# Patient Record
Sex: Female | Born: 1975 | Race: White | Hispanic: Yes | Marital: Single | Smoking: Former smoker
Health system: Southern US, Community
[De-identification: ages and names within clinical notes are randomized; demographics above are authoritative.]

## PROBLEM LIST (undated history)

## (undated) DIAGNOSIS — T783XXA Angioneurotic edema, initial encounter: Secondary | ICD-10-CM

## (undated) DIAGNOSIS — E162 Hypoglycemia, unspecified: Secondary | ICD-10-CM

## (undated) DIAGNOSIS — N200 Calculus of kidney: Secondary | ICD-10-CM

## (undated) DIAGNOSIS — Z9109 Other allergy status, other than to drugs and biological substances: Secondary | ICD-10-CM

## (undated) DIAGNOSIS — G43909 Migraine, unspecified, not intractable, without status migrainosus: Secondary | ICD-10-CM

## (undated) DIAGNOSIS — M545 Low back pain, unspecified: Secondary | ICD-10-CM

## (undated) DIAGNOSIS — E559 Vitamin D deficiency, unspecified: Secondary | ICD-10-CM

## (undated) DIAGNOSIS — E282 Polycystic ovarian syndrome: Secondary | ICD-10-CM

## (undated) DIAGNOSIS — M459 Ankylosing spondylitis of unspecified sites in spine: Secondary | ICD-10-CM

## (undated) DIAGNOSIS — Z9884 Bariatric surgery status: Secondary | ICD-10-CM

## (undated) DIAGNOSIS — E041 Nontoxic single thyroid nodule: Secondary | ICD-10-CM

## (undated) DIAGNOSIS — L509 Urticaria, unspecified: Secondary | ICD-10-CM

## (undated) DIAGNOSIS — K219 Gastro-esophageal reflux disease without esophagitis: Secondary | ICD-10-CM

## (undated) DIAGNOSIS — J45909 Unspecified asthma, uncomplicated: Secondary | ICD-10-CM

## (undated) DIAGNOSIS — F419 Anxiety disorder, unspecified: Secondary | ICD-10-CM

## (undated) DIAGNOSIS — M1990 Unspecified osteoarthritis, unspecified site: Secondary | ICD-10-CM

## (undated) DIAGNOSIS — Z862 Personal history of diseases of the blood and blood-forming organs and certain disorders involving the immune mechanism: Secondary | ICD-10-CM

## (undated) DIAGNOSIS — R7989 Other specified abnormal findings of blood chemistry: Secondary | ICD-10-CM

## (undated) HISTORY — PX: ADENOIDECTOMY: SUR15

## (undated) HISTORY — DX: Angioneurotic edema, initial encounter: T78.3XXA

## (undated) HISTORY — DX: Unspecified asthma, uncomplicated: J45.909

## (undated) HISTORY — DX: Nontoxic single thyroid nodule: E04.1

## (undated) HISTORY — DX: Personal history of diseases of the blood and blood-forming organs and certain disorders involving the immune mechanism: Z86.2

## (undated) HISTORY — DX: Anxiety disorder, unspecified: F41.9

## (undated) HISTORY — DX: Low back pain, unspecified: M54.50

## (undated) HISTORY — DX: Other allergy status, other than to drugs and biological substances: Z91.09

## (undated) HISTORY — DX: Urticaria, unspecified: L50.9

## (undated) HISTORY — PX: TONSILLECTOMY: SUR1361

## (undated) HISTORY — DX: Vitamin D deficiency, unspecified: E55.9

## (undated) HISTORY — DX: Polycystic ovarian syndrome: E28.2

## (undated) HISTORY — DX: Unspecified osteoarthritis, unspecified site: M19.90

## (undated) HISTORY — DX: Ankylosing spondylitis of unspecified sites in spine: M45.9

## (undated) HISTORY — DX: Hypoglycemia, unspecified: E16.2

## (undated) HISTORY — DX: Calculus of kidney: N20.0

## (undated) HISTORY — DX: Migraine, unspecified, not intractable, without status migrainosus: G43.909

## (undated) HISTORY — DX: Other specified abnormal findings of blood chemistry: R79.89

## (undated) HISTORY — DX: Gastro-esophageal reflux disease without esophagitis: K21.9

## (undated) HISTORY — DX: Bariatric surgery status: Z98.84

## (undated) HISTORY — PX: GASTRIC BYPASS: SHX52

---
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
## 1999-03-09 HISTORY — PX: KNEE SURGERY: SHX244

## 2004-03-08 DIAGNOSIS — G932 Benign intracranial hypertension: Secondary | ICD-10-CM

## 2004-03-08 HISTORY — DX: Benign intracranial hypertension: G93.2

## 2012-03-08 HISTORY — PX: GASTRIC BYPASS: SHX52

## 2013-03-08 HISTORY — PX: COSMETIC SURGERY: SHX468

## 2018-10-07 HISTORY — PX: TOTAL ABDOMINAL HYSTERECTOMY: SHX209

## 2018-10-07 HISTORY — PX: ABDOMINAL HYSTERECTOMY: SHX81

## 2019-03-09 HISTORY — PX: LITHOTRIPSY: SUR834

## 2019-11-28 ENCOUNTER — Encounter: Payer: Self-pay | Admitting: Family Medicine

## 2019-11-28 ENCOUNTER — Other Ambulatory Visit: Payer: Self-pay

## 2019-11-28 ENCOUNTER — Ambulatory Visit (INDEPENDENT_AMBULATORY_CARE_PROVIDER_SITE_OTHER): Payer: Medicare Other | Admitting: Family Medicine

## 2019-11-28 VITALS — BP 105/73 | HR 73 | Temp 96.0°F | Ht 64.0 in | Wt 249.6 lb

## 2019-11-28 DIAGNOSIS — E041 Nontoxic single thyroid nodule: Secondary | ICD-10-CM

## 2019-11-28 DIAGNOSIS — K219 Gastro-esophageal reflux disease without esophagitis: Secondary | ICD-10-CM

## 2019-11-28 DIAGNOSIS — M545 Low back pain, unspecified: Secondary | ICD-10-CM

## 2019-11-28 DIAGNOSIS — J4520 Mild intermittent asthma, uncomplicated: Secondary | ICD-10-CM

## 2019-11-28 DIAGNOSIS — F411 Generalized anxiety disorder: Secondary | ICD-10-CM

## 2019-11-28 DIAGNOSIS — E559 Vitamin D deficiency, unspecified: Secondary | ICD-10-CM

## 2019-11-28 DIAGNOSIS — F1290 Cannabis use, unspecified, uncomplicated: Secondary | ICD-10-CM

## 2019-11-28 DIAGNOSIS — M459 Ankylosing spondylitis of unspecified sites in spine: Secondary | ICD-10-CM | POA: Diagnosis not present

## 2019-11-28 DIAGNOSIS — Z9109 Other allergy status, other than to drugs and biological substances: Secondary | ICD-10-CM

## 2019-11-28 DIAGNOSIS — N941 Unspecified dyspareunia: Secondary | ICD-10-CM

## 2019-11-28 DIAGNOSIS — E282 Polycystic ovarian syndrome: Secondary | ICD-10-CM

## 2019-11-28 DIAGNOSIS — R7989 Other specified abnormal findings of blood chemistry: Secondary | ICD-10-CM

## 2019-11-28 DIAGNOSIS — R232 Flushing: Secondary | ICD-10-CM

## 2019-11-28 DIAGNOSIS — G43109 Migraine with aura, not intractable, without status migrainosus: Secondary | ICD-10-CM

## 2019-11-28 MED ORDER — FLUOXETINE HCL 20 MG PO CAPS: 20.0000 mg | ORAL_CAPSULE | Freq: Every day | ORAL | 2 refills | Status: DC

## 2019-11-28 MED ORDER — NARATRIPTAN HCL 2.5 MG PO TABS: 2.5000 mg | ORAL_TABLET | ORAL | 1 refills | Status: DC | PRN
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
## 2019-11-28 NOTE — Patient Instructions (Addendum)
Decrease Lexapro to 10 mg once daily x1 week, then stop it and start Prozac 20 mg once daily.   [employer] or [employer] for hot flashes.

## 2019-11-29 LAB — CMP14+EGFR
ALT: 142 IU/L — ABNORMAL HIGH (ref 0–32)
AST: 46 IU/L — ABNORMAL HIGH (ref 0–40)
Albumin/Globulin Ratio: 1.3 (ref 1.2–2.2)
Albumin: 3.8 g/dL (ref 3.8–4.8)
Alkaline Phosphatase: 180 IU/L — ABNORMAL HIGH (ref 44–121)
BUN/Creatinine Ratio: 15 (ref 9–23)
BUN: 11 mg/dL (ref 6–24)
Bilirubin Total: 0.6 mg/dL (ref 0.0–1.2)
CO2: 21 mmol/L (ref 20–29)
Calcium: 8.8 mg/dL (ref 8.7–10.2)
Chloride: 106 mmol/L (ref 96–106)
Creatinine, Ser: 0.75 mg/dL (ref 0.57–1.00)
GFR calc Af Amer: 114 mL/min/{1.73_m2} (ref >59)
GFR calc non Af Amer: 99 mL/min/{1.73_m2} (ref >59)
Globulin, Total: 2.9 g/dL (ref 1.5–4.5)
Glucose: 75 mg/dL (ref 65–99)
Potassium: 4.1 mmol/L (ref 3.5–5.2)
Sodium: 140 mmol/L (ref 134–144)
Total Protein: 6.7 g/dL (ref 6.0–8.5)

## 2019-11-29 LAB — THYROID PANEL WITH TSH
Free Thyroxine Index: 1.6 (ref 1.2–4.9)
T3 Uptake Ratio: 27 % (ref 24–39)
T4, Total: 6.1 ug/dL (ref 4.5–12.0)
TSH: 2.64 u[IU]/mL (ref 0.450–4.500)

## 2019-11-29 LAB — CBC WITH DIFFERENTIAL/PLATELET
Basophils Absolute: 0 10*3/uL (ref 0.0–0.2)
Basos: 0 %
EOS (ABSOLUTE): 0.1 10*3/uL (ref 0.0–0.4)
Eos: 2 %
Hematocrit: 45.6 % (ref 34.0–46.6)
Hemoglobin: 14.4 g/dL (ref 11.1–15.9)
Immature Grans (Abs): 0 10*3/uL (ref 0.0–0.1)
Immature Granulocytes: 0 %
Lymphocytes Absolute: 2.4 10*3/uL (ref 0.7–3.1)
Lymphs: 36 %
MCH: 28.2 pg (ref 26.6–33.0)
MCHC: 31.6 g/dL (ref 31.5–35.7)
MCV: 89 fL (ref 79–97)
Monocytes Absolute: 0.4 10*3/uL (ref 0.1–0.9)
Monocytes: 7 %
Neutrophils Absolute: 3.6 10*3/uL (ref 1.4–7.0)
Neutrophils: 55 %
Platelets: 208 10*3/uL (ref 150–450)
RBC: 5.11 x10E6/uL (ref 3.77–5.28)
RDW: 13.9 % (ref 11.7–15.4)
WBC: 6.5 10*3/uL (ref 3.4–10.8)

## 2019-11-29 LAB — LIPID PANEL
Chol/HDL Ratio: 2.2 ratio (ref 0.0–4.4)
Cholesterol, Total: 142 mg/dL (ref 100–199)
HDL: 64 mg/dL (ref >39)
LDL Chol Calc (NIH): 64 mg/dL (ref 0–99)
Triglycerides: 68 mg/dL (ref 0–149)
VLDL Cholesterol Cal: 14 mg/dL (ref 5–40)

## 2019-11-29 LAB — VITAMIN D 25 HYDROXY (VIT D DEFICIENCY, FRACTURES): Vit D, 25-Hydroxy: 32 ng/mL (ref 30.0–100.0)

## 2019-12-04 ENCOUNTER — Encounter: Payer: Self-pay | Admitting: Family

## 2019-12-04 ENCOUNTER — Other Ambulatory Visit: Payer: Self-pay

## 2019-12-04 ENCOUNTER — Ambulatory Visit (INDEPENDENT_AMBULATORY_CARE_PROVIDER_SITE_OTHER): Payer: Medicare Other | Admitting: Family

## 2019-12-04 VITALS — BP 109/76 | HR 76 | Temp 97.7°F | Ht 64.0 in | Wt 249.2 lb

## 2019-12-04 DIAGNOSIS — R21 Rash and other nonspecific skin eruption: Secondary | ICD-10-CM | POA: Diagnosis not present

## 2019-12-04 DIAGNOSIS — B351 Tinea unguium: Secondary | ICD-10-CM | POA: Diagnosis not present

## 2019-12-04 DIAGNOSIS — L439 Lichen planus, unspecified: Secondary | ICD-10-CM

## 2019-12-04 MED ORDER — PANTOPRAZOLE SODIUM 40 MG PO TBEC: 40.0000 mg | DELAYED_RELEASE_TABLET | Freq: Every day | ORAL | 3 refills | Status: DC

## 2019-12-04 MED ORDER — CLOBETASOL PROPIONATE 0.05 % EX OINT: 1.0000 "application " | TOPICAL_OINTMENT | Freq: Two times a day (BID) | CUTANEOUS | 2 refills | Status: DC

## 2019-12-04 NOTE — Patient Instructions (Signed)
Lichen Planus Lichen planus is a skin problem that causes redness, itching, small bumps, and sores. It can affect the skin in any area of the body. Some common areas affected include:  Arms, wrists, legs, or ankles.  Chest, back, or abdomen.  Genital areas such as the vulva and vagina.  Gums and inside of the mouth.  Scalp.  Fingernails or toenails. Treatment can help control the symptoms of this condition. The condition can last for a long time. It can take 6-18 months or longer for it to go away. What are the causes? The exact cause of this condition is not known. The condition is not passed from one person to another (not contagious). It may be related to an allergy, a medicine, or an autoimmune response. An autoimmune response occurs when the body's defense system (immune system) mistakenly attacks healthy tissues. What increases the risk? The following factors may make you more likely to develop this condition:  Being older than 42 years of age.  Taking certain medicines.  Having been exposed to certain dyes or chemicals.  Having hepatitis C.  Being a woman. What are the signs or symptoms? Symptoms of this condition may include:  Itching, which can be severe.  Small reddish or purple bumps on the skin. These may have flat tops and may be round or irregular shaped.  Redness or white patches on the gums or tongue.  Redness, soreness, or a burning feeling in the genital area. This may lead to pain or bleeding during sex.  Changes in the fingernails or toenails. The nails may become thin or rough. They may have ridges in them.  Redness or irritation of the eyes. This is rare. How is this diagnosed? This condition may be diagnosed based on:  A physical exam. The health care provider will examine your affected skin and check for changes inside your mouth.  Removal of a tissue sample (biopsy sample) to be looked at under a microscope. How is this treated? Treatment for  this condition may depend on the severity of symptoms. In some cases, no treatment is needed. If treatment is needed to control symptoms, it may include:  Creams or ointments (topical steroids) to help control itching and irritation.  Medicine to be taken by mouth.  Medicine to be taken by injection.  A treatment in which your skin is exposed to ultraviolet light (phototherapy).  Lozenges that you suck on to help treat sores in the mouth. Follow these instructions at home:   Take or use over-the-counter and prescription medicines only as told by your health care provider.  Use creams or ointments as told by your health care provider.  Do not scratch the affected areas of skin.  If you are a woman, be sure to keep the vaginal area as clean and dry as possible.  If you have sores in your mouth, avoid spicy and acidic foods as well as alcohol and tobacco.  Keep all follow-up visits as told by your health care provider. This is important. Contact a health care provider if:  You have increasing redness, swelling, or pain in the affected area.  You have fluid, blood, or pus coming from the affected area.  Your eyes become irritated. Summary  Lichen planus is a skin problem that causes redness, itching, small bumps, and sores. It can affect the skin in any area of the body.  Do not scratch the affected areas of skin. Keep the affected area of skin clean.  Take or use  over-the-counter and prescription medicines only as told by your health care provider.  Contact a health care provider if you have drainage from the affected area or have increasing redness, swelling, or pain in the area.  Keep all follow-up visits as told by your health care provider. This is important. This information is not intended to replace advice given to you by your health care provider. Make sure you discuss any questions you have with your health care provider. Document Revised: 07/06/2017 Document  Reviewed: 07/06/2017 Elsevier Patient Education  [place].

## 2019-12-04 NOTE — Progress Notes (Signed)
Subjective:    Patient ID: Stephanie Dyer, female    DOB: 1975/10/09, 44 y.o.   MRN: [id number]  Chief Complaint  Patient presents with  . Rash    on pelvic area, has tried otc antifoungle cream with no relief   . Nail Problem   Pt presents to the office today with rash in her groin and inner thighs that she noticed several weeks ago. She reports intense itching, but no pain,   She also report thick great toenails for years. She has tried oral Terbinafine without relief.    Rash This is a new problem. The current episode started 1 to 4 weeks ago. The affected locations include the groin (inner thighs). The rash is characterized by itchiness and redness. She was exposed to nothing. Pertinent negatives include no facial edema, fatigue, rhinorrhea or shortness of breath. Past treatments include antibiotics and anti-itch cream (anti fungal ). The treatment provided mild relief.      Review of Systems  Constitutional: Negative for fatigue.  HENT: Negative for rhinorrhea.   Respiratory: Negative for shortness of breath.   Skin: Positive for rash.  All other systems reviewed and are negative.      Objective:   Physical Exam Vitals reviewed.  Constitutional:      General: She is not in acute distress.    Appearance: She is well-developed.  HENT:     Head: Normocephalic and atraumatic.  Eyes:     Pupils: Pupils are equal, round, and reactive to light.  Neck:     Thyroid: No thyromegaly.  Cardiovascular:     Rate and Rhythm: Normal rate and regular rhythm.     Heart sounds: Normal heart sounds. No murmur heard.   Pulmonary:     Effort: Pulmonary effort is normal. No respiratory distress.     Breath sounds: Normal breath sounds. No wheezing.  Abdominal:     General: Bowel sounds are normal. There is no distension.     Palpations: Abdomen is soft.     Tenderness: There is no abdominal tenderness.  Musculoskeletal:        General: No tenderness. Normal range of motion.      Cervical back: Normal range of motion and neck supple.  Skin:    General: Skin is warm and dry.     Findings: Rash present.     Comments: Small flat, circular white patches in bilateral groin   Neurological:     Mental Status: She is alert and oriented to person, place, and time.     Cranial Nerves: No cranial nerve deficit.     Deep Tendon Reflexes: Reflexes are normal and symmetric.  Psychiatric:        Behavior: Behavior normal.        Thought Content: Thought content normal.        Judgment: Judgment normal.       BP 109/76   Pulse 76   Temp 97.7 F (36.5 C) (Temporal)   Ht 5\' 4"  (1.626 m)   Wt 249 lb 3.2 oz (113 kg)   LMP 11/04/2018   BMI 42.78 kg/m      Assessment & Plan:  Stephanie Dyer comes in today with chief complaint of Rash (on pelvic area, has tried otc antifungal cream with no relief ) and Nail Problem   Diagnosis and orders addressed:  1. Rash and nonspecific skin eruption  2. Lichen planus Looks like Lichen planus, will start clobetasol BID  Avoid scratching Good RX discussed  if costs is too much - clobetasol ointment (TEMOVATE) 0.05 %; Apply 1 application topically 2 (two) times daily.  Dispense: 60 g; Refill: 2  3. Toenail fungus - Ambulatory referral to [place], FNP

## 2019-12-06 ENCOUNTER — Encounter: Payer: Self-pay | Admitting: Family Medicine

## 2019-12-06 DIAGNOSIS — E559 Vitamin D deficiency, unspecified: Secondary | ICD-10-CM | POA: Insufficient documentation

## 2019-12-06 DIAGNOSIS — F419 Anxiety disorder, unspecified: Secondary | ICD-10-CM | POA: Insufficient documentation

## 2019-12-06 DIAGNOSIS — J45909 Unspecified asthma, uncomplicated: Secondary | ICD-10-CM | POA: Insufficient documentation

## 2019-12-06 DIAGNOSIS — E041 Nontoxic single thyroid nodule: Secondary | ICD-10-CM | POA: Insufficient documentation

## 2019-12-06 DIAGNOSIS — E282 Polycystic ovarian syndrome: Secondary | ICD-10-CM | POA: Insufficient documentation

## 2019-12-06 DIAGNOSIS — N941 Unspecified dyspareunia: Secondary | ICD-10-CM | POA: Insufficient documentation

## 2019-12-06 DIAGNOSIS — K219 Gastro-esophageal reflux disease without esophagitis: Secondary | ICD-10-CM | POA: Insufficient documentation

## 2019-12-06 DIAGNOSIS — M459 Ankylosing spondylitis of unspecified sites in spine: Secondary | ICD-10-CM | POA: Insufficient documentation

## 2019-12-06 DIAGNOSIS — F411 Generalized anxiety disorder: Secondary | ICD-10-CM | POA: Insufficient documentation

## 2019-12-06 DIAGNOSIS — M545 Low back pain, unspecified: Secondary | ICD-10-CM | POA: Insufficient documentation

## 2019-12-06 DIAGNOSIS — R7989 Other specified abnormal findings of blood chemistry: Secondary | ICD-10-CM | POA: Insufficient documentation

## 2019-12-06 DIAGNOSIS — G43909 Migraine, unspecified, not intractable, without status migrainosus: Secondary | ICD-10-CM | POA: Insufficient documentation

## 2019-12-06 DIAGNOSIS — Z9109 Other allergy status, other than to drugs and biological substances: Secondary | ICD-10-CM | POA: Insufficient documentation

## 2019-12-06 DIAGNOSIS — R232 Flushing: Secondary | ICD-10-CM | POA: Insufficient documentation

## 2019-12-09 ENCOUNTER — Encounter: Payer: Self-pay | Admitting: Family Medicine

## 2019-12-09 DIAGNOSIS — F1290 Cannabis use, unspecified, uncomplicated: Secondary | ICD-10-CM | POA: Insufficient documentation

## 2019-12-09 DIAGNOSIS — L680 Hirsutism: Secondary | ICD-10-CM

## 2019-12-10 ENCOUNTER — Encounter: Payer: Self-pay | Admitting: Internal Medicine

## 2019-12-14 ENCOUNTER — Telehealth: Payer: Self-pay | Admitting: *Deleted

## 2019-12-14 ENCOUNTER — Encounter: Payer: Self-pay | Admitting: Family Medicine

## 2019-12-14 NOTE — Telephone Encounter (Signed)
Can we please ask patient if she has tried the others? She came from a neurologist in [place] and I do not have her records yet.

## 2019-12-14 NOTE — Telephone Encounter (Signed)
NARATRIPTAN HCL 2.5MG  TABLET-Non Preferred   Preferred meds -rizatriptan ODT -rizatriptan tablet -sumatriptan

## 2019-12-14 NOTE — Telephone Encounter (Signed)
I tried contacting the patient to ask and unfortunately the phone number we have for her is not working.

## 2019-12-18 NOTE — Telephone Encounter (Signed)
I sent patient a MyChart message 4 days ago since the number in her chart does not work, but have yet to hear back from her.

## 2019-12-27 ENCOUNTER — Ambulatory Visit (INDEPENDENT_AMBULATORY_CARE_PROVIDER_SITE_OTHER): Payer: Medicare Other | Admitting: Nurse Practitioner

## 2019-12-27 DIAGNOSIS — H6505 Acute serous otitis media, recurrent, left ear: Secondary | ICD-10-CM | POA: Diagnosis not present

## 2019-12-27 DIAGNOSIS — G43109 Migraine with aura, not intractable, without status migrainosus: Secondary | ICD-10-CM | POA: Diagnosis not present

## 2019-12-27 MED ORDER — FLUCONAZOLE 150 MG PO TABS: 150.0000 mg | ORAL_TABLET | Freq: Once | ORAL | 0 refills | Status: AC

## 2019-12-27 MED ORDER — TOPIRAMATE 25 MG PO CPSP: 75.0000 mg | ORAL_CAPSULE | Freq: Every evening | ORAL | 3 refills | Status: DC

## 2019-12-27 MED ORDER — AMOXICILLIN-POT CLAVULANATE 875-125 MG PO TABS: 1.0000 | ORAL_TABLET | Freq: Two times a day (BID) | ORAL | 0 refills | Status: DC

## 2019-12-27 NOTE — Progress Notes (Signed)
   Virtual Visit via telephone Note Due to COVID-19 pandemic this visit was conducted virtually. This visit type was conducted due to national recommendations for restrictions regarding the COVID-19 Pandemic (e.g. social distancing, sheltering in place) in an effort to limit this patient's exposure and mitigate transmission in our community. All issues noted in this document were discussed and addressed.  A physical exam was not performed with this format.  I connected with Stephanie Dyer on 12/27/19 at 11:10 by telephone and verified that I am speaking with the correct person using two identifiers. Stephanie Dyer is currently located at home and no one is currently with her during visit. The provider, Mary-Margaret Hassell Done, FNP is located in their office at time of visit.  I discussed the limitations, risks, security and privacy concerns of performing an evaluation and management service by telephone and the availability of in person appointments. I also discussed with the patient that there may be a patient responsible charge related to this service. The patient expressed understanding and agreed to proceed.   History and Present Illness:   Chief Complaint: Ear Pain   HPI Patient calls in stating that she has another ear infection. Sharp pain in ear. She gets frequent ear infections. She denies any drainage. Has never seen ENMT. Says she typically gets 3-4 ear infections yearly.  * needs topamax filled- was not filled at last visit  Review of Systems  Constitutional: Negative for fever.  HENT: Positive for ear pain. Negative for ear discharge.   Respiratory: Negative.   Musculoskeletal: Negative.   Neurological: Negative.   Psychiatric/Behavioral: Negative.      Observations/Objective: Alert and oriented- answers all questions appropriately No distress    Assessment and Plan: Stephanie Dyer in today with chief complaint of Ear Pain   1. Recurrent acute serous otitis media of  left ear Do not stick anything in ear Avoid getting water in ear - Ambulatory referral to ENT - amoxicillin-clavulanate (AUGMENTIN) 875-125 MG tablet; Take 1 tablet by mouth 2 (two) times daily.  Dispense: 20 tablet; Refill: 0 - fluconazole (DIFLUCAN) 150 MG tablet; Take 1 tablet (150 mg total) by mouth once for 1 dose.  Dispense: 1 tablet; Refill: 0  2. Migraine with aura and without status migrainosus, not intractable Refilled meds for PCP - topiramate (TOPAMAX) 25 MG capsule; Take 3 capsules (75 mg total) by mouth at bedtime.  Dispense: 90 capsule; Refill: 3     Follow Up Instructions: prn    I discussed the assessment and treatment plan with the patient. The patient was provided an opportunity to ask questions and all were answered. The patient agreed with the plan and demonstrated an understanding of the instructions.   The patient was advised to call back or seek an in-person evaluation if the symptoms worsen or if the condition fails to improve as anticipated.  The above assessment and management plan was discussed with the patient. The patient verbalized understanding of and has agreed to the management plan. Patient is aware to call the clinic if symptoms persist or worsen. Patient is aware when to return to the clinic for a follow-up visit. Patient educated on when it is appropriate to go to the emergency department.   Time call ended: 11:25   I provided 15 minutes of non-face-to-face time during this encounter.    Mary-Margaret Hassell Done, FNP

## 2020-01-03 ENCOUNTER — Telehealth: Payer: Self-pay | Admitting: *Deleted

## 2020-01-03 DIAGNOSIS — G43109 Migraine with aura, not intractable, without status migrainosus: Secondary | ICD-10-CM

## 2020-01-03 MED ORDER — TOPIRAMATE 25 MG PO CPSP: 75.0000 mg | ORAL_CAPSULE | Freq: Every evening | ORAL | 1 refills | Status: DC

## 2020-01-03 NOTE — Addendum Note (Signed)
Addended by: Chevis Pretty on: 01/03/2020 11:20 AM   Modules accepted: Orders

## 2020-01-03 NOTE — Telephone Encounter (Signed)
Fax from [facility] [facility] Re: Topamax Spr 25 mg cap Note from pharmacy: pt was on tablet form previously  Pt started with practice on 11/28/19, first Rxd by [place] on 12/27/19 Please advise if change is appropriate send in new script

## 2020-01-04 ENCOUNTER — Ambulatory Visit: Payer: Medicare Other | Admitting: Family Medicine

## 2020-01-09 ENCOUNTER — Encounter: Payer: Self-pay | Admitting: *Deleted

## 2020-01-09 ENCOUNTER — Encounter: Payer: Self-pay | Admitting: Internal Medicine

## 2020-01-09 ENCOUNTER — Other Ambulatory Visit: Payer: Self-pay

## 2020-01-09 ENCOUNTER — Ambulatory Visit (INDEPENDENT_AMBULATORY_CARE_PROVIDER_SITE_OTHER): Payer: Medicare Other | Admitting: Internal Medicine

## 2020-01-09 VITALS — BP 108/71 | HR 85 | Temp 96.8°F | Ht 64.0 in | Wt 239.4 lb

## 2020-01-09 DIAGNOSIS — R1319 Other dysphagia: Secondary | ICD-10-CM | POA: Diagnosis not present

## 2020-01-09 DIAGNOSIS — R945 Abnormal results of liver function studies: Secondary | ICD-10-CM

## 2020-01-09 DIAGNOSIS — R194 Change in bowel habit: Secondary | ICD-10-CM

## 2020-01-09 DIAGNOSIS — K219 Gastro-esophageal reflux disease without esophagitis: Secondary | ICD-10-CM | POA: Diagnosis not present

## 2020-01-09 DIAGNOSIS — R195 Other fecal abnormalities: Secondary | ICD-10-CM

## 2020-01-09 DIAGNOSIS — R7989 Other specified abnormal findings of blood chemistry: Secondary | ICD-10-CM

## 2020-01-09 NOTE — Progress Notes (Signed)
Primary Care Physician:  Loman Brooklyn, FNP Primary Gastroenterologist:  Dr. Abbey Chatters  Chief Complaint  Patient presents with  . Elevated Hepatic Enzymes    x1 year    HPI:   Stephanie Dyer is a 44 y.o. female who presents to the clinic today by referral from her PCP Delight Ovens for evaluations for abnormal LFTs.  Patient states she has been dealing with this for years now..  She had a full serological work-up at [facility] previously.  She is showing me the lab reports on her phone.  Appears her hepatitis B, hepatitis C serologies were negative.  ANA, AMA, A1 A, immunoglobulins, ferritin all within normal limits.  She had a biopsy performed but I do not have access to the pathology report.  Patient does not drink alcohol.  No history of drug use.  No family history of liver disease.  Does not appear that she is on any chronic medications that could cause hepatitis.  From a GI standpoint, she is also complained of chronic reflux with breakthrough symptoms.  Also notes occasional dysphagia primarily with solids getting stuck in her substernal region.  Currently takes Protonix 40 mg daily which helps some.  She is status post gastric bypass in 2014.  Also notes change in bowel habits.  Notes predominantly loose stools on a daily basis.  Does have intermittent constipation as well.  No melena or hematochezia.  No unintentional weight loss.  No family history of colorectal malignancy.  No previous colonoscopy  Past Medical History:  Diagnosis Date  . Ankylosing spondylitis ([place])   . Anxiety   . Arthritis   . Asthma   . Cervical cancer ([place]) 1998  . Elevated LFTs   . Environmental allergies   . GERD (gastroesophageal reflux disease)   . Hypoglycemia   . Kidney stones   . Lumbar back pain   . Migraines   . PCOS (polycystic ovarian syndrome)   . Thyroid cyst   . Vitamin D deficiency     Past Surgical History:  Procedure Laterality Date  . ABDOMINAL HYSTERECTOMY  10/2018  .  CESAREAN SECTION  2016  . CHOLECYSTECTOMY  1998  . COSMETIC SURGERY  2015  . GASTRIC BYPASS  2014  . KNEE SURGERY Left 2001  . LITHOTRIPSY  2021  . TONSILLECTOMY      Current Outpatient Medications  Medication Sig Dispense Refill  . albuterol (VENTOLIN HFA) 108 (90 Base) MCG/ACT inhaler Inhale 2 puffs into the lungs every 6 (six) hours as needed.    . clobetasol ointment (TEMOVATE) 1.61 % Apply 1 application topically 2 (two) times daily. 60 g 2  . cyclobenzaprine (FLEXERIL) 5 MG tablet Take 5 mg by mouth 3 (three) times daily as needed for muscle spasms.    Marland Kitchen ELDERBERRY PO Take by mouth.    . EPINEPHrine 0.3 mg/0.3 mL IJ SOAJ injection Inject 0.3 mg into the muscle as needed.    . ergocalciferol (VITAMIN D2) 1.25 MG (50000 UT) capsule Take 50,000 Units by mouth once a week.    Marland Kitchen FLUoxetine (PROZAC) 20 MG capsule Take 1 capsule (20 mg total) by mouth daily. 30 capsule 2  . Multiple Vitamins-Minerals (AIRBORNE PO) Take by mouth.    . Multiple Vitamins-Minerals (WOMENS MULTI GUMMIES) CHEW Chew by mouth.    . naratriptan (AMERGE) 2.5 MG tablet Take 1 tablet (2.5 mg total) by mouth as needed for migraine. Take one (1) tablet at onset of headache; if returns or does not  resolve, may repeat after 4 hours; do not exceed five (5) mg in 24 hours. 10 tablet 1  . OVER THE COUNTER MEDICATION Power Zinc gummy    . OVER THE COUNTER MEDICATION Power C gummy    . pantoprazole (PROTONIX) 40 MG tablet Take 1 tablet (40 mg total) by mouth daily. 90 tablet 3  . topiramate (TOPAMAX) 25 MG capsule Take 3 capsules (75 mg total) by mouth at bedtime. 90 capsule 1  . amoxicillin-clavulanate (AUGMENTIN) 875-125 MG tablet Take 1 tablet by mouth 2 (two) times daily. (Patient not taking: Reported on 01/09/2020) 20 tablet 0  . montelukast (SINGULAIR) 10 MG tablet Take 10 mg by mouth at bedtime. (Patient not taking: Reported on 01/09/2020)     No current facility-administered medications for this visit.    Allergies as  of 01/09/2020 - Review Complete 01/09/2020  Allergen Reaction Noted  . Codeine Anaphylaxis 10/06/2019  . Morphine Anaphylaxis 10/06/2019  . Other Anaphylaxis 10/06/2019    Family History  Problem Relation Age of Onset  . Alcohol abuse Mother   . Diabetes Mother   . COPD Mother   . Emphysema Mother   . Heart disease Father   . Heart attack Father   . CVA Brother   . Diabetes Brother   . Hypertension Brother   . Autism Son   . Hearing loss Son   . Diabetes Maternal Grandmother   . Parkinson's disease Maternal Grandmother   . Macular degeneration Maternal Grandmother   . Thyroid cancer Maternal Grandmother   . Stomach cancer Maternal Grandmother   . Cervical cancer Maternal Grandmother   . Suicidality Maternal Grandfather     Social History   Socioeconomic History  . Marital status: Married    Spouse name: Not on file  . Number of children: Not on file  . Years of education: Not on file  . Highest education level: Not on file  Occupational History  . Not on file  Tobacco Use  . Smoking status: Former Smoker    Types: Cigarettes    Quit date: 11/27/2012    Years since quitting: 7.1  . Smokeless tobacco: Never Used  Vaping Use  . Vaping Use: Never used  Substance and Sexual Activity  . Alcohol use: Not Currently  . Drug use: Yes    Types: Marijuana  . Sexual activity: Yes    Birth control/protection: Other-see comments  Other Topics Concern  . Not on file  Social History Narrative  . Not on file   Social Determinants of Health   Financial Resource Strain:   . Difficulty of Paying Living Expenses: Not on file  Food Insecurity:   . Worried About Charity fundraiser in the Last Year: Not on file  . Ran Out of Food in the Last Year: Not on file  Transportation Needs:   . Lack of Transportation (Medical): Not on file  . Lack of Transportation (Non-Medical): Not on file  Physical Activity:   . Days of Exercise per Week: Not on file  . Minutes of Exercise per  Session: Not on file  Stress:   . Feeling of Stress : Not on file  Social Connections:   . Frequency of Communication with Friends and Family: Not on file  . Frequency of Social Gatherings with Friends and Family: Not on file  . Attends Religious Services: Not on file  . Active Member of Clubs or Organizations: Not on file  . Attends Archivist Meetings: Not on  file  . Marital Status: Not on file  Intimate Partner Violence:   . Fear of Current or Ex-Partner: Not on file  . Emotionally Abused: Not on file  . Physically Abused: Not on file  . Sexually Abused: Not on file    Subjective: Review of Systems  Constitutional: Negative for chills and fever.  HENT: Negative for congestion and hearing loss.   Eyes: Negative for blurred vision and double vision.  Respiratory: Negative for cough and shortness of breath.   Cardiovascular: Negative for chest pain and palpitations.  Gastrointestinal: Positive for diarrhea and heartburn. Negative for abdominal pain, blood in stool, constipation, melena and vomiting.       Dysphagia  Genitourinary: Negative for dysuria and urgency.  Musculoskeletal: Negative for joint pain and myalgias.  Skin: Negative for itching and rash.  Neurological: Negative for dizziness and headaches.  Psychiatric/Behavioral: Negative for depression. The patient is not nervous/anxious.        Objective: BP 108/71   Pulse 85   Temp (!) 96.8 F (36 C) (Temporal)   Ht 5\' 4"  (1.626 m)   Wt 239 lb 6.4 oz (108.6 kg)   LMP 11/04/2018   BMI 41.09 kg/m  Physical Exam Constitutional:      Appearance: Normal appearance. She is obese.  HENT:     Head: Normocephalic and atraumatic.  Eyes:     Extraocular Movements: Extraocular movements intact.     Conjunctiva/sclera: Conjunctivae normal.  Cardiovascular:     Rate and Rhythm: Normal rate and regular rhythm.  Pulmonary:     Effort: Pulmonary effort is normal.     Breath sounds: Normal breath sounds.    Abdominal:     General: Bowel sounds are normal.     Palpations: Abdomen is soft.  Musculoskeletal:        General: No swelling. Normal range of motion.     Cervical back: Normal range of motion and neck supple.  Skin:    General: Skin is warm and dry.     Coloration: Skin is not jaundiced.  Neurological:     General: No focal deficit present.     Mental Status: She is alert and oriented to person, place, and time.  Psychiatric:        Mood and Affect: Mood normal.        Behavior: Behavior normal.      Assessment: *Abnormal LFTs-etiology unclear *Chronic reflux-breakthrough symptoms on daily Protonix *Change in bowel habits-primarily diarrhea *Dysphagia  Plan: Etiology of patient's abnormal LFTs unclear.  I will request for liver biopsy results as well as her last GI note from [facility] gastroenterology.  Appears that her serological work-up has been negative including negative hepatitis B and C serologies, ANA, AMA, immunoglobulins, A1A, iron studies.  She does have risk factors for fatty liver disease including obesity.  Further recommendations to follow after I have received her previous reports.  For her chronic reflux with dysphagia, Will schedule for EGD to evaluate for peptic ulcer disease, esophagitis, gastritis, H. Pylori, duodenitis, or other. Will also evaluate for esophageal stricture, Schatzki's ring, esophageal web or other.   At the same time we will perform colonoscopy with random biopsies to rule out underlying inflammatory bowel disease such as ulcerative colitis and Crohn's disease, microscopic colitis given her change in bowel habits and chronic diarrhea.  The risks including infection, bleed, or perforation as well as benefits, limitations, alternatives and imponderables have been reviewed with the patient. Potential for esophageal dilation, biopsy, etc. have  also been reviewed.  Questions have been answered. All parties agreeable.  Patient to follow-up after her  procedures.  Thank you Delight Ovens for the kind 01/09/2020 9:50 AM   Disclaimer: This note was dictated with voice recognition software. Similar sounding words can inadvertently be transcribed and may not be corrected upon review.

## 2020-01-09 NOTE — Patient Instructions (Signed)
To you for EGD to evaluate your chronic reflux, heartburn, and difficulty swallowing.  At the same time we will perform colonoscopy for your change in bowel habits.  I will request records from [facility] in regards to your liver biopsy.  Follow-up after procedures and we will decide what the need to do next  At [facility] we value your feedback. You may receive a survey about your visit today. Please share your experience as we strive to create trusting relationships with our patients to provide genuine, compassionate, quality care.  We appreciate your understanding and patience as we review any laboratory studies, imaging, and other diagnostic tests that are ordered as we care for you. Our office policy is 5 business days for review of these results, and any emergent or urgent results are addressed in a timely manner for your best interest. If you do not hear from our office in 1 week, please contact us.   We also encourage the use of MyChart, which contains your medical information for your review as well. If you are not enrolled in this feature, an access code is on this after visit summary for your convenience. Thank you for allowing [place] to be involved in your care.  It was great to see you today!  I hope you have a great rest of your fall!!    Yahaira Bruski K. Abbey Chatters, D.O. Gastroenterology and Hepatology [facility]

## 2020-01-14 ENCOUNTER — Encounter: Payer: Self-pay | Admitting: Family Medicine

## 2020-01-14 ENCOUNTER — Ambulatory Visit: Payer: Medicare Other | Admitting: Nurse Practitioner

## 2020-01-22 ENCOUNTER — Encounter: Payer: Self-pay | Admitting: Allergy

## 2020-01-22 ENCOUNTER — Ambulatory Visit (INDEPENDENT_AMBULATORY_CARE_PROVIDER_SITE_OTHER): Payer: Medicare Other | Admitting: Allergy

## 2020-01-22 ENCOUNTER — Other Ambulatory Visit: Payer: Self-pay

## 2020-01-22 VITALS — BP 94/70 | HR 77 | Temp 97.1°F | Resp 18 | Ht 63.15 in | Wt 240.0 lb

## 2020-01-22 DIAGNOSIS — T781XXD Other adverse food reactions, not elsewhere classified, subsequent encounter: Secondary | ICD-10-CM

## 2020-01-22 DIAGNOSIS — Z8709 Personal history of other diseases of the respiratory system: Secondary | ICD-10-CM | POA: Diagnosis not present

## 2020-01-22 DIAGNOSIS — T781XXA Other adverse food reactions, not elsewhere classified, initial encounter: Secondary | ICD-10-CM | POA: Insufficient documentation

## 2020-01-22 DIAGNOSIS — Z91038 Other insect allergy status: Secondary | ICD-10-CM | POA: Insufficient documentation

## 2020-01-22 DIAGNOSIS — Z889 Allergy status to unspecified drugs, medicaments and biological substances status: Secondary | ICD-10-CM | POA: Insufficient documentation

## 2020-01-22 DIAGNOSIS — J3089 Other allergic rhinitis: Secondary | ICD-10-CM | POA: Diagnosis not present
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
## 2020-01-22 NOTE — Assessment & Plan Note (Signed)
Hives and swelling after stings in the past. No prior work up.  Continue to avoid.  Get bloodwork.   Carry epinephrine and use for anaphylactic reactions especially when outdoors.

## 2020-01-22 NOTE — Assessment & Plan Note (Signed)
One episode of throat closure with lip turning blue after peanut exposure 2 years ago.  She is not sure of exact events as EMS was called.  She does not recall being administered epinephrine.  Milder similar symptoms with walnuts and pecans.  Tolerates pistachios with no issues.  Seafood causes nausea and diarrhea.  Avoiding fresh fruits due to natural sugars.  Eggs cause nausea, increased gas.  Wheat causes bloating.  Patient is status post gastric bypass surgery.  Today's skin testing showed: Borderline positive to pistachio. Negative to peanuts, milk, egg, seafood.  Results given.   Continue strict avoidance of peanuts and tree nuts.   Okay to eat pistachios as before.  Avoid foods that bother you - seafood, fruits.   Patient still consumes eggs and wheat. Most likely has non-IgE mediated intolerance.  Food allergen skin testing has excellent negative predictive value however there is still a small chance that the allergy exists. Therefore, we will investigate further with serum specific IgE levels for nuts and seafood and, if negative then schedule for open graded oral food challenge.  For mild symptoms you can take over the counter antihistamines such as Benadryl and monitor symptoms closely. If symptoms worsen or if you have severe symptoms including breathing issues, throat closure, significant swelling, whole body hives, severe diarrhea and vomiting, lightheadedness then inject epinephrine and seek immediate medical care afterwards.  Emergency action plan given.

## 2020-01-22 NOTE — Assessment & Plan Note (Addendum)
·   Continue to avoid codeine and morphine.
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
## 2020-01-22 NOTE — Assessment & Plan Note (Signed)
Perennial rhinoconjunctivitis symptoms for 40+ years with worsening in the spring and summer.  Patient moved from [place] to [place] in July 2021.  No worsening symptoms.  Patient was on allergy immunotherapy for 2 years with good benefit.  Requesting records from previous allergist.  Will hold off skin testing and restarting allergy injections at this time as the flora in this region is different than [place] and she has not been exposed to them.   Continue environmental control measures as below.  May use over the counter antihistamines such as Zyrtec (cetirizine), Claritin (loratadine), Allegra (fexofenadine), or Xyzal (levocetirizine) daily as needed. May take twice a day if needed.  May use Flonase (fluticasone) nasal spray 1 spray per nostril twice a day as needed for nasal congestion.  May take Singulair (montelukast) 10mg  daily at night if needed.   Nasal saline spray (i.e., Simply Saline) or nasal saline lavage (i.e., NeilMed) is recommended as needed and prior to medicated nasal sprays.

## 2020-01-22 NOTE — Patient Instructions (Addendum)
I will review records from your previous allergist.  Today's skin testing showed:  Borderline positive to pistachio.   Negative to peanuts, milk, egg, seafood.    Results given.   Environmental allergies  Start environmental control measures as below.  May use over the counter antihistamines such as Zyrtec (cetirizine), Claritin (loratadine), Allegra (fexofenadine), or Xyzal (levocetirizine) daily as needed. May take twice a day if needed.  May use Flonase (fluticasone) nasal spray 1 spray per nostril twice a day as needed for nasal congestion.  May take Singulair (montelukast) 10mg  daily at night if needed.   Nasal saline spray (i.e., Simply Saline) or nasal saline lavage (i.e., NeilMed) is recommended as needed and prior to medicated nasal sprays.  If symptoms worse in [place], we will retest and start allergy injections.    Food:  Continue strict avoidance of peanuts and tree nuts.   Okay to eat pistachios as before.  Avoid foods that bother you - seafood, fruits.  Food allergen skin testing has excellent negative predictive value however there is still a small chance that the allergy exists. Therefore, we will investigate further with serum specific IgE levels for nuts and seafood and, if negative then schedule for open graded oral food challenge.  For mild symptoms you can take over the counter antihistamines such as Benadryl and monitor symptoms closely. If symptoms worsen or if you have severe symptoms including breathing issues, throat closure, significant swelling, whole body hives, severe diarrhea and vomiting, lightheadedness then inject epinephrine and seek immediate medical care afterwards.  Emergency action plan given.   Asthma:  May use albuterol rescue inhaler 2 puffs every 4 to 6 hours as needed for shortness of breath, chest tightness, coughing, and wheezing. May use albuterol rescue inhaler 2 puffs 5 to 15 minutes prior to strenuous physical activities. Monitor  frequency of use.   Drug allergies:  Continue to avoid codeine and morphine.  Bee sting reaction:  Continue to avoid.  Get bloodwork.   Carry epinephrine with you especially when outdoors.   Frequent ear infections:  Follow up with ENT.  Follow up in 6 months or sooner if needed.   Pet Allergen Avoidance: . Contrary to popular opinion, there are no "hypoallergenic" breeds of dogs or cats. That is because people are not allergic to an animal's hair, but to an allergen found in the animal's saliva, dander (dead skin flakes) or urine. Pet allergy symptoms typically occur within minutes. For some people, symptoms can build up and become most severe 8 to 12 hours after contact with the animal. People with severe allergies can experience reactions in public places if dander has been transported on the pet owners' clothing. Marland Kitchen Keeping an animal outdoors is only a partial solution, since homes with pets in the yard still have higher concentrations of animal allergens. . Before getting a pet, ask your allergist to determine if you are allergic to animals. If your pet is already considered part of your family, try to minimize contact and keep the pet out of the bedroom and other rooms where you spend a great deal of time. . As with dust mites, vacuum carpets often or replace carpet with a hardwood floor, tile or linoleum. . High-efficiency particulate air (HEPA) cleaners can reduce allergen levels over time. . While dander and saliva are the source of cat and dog allergens, urine is the source of allergens from rabbits, hamsters, mice and [place] pigs; so ask a non-allergic family member to clean the animal's  cage. . If you have a pet allergy, talk to your allergist about the potential for allergy immunotherapy (allergy shots). This strategy can often provide long-term relief.  Reducing Pollen Exposure . Pollen seasons: trees (spring), grass (summer) and ragweed/weeds (fall). Marland Kitchen Keep windows closed  in your home and car to lower pollen exposure.  Susa Simmonds air conditioning in the bedroom and throughout the house if possible.  . Avoid going out in dry windy days - especially early morning. . Pollen counts are highest between 5 - 10 AM and on dry, hot and windy days.  . Save outside activities for late afternoon or after a heavy rain, when pollen levels are lower.  . Avoid mowing of grass if you have grass pollen allergy. Marland Kitchen Be aware that pollen can also be transported indoors on people and pets.  . Dry your clothes in an automatic dryer rather than hanging them outside where they might collect pollen.  . Rinse hair and eyes before bedtime.

## 2020-01-24 ENCOUNTER — Encounter: Payer: Self-pay | Admitting: Allergy

## 2020-01-24 NOTE — Progress Notes (Signed)
Reviewed notes from her prior physician at an ENT clinic. See scanned reports.  Skin testing positive to cat, feather, grass, English planting, ragweed, weed, trees.  Intradermal testing positive to dust mites, mold, cockroach, dog, mouse, trees.

## 2020-01-31 ENCOUNTER — Emergency Department (HOSPITAL_COMMUNITY)
Admission: EM | Admit: 2020-01-31 | Discharge: 2020-01-31 | Disposition: A | Discharge status: Home / Self Care | Payer: Medicare Other | Attending: Emergency Medicine | Admitting: Emergency Medicine

## 2020-01-31 ENCOUNTER — Emergency Department (HOSPITAL_COMMUNITY): Payer: Medicare Other

## 2020-01-31 ENCOUNTER — Encounter (HOSPITAL_COMMUNITY): Payer: Self-pay

## 2020-01-31 DIAGNOSIS — Z87891 Personal history of nicotine dependence: Secondary | ICD-10-CM | POA: Insufficient documentation

## 2020-01-31 DIAGNOSIS — J040 Acute laryngitis: Secondary | ICD-10-CM | POA: Diagnosis not present

## 2020-01-31 DIAGNOSIS — Z8541 Personal history of malignant neoplasm of cervix uteri: Secondary | ICD-10-CM | POA: Insufficient documentation

## 2020-01-31 DIAGNOSIS — R059 Cough, unspecified: Secondary | ICD-10-CM | POA: Diagnosis present

## 2020-01-31 DIAGNOSIS — J40 Bronchitis, not specified as acute or chronic: Secondary | ICD-10-CM | POA: Insufficient documentation

## 2020-01-31 MED ORDER — AZITHROMYCIN 250 MG PO TABS
500.0000 mg | ORAL_TABLET | Freq: Once | ORAL | Status: AC
  Administered 2020-01-31: 500 mg via ORAL
  Filled 2020-01-31: qty 2

## 2020-01-31 MED ORDER — DEXAMETHASONE 4 MG PO TABS
10.0000 mg | ORAL_TABLET | Freq: Once | ORAL | Status: AC
  Administered 2020-01-31: 10 mg via ORAL
  Filled 2020-01-31: qty 3

## 2020-01-31 MED ORDER — AZITHROMYCIN 250 MG PO TABS: 250.0000 mg | ORAL_TABLET | Freq: Every day | ORAL | 0 refills | Status: AC

## 2020-01-31 NOTE — ED Triage Notes (Signed)
Pt reports that for the past two days she has had a cough and lost her voice, sore throat. Denies fevers. Pt is unvaccinated.

## 2020-01-31 NOTE — ED Provider Notes (Signed)
[facility] EMERGENCY DEPARTMENT Provider Note  CSN: [id number] Arrival date & time: 01/31/20 2563  Chief Complaint(s) Sore Throat  HPI Stephanie Dyer is a 44 y.o. female with a past medical history listed below including asthma and yearly bronchitis who presents to the emergency department for 2 days of dry cough and sore throat.  She reports that this fits her typical yearly pattern of bronchitis followed by laryngitis.  She denies any fevers or chills.  Cough is dry nonproductive.  She is having some cough related chest discomfort but no overt chest pain.  No shortness of breath.  No nausea or vomiting.  No abdominal pain.  No diarrhea.  No urinary symptoms.  No other physical complaints.  She reports that this usually improves with Z-Pak or Bactrim.  HPI  Past Medical History Past Medical History:  Diagnosis Date  . Angio-edema   . Ankylosing spondylitis ([place])   . Anxiety   . Arthritis   . Asthma   . Cervical cancer ([place]) 1998  . Elevated LFTs   . Environmental allergies   . GERD (gastroesophageal reflux disease)   . Hypoglycemia   . Kidney stones   . Lumbar back pain   . Migraines   . PCOS (polycystic ovarian syndrome)   . Thyroid cyst   . Urticaria   . Vitamin D deficiency    Patient Active Problem List   Diagnosis Date Noted  . Other allergic rhinitis 01/22/2020  . Adverse food reaction 01/22/2020  . History of asthma 01/22/2020  . Multiple drug allergies 01/22/2020  . History of systemic reaction to hymenoptera sting 01/22/2020  . Marijuana use 12/09/2019  . Generalized anxiety disorder 12/06/2019  . Hot flashes 12/06/2019  . Pain in female genitalia on intercourse 12/06/2019  . Ankylosing spondylitis ([place])   . Thyroid cyst   . Asthma   . Environmental allergies   . Migraines   . Vitamin D deficiency   . Elevated LFTs   . Lumbar back pain   . Anxiety   . PCOS (polycystic ovarian syndrome)   . GERD (gastroesophageal reflux disease)     Home Medication(s) Prior to Admission medications   Medication Sig Start Date End Date Taking? Authorizing Provider  albuterol (VENTOLIN HFA) 108 (90 Base) MCG/ACT inhaler Inhale 2 puffs into the lungs every 6 (six) hours as needed.    [provider]  azithromycin (ZITHROMAX) 250 MG tablet Take 1 tablet (250 mg total) by mouth daily for 4 days. Take 1 every day until finished. 01/31/20 02/04/20  Fatima Blank, MD  clobetasol ointment (TEMOVATE) 8.93 % Apply 1 application topically 2 (two) times daily. 12/04/19   Sharion Balloon, FNP  ELDERBERRY PO Take by mouth.    [provider]  EPINEPHrine 0.3 mg/0.3 mL IJ SOAJ injection Inject 0.3 mg into the muscle as needed.    [provider]  ergocalciferol (VITAMIN D2) 1.25 MG (50000 UT) capsule Take 50,000 Units by mouth once a week.    [provider]  FLUoxetine (PROZAC) 20 MG capsule Take 1 capsule (20 mg total) by mouth daily. 11/28/19   Loman Brooklyn, FNP  levofloxacin (LEVAQUIN) 500 MG tablet  01/11/20   [provider]  Multiple Vitamins-Minerals (AIRBORNE PO) Take by mouth.    [provider]  Multiple Vitamins-Minerals (WOMENS MULTI GUMMIES) CHEW Chew by mouth.    [provider]  naratriptan (AMERGE) 2.5 MG tablet Take 1 tablet (2.5 mg total) by mouth as needed  for migraine. Take one (1) tablet at onset of headache; if returns or does not resolve, may repeat after 4 hours; do not exceed five (5) mg in 24 hours. 11/28/19   Loman Brooklyn, FNP  OVER THE COUNTER MEDICATION Power Zinc gummy    [provider]  OVER THE COUNTER MEDICATION Power C gummy    [provider]  pantoprazole (PROTONIX) 40 MG tablet Take 1 tablet (40 mg total) by mouth daily. 12/04/19   [name] A, FNP  topiramate (TOPAMAX) 25 MG capsule Take 3 capsules (75 mg total) by mouth at bedtime. 01/03/20   Chevis Pretty, FNP                                                                                                                                     Past Surgical History Past Surgical History:  Procedure Laterality Date  . ABDOMINAL HYSTERECTOMY  10/2018  . ADENOIDECTOMY    . CESAREAN SECTION  2016  . CHOLECYSTECTOMY  1998  . COSMETIC SURGERY  2015  . GASTRIC BYPASS  2014  . GASTRIC BYPASS    . KNEE SURGERY Left 2001  . LITHOTRIPSY  2021  . TONSILLECTOMY     Family History Family History  Problem Relation Age of Onset  . Alcohol abuse Mother   . Diabetes Mother   . COPD Mother   . Emphysema Mother   . Asthma Mother   . Heart disease Father   . Heart attack Father   . CVA Brother   . Diabetes Brother   . Hypertension Brother   . Autism Son   . Hearing loss Son   . Diabetes Maternal Grandmother   . Parkinson's disease Maternal Grandmother   . Macular degeneration Maternal Grandmother   . Thyroid cancer Maternal Grandmother   . Stomach cancer Maternal Grandmother   . Cervical cancer Maternal Grandmother   . Suicidality Maternal Grandfather   . Allergic rhinitis Neg Hx   . Eczema Neg Hx   . Urticaria Neg Hx     Social History Social History   Tobacco Use  . Smoking status: Former Smoker    Types: Cigarettes    Quit date: 11/27/2012    Years since quitting: 7.1  . Smokeless tobacco: Never Used  Vaping Use  . Vaping Use: Never used  Substance Use Topics  . Alcohol use: Not Currently  . Drug use: Yes    Types: Marijuana   Allergies Codeine, Morphine, and Other  Review of Systems Review of Systems All other systems are reviewed and are negative for acute change except as noted in the HPI  Physical Exam Vital Signs  I have reviewed the triage vital signs BP 124/86   Pulse 79   Temp 98.2 F (36.8 C) (Oral)   Resp 16   LMP 11/04/2018   SpO2 99%   Physical Exam Vitals reviewed.  Constitutional:  General: She is not in acute distress.    Appearance: She is well-developed. She is not diaphoretic.  HENT:      Head: Normocephalic and atraumatic.     Nose: Nose normal.     Mouth/Throat:     Pharynx: No pharyngeal swelling or oropharyngeal exudate.     Tonsils: No tonsillar exudate.     Comments: Hoarse voice Eyes:     General: No scleral icterus.       Right eye: No discharge.        Left eye: No discharge.     Conjunctiva/sclera: Conjunctivae normal.     Pupils: Pupils are equal, round, and reactive to light.  Cardiovascular:     Rate and Rhythm: Normal rate and regular rhythm.     Heart sounds: No murmur heard.  No friction rub. No gallop.   Pulmonary:     Effort: Pulmonary effort is normal. No respiratory distress.     Breath sounds: Normal breath sounds. No stridor. No rales.  Abdominal:     General: There is no distension.     Palpations: Abdomen is soft.     Tenderness: There is no abdominal tenderness.  Musculoskeletal:        General: No tenderness.     Cervical back: Normal range of motion and neck supple.  Skin:    General: Skin is warm and dry.     Findings: No erythema or rash.  Neurological:     Mental Status: She is alert and oriented to person, place, and time.     ED Results and Treatments Labs (all labs ordered are listed, but only abnormal results are displayed) Labs Reviewed - No data to display                                                                                                                       EKG  EKG Interpretation  Date/Time:    Ventricular Rate:    PR Interval:    QRS Duration:   QT Interval:    QTC Calculation:   R Axis:     Text Interpretation:        Radiology DG Chest 2 View  Result Date: 01/31/2020 CLINICAL DATA:  Cough and congestion for 5 days EXAM: CHEST - 2 VIEW COMPARISON:  None. FINDINGS: The heart size and mediastinal contours are within normal limits. Both lungs are clear. The visualized skeletal structures are unremarkable. IMPRESSION: No active cardiopulmonary disease. Electronically Signed   By: Lucienne Capers  M.D.   On: 01/31/2020 04:30    Pertinent labs & imaging results that were available during my care of the patient were reviewed by me and considered in my medical decision making (see chart for details).  Medications Ordered in ED Medications  dexamethasone (DECADRON) tablet 10 mg (has no administration in time range)  azithromycin (ZITHROMAX) tablet 500 mg (has no administration in time range)  Procedures Procedures  (including critical care time)  Medical Decision Making / ED Course I have reviewed the nursing notes for this encounter and the patient's prior records (if available in EHR or on provided paperwork).   Stephanie Dyer was evaluated in Emergency Department on 01/31/2020 for the symptoms described in the history of present illness. She was evaluated in the context of the global COVID-19 pandemic, which necessitated consideration that the patient might be at risk for infection with the SARS-CoV-2 virus that causes COVID-19. Institutional protocols and algorithms that pertain to the evaluation of patients at risk for COVID-19 are in a state of rapid change based on information released by regulatory bodies including the CDC and federal and state organizations. These policies and algorithms were followed during the patient's care in the ED.  Patient is afebrile. Well-appearing and well-hydrated. Lungs clear to auscultation Chest x-ray negative for pneumonia Consistent with bronchitis and laryngitis. Given a dose of Decadron and first dose of azithromycin here.      Final Clinical Impression(s) / ED Diagnoses Final diagnoses:  Bronchitis  Laryngitis    The patient appears reasonably screened and/or stabilized for discharge and I doubt any other medical condition or other [facility] requiring further screening, evaluation, or treatment in the ED at this  time prior to discharge. Safe for discharge with strict return precautions.  Disposition: Discharge  Condition: Good  I have discussed the results, Dx and Tx plan with the patient/family who expressed understanding and agree(s) with the plan. Discharge instructions discussed at length. The patient/family was given strict return precautions who verbalized understanding of the instructions. No further questions at time of discharge.    ED Discharge Orders         Ordered    azithromycin (ZITHROMAX) 250 MG tablet  Daily       Note to Pharmacy: Patient has received 500mg  in the ED already.   01/31/20 0610            Follow Up: Loman Brooklyn, [address] [place] [address] [phone]  Call  To schedule an appointment for close follow up     This chart was dictated using voice recognition software.  Despite best efforts to proofread,  errors can occur which can change the documentation meaning.   Fatima Blank, MD 01/31/20 [phone]

## 2020-02-11 ENCOUNTER — Ambulatory Visit: Payer: Medicare Other

## 2020-02-11 ENCOUNTER — Ambulatory Visit (INDEPENDENT_AMBULATORY_CARE_PROVIDER_SITE_OTHER): Payer: Medicare Other | Admitting: Nurse Practitioner

## 2020-02-11 ENCOUNTER — Encounter: Payer: Self-pay | Admitting: Nurse Practitioner

## 2020-02-11 DIAGNOSIS — B372 Candidiasis of skin and nail: Secondary | ICD-10-CM

## 2020-02-11 MED ORDER — CLOTRIMAZOLE-BETAMETHASONE 1-0.05 % EX CREA: 1.0000 "application " | TOPICAL_CREAM | Freq: Two times a day (BID) | CUTANEOUS | 2 refills | Status: DC

## 2020-02-11 NOTE — Progress Notes (Signed)
   Virtual Visit via telephone Note Due to COVID-19 pandemic this visit was conducted virtually. This visit type was conducted due to national recommendations for restrictions regarding the COVID-19 Pandemic (e.g. social distancing, sheltering in place) in an effort to limit this patient's exposure and mitigate transmission in our community. All issues noted in this document were discussed and addressed.  A physical exam was not performed with this format.  I connected with Stephanie Dyer on 02/11/20 at 8:35 by telephone and verified that I am speaking with the correct person using two identifiers. Stephanie Dyer is currently located at home and  No one is currently with her during visit. The provider, Mary-Margaret Hassell Done, FNP is located in their office at time of visit.  I discussed the limitations, risks, security and privacy concerns of performing an evaluation and management service by telephone and the availability of in person appointments. I also discussed with the patient that there may be a patient responsible charge related to this service. The patient expressed understanding and agreed to proceed.   History and Present Illness:   Chief Complaint: Vaginitis   HPI Patient was seen by B. Blanch Media 2 months ago with itchy rash in the perineal area and groin area. She was given cortisone cream which helped a little but now is worse. Area is red and very itchy.    Review of Systems  Constitutional: Negative.   HENT: Negative.   Cardiovascular: Negative.   Genitourinary: Negative.   Musculoskeletal: Negative.   Skin: Negative.   Neurological: Negative.   Psychiatric/Behavioral: Negative.   All other systems reviewed and are negative.    Observations/Objective: Alert and oriented- answers all questions appropriately No distress Groin areas bil are red and itchy    Assessment and Plan: Stephanie Dyer in today with chief complaint of Vaginitis   1. Cutaneous candidiasis Apply  cream- dry area with hair dryer May use gold bond ointment Avoid itching Use mild soaps    Follow Up Instructions: prn    I discussed the assessment and treatment plan with the patient. The patient was provided an opportunity to ask questions and all were answered. The patient agreed with the plan and demonstrated an understanding of the instructions.   The patient was advised to call back or seek an in-person evaluation if the symptoms worsen or if the condition fails to improve as anticipated.  The above assessment and management plan was discussed with the patient. The patient verbalized understanding of and has agreed to the management plan. Patient is aware to call the clinic if symptoms persist or worsen. Patient is aware when to return to the clinic for a follow-up visit. Patient educated on when it is appropriate to go to the emergency department.   Time call ended:  8:48  I provided 13 minutes of non-face-to-face time during this encounter.    Mary-Margaret Hassell Done, FNP

## 2020-02-13 ENCOUNTER — Ambulatory Visit: Payer: Medicare Other | Attending: Family Medicine | Admitting: Physical Therapy

## 2020-02-13 ENCOUNTER — Other Ambulatory Visit: Payer: Self-pay

## 2020-02-13 ENCOUNTER — Encounter: Payer: Self-pay | Admitting: Physical Therapy

## 2020-02-13 DIAGNOSIS — N941 Unspecified dyspareunia: Secondary | ICD-10-CM | POA: Insufficient documentation

## 2020-02-13 DIAGNOSIS — R252 Cramp and spasm: Secondary | ICD-10-CM | POA: Insufficient documentation

## 2020-02-13 DIAGNOSIS — R293 Abnormal posture: Secondary | ICD-10-CM | POA: Diagnosis not present

## 2020-02-13 NOTE — Therapy (Addendum)
[facility] [address], [place], [place], [address] Phone: [phone]   Fax:  [phone]  Physical Therapy Evaluation  Patient Details  Name: Stephanie Dyer MRN: [id number] Date of Birth: Jul 06, 1975 Referring Provider (PT): Loman Brooklyn, FNP   Encounter Date: 02/13/2020   PT End of Session - 02/13/20 1719    Visit Number 1    Date for PT Re-Evaluation 05/07/20    PT Start Time 6962    PT Stop Time 1708    PT Time Calculation (min) 47 min    Activity Tolerance Patient tolerated treatment well;Other (comment)   deferred pelvic assessment today personal reasons   Behavior During Therapy WFL for tasks assessed/performed           Past Medical History:  Diagnosis Date  . Angio-edema   . Ankylosing spondylitis ([place])   . Anxiety   . Arthritis   . Asthma   . Cervical cancer ([place]) 1998  . Elevated LFTs   . Environmental allergies   . GERD (gastroesophageal reflux disease)   . Hypoglycemia   . Kidney stones   . Lumbar back pain   . Migraines   . PCOS (polycystic ovarian syndrome)   . Thyroid cyst   . Urticaria   . Vitamin D deficiency     Past Surgical History:  Procedure Laterality Date  . ABDOMINAL HYSTERECTOMY  10/2018  . ADENOIDECTOMY    . CESAREAN SECTION  2016  . CHOLECYSTECTOMY  1998  . COSMETIC SURGERY  2015  . GASTRIC BYPASS  2014  . GASTRIC BYPASS    . KNEE SURGERY Left 2001  . LITHOTRIPSY  2021  . TONSILLECTOMY      There were no vitals filed for this visit.    Subjective Assessment - 02/13/20 1628    Subjective Pt is having pain with deep penetration hitting something (cervix).  Pt is also getting HA even if pain is not occurring during intercourse. Taking black cohash helped with menopaus symptoms.  Pt reports chronic low back pain aggravated at work, stepping wrong, sleeping wrong, and it is constant pain and aggravated with minor things    Patient Stated Goals stop having HA  and reduce pain with intercourse    Currently in Pain? Yes    Pain Score 9     Pain Location Head    Pain Orientation Right;Left;Anterior    Pain Descriptors / Indicators Sharp;Stabbing    Pain Type Chronic pain    Pain Onset More than a month ago   headache   Pain Frequency Intermittent    Aggravating Factors  intercourse    Pain Relieving Factors nothing    Effect of Pain on Daily Activities HA that lasts the entire day and sometimes still there the next day    Multiple Pain Sites No              [facility] PT Assessment - 02/24/20 0001      Assessment   Medical Diagnosis N94.10 (ICD-10-CM) - Pain in female genitalia on intercourse    Referring Provider (PT) Loman Brooklyn, FNP    Onset Date/Surgical Date --   August 2020   Prior Therapy No      Precautions   Precautions None      Balance Screen   Has the patient fallen in the past 6 months No      [place] residence    Living Arrangements Parent;Other relatives;Children  parents, brother, son     Prior Function   Level of Independence Independent    Vocation Full time employment    Chartered certified accountant tree; active job      Cognition   Overall Cognitive Status Within Functional Limits for tasks assessed      Posture/Postural Control   Posture/Postural Control Postural limitations    Postural Limitations Increased lumbar lordosis;Rounded Shoulders;Increased thoracic kyphosis      ROM / Strength   AROM / PROM / Strength PROM;AROM      AROM   Overall AROM Comments lumbar flexion 50%      PROM   Overall PROM Comments hip bilateral 30% throughout      Flexibility   Soft Tissue Assessment /Muscle Length yes    Hamstrings 70%      Palpation   Palpation comment hamstrings, glutes, lumbar tight; lumbar paraspinals TTP                      Objective measurements completed on examination: See above findings.     Pelvic Floor Special  Questions - 02/24/20 0001    Prior Pelvic/Prostate Exam Yes    Are you Pregnant or attempting pregnancy? No    Prior Pregnancies Yes    Number of Pregnancies 2   more pregnancies that were not live births   Number of C-Sections 1    Number of Vaginal Deliveries 1    Any difficulty with labor and deliveries Yes    Currently Sexually Active Yes    Is this Painful Yes    Marinoff Scale pain interrupts completion    Urinary Leakage Yes    How often occasionally    Activities that cause leaking Coughing;Sneezing;Laughing    Urinary urgency No    Urinary frequency normal    Falling out feeling (prolapse) No    Exam Type Deferred   not comfortable doing today                     PT Short Term Goals - 02/24/20 1931      PT SHORT TERM GOAL #1   Title ind with intial self stretch to pelvic floor    Time 4    Period Weeks    Status New             PT Long Term Goals - 02/24/20 1915      PT LONG TERM GOAL #1   Title Pt will be ind with techniques for stretching and breathing to reduce pain    Time 12    Period Weeks    Status New    Target Date 05/07/20      PT LONG TERM GOAL #2   Title pt will report she is able to have intercourse with 80% less pain    Time 12    Period Weeks    Status New      PT LONG TERM GOAL #3   Title pt will demonstrate ability to create TrA tension and maintain during functional movements such as squatting, lifting, and supine to sit.    Baseline uses valsalva no TrA    Time 12    Period Weeks    Status New      PT LONG TERM GOAL #4   Title Pt will report she is no longer experiencing HA after intercourse    Time 12    Period Weeks    Status New  PT LONG TERM GOAL #5   Title Pt will report she is able to adjust with oh nut or positions in order to avoid highly tender spot in vaginal canal    Time 12    Period Weeks    Status New    Target Date 05/07/20                  Plan - 02/24/20 1935    Clinical  Impression Statement Pt presents to skilled PT due to dyspareunia.  Pt has been having most of the pain and HA after intercourse but also some acute pain at the end of the vaginal canal.  Pt has hx significant for multiple surgeries as mentioned below.  Pt has many fascial restrictions, decreased ribcage movement, and uses valsalva maneuver for funcitonal movements due to core weakness.  Pt has excess lumbar lordosis, hamstring and hip flexor tension. Pt has fascial restriction along linea alba where hernia was repaired.  Upper traps and diaphragm are overactive and tight with excess ribcage excursion.    Personal Factors and Comorbidities Comorbidity 3+    Comorbidities hysterectomy; early menopaus; abdominal surgeries including hernia repair, gastric bypass, c-secion; low back pain, cervical cancer, PCOS, GERD, kidney stones, tummy tuck    Examination-Participation Restrictions Interpersonal Relationship;Community Activity    Stability/Clinical Decision Making Unstable/Unpredictable    Clinical Decision Making High    Rehab Potential Good    PT Frequency 1x / week    PT Duration 12 weeks    PT Treatment/Interventions ADLs/Self Care Home Management;Biofeedback;Cryotherapy;Electrical Stimulation;Moist Heat;Gait training;Stair training;Functional mobility training;Therapeutic activities;Therapeutic exercise;Neuromuscular re-education;Patient/family education;Manual techniques;Taping;Dry needling;Passive range of motion;Scar mobilization    PT Next Visit Plan abdominal fascial release; breathing; internal assessment    Consulted and Agree with Plan of Care Patient           Patient will benefit from skilled therapeutic intervention in order to improve the following deficits and impairments:  Pain,Postural dysfunction,Impaired flexibility,Increased fascial restricitons,Decreased strength,Decreased coordination,Decreased range of motion,Obesity,Improper body mechanics,Decreased scar mobility  Visit  Diagnosis: Abnormal posture  Female dyspareunia  Cramp and spasm     Problem List Patient Active Problem List   Diagnosis Date Noted  . Other allergic rhinitis 01/22/2020  . Adverse food reaction 01/22/2020  . History of asthma 01/22/2020  . Multiple drug allergies 01/22/2020  . History of systemic reaction to hymenoptera sting 01/22/2020  . Marijuana use 12/09/2019  . Generalized anxiety disorder 12/06/2019  . Hot flashes 12/06/2019  . Pain in female genitalia on intercourse 12/06/2019  . Ankylosing spondylitis ([place])   . Thyroid cyst   . Asthma   . Environmental allergies   . Migraines   . Vitamin D deficiency   . Elevated LFTs   . Lumbar back pain   . Anxiety   . PCOS (polycystic ovarian syndrome)   . GERD (gastroesophageal reflux disease)     Jule Ser, PT 02/24/2020, 7:52 PM  [facility] [address] [place], [place], [address] Phone: [phone]   Fax:  [phone]  Name: Stephanie Dyer MRN: [id number] Date of Birth: 25-Apr-1975

## 2020-02-14 ENCOUNTER — Telehealth: Payer: Self-pay | Admitting: Internal Medicine

## 2020-02-14 NOTE — Telephone Encounter (Signed)
PLEASE CALL PATIENT, SHE WANTS TO CANCEL HER PROCEDURE

## 2020-02-14 NOTE — Telephone Encounter (Signed)
Tried to call pt several times. Phone rings once then is silent for several seconds followed by a busy signal.  Informed endo scheduler to cancel procedure per pt request.

## 2020-02-15 ENCOUNTER — Telehealth: Payer: Self-pay | Admitting: Unknown Physician Specialty

## 2020-02-15 NOTE — Telephone Encounter (Signed)
Talked to husband who states patient has gone to get infusion

## 2020-02-18 ENCOUNTER — Other Ambulatory Visit: Payer: Self-pay

## 2020-02-18 DIAGNOSIS — J4520 Mild intermittent asthma, uncomplicated: Secondary | ICD-10-CM

## 2020-02-18 NOTE — Telephone Encounter (Signed)
  Prescription Request  02/18/2020  What is the name of the medication or equipment? albuterol (VENTOLIN HFA) 108 (90 Base) MCG/ACT inhaler  Have you contacted your pharmacy to request a refill? (if applicable) no new rx she is a new pt and now needs the rx. She tested positive for COVID last week  Which pharmacy would you like this sent to? [facility]   Patient notified that their request is being sent to the clinical staff for review and that they should receive a response within 2 business days.

## 2020-02-18 NOTE — Telephone Encounter (Signed)
Medication pended- has never been filled by [place] Covering PCP

## 2020-02-19 MED ORDER — ALBUTEROL SULFATE HFA 108 (90 BASE) MCG/ACT IN AERS: 2.0000 | INHALATION_SPRAY | Freq: Four times a day (QID) | RESPIRATORY_TRACT | 4 refills | Status: DC | PRN

## 2020-02-21 ENCOUNTER — Ambulatory Visit: Payer: Medicare Other | Admitting: Physical Therapy

## 2020-02-24 NOTE — Addendum Note (Signed)
Addended by: Su Hoff on: 02/24/2020 07:55 PM   Modules accepted: Orders

## 2020-02-25 ENCOUNTER — Other Ambulatory Visit (HOSPITAL_COMMUNITY): Payer: Medicare Other

## 2020-02-25 ENCOUNTER — Encounter (HOSPITAL_COMMUNITY): Payer: Medicare Other

## 2020-02-26 ENCOUNTER — Ambulatory Visit (HOSPITAL_COMMUNITY): Admit: 2020-02-26 | Payer: Medicare Other

## 2020-02-26 ENCOUNTER — Encounter (HOSPITAL_COMMUNITY): Payer: Self-pay

## 2020-02-26 ENCOUNTER — Ambulatory Visit: Payer: Medicare Other | Admitting: Physical Therapy

## 2020-02-26 SURGERY — COLONOSCOPY WITH PROPOFOL
Anesthesia: Monitor Anesthesia Care

## 2020-03-10 ENCOUNTER — Encounter: Payer: Self-pay | Admitting: Nurse Practitioner

## 2020-03-10 ENCOUNTER — Other Ambulatory Visit: Payer: Self-pay

## 2020-03-10 ENCOUNTER — Ambulatory Visit (INDEPENDENT_AMBULATORY_CARE_PROVIDER_SITE_OTHER): Payer: Medicare Other | Admitting: Nurse Practitioner

## 2020-03-10 VITALS — BP 123/74 | HR 97 | Temp 97.4°F | Ht 63.0 in | Wt 221.0 lb

## 2020-03-10 DIAGNOSIS — L600 Ingrowing nail: Secondary | ICD-10-CM | POA: Diagnosis not present

## 2020-03-10 NOTE — Patient Instructions (Signed)
Ingrown Toenail An ingrown toenail occurs when the corner or sides of a toenail grow into the surrounding skin. This causes discomfort and pain. The big toe is most commonly affected, but any of the toes can be affected. If an ingrown toenail is not treated, it can become infected. What are the causes? This condition may be caused by:  Wearing shoes that are too small or tight.  An injury, such as stubbing your toe or having your toe stepped on.  Improper cutting or care of your toenails.  Having nail or foot abnormalities that were present from birth (congenital abnormalities), such as having a nail that is too big for your toe. What increases the risk? The following factors may make you more likely to develop ingrown toenails:  Age. Nails tend to get thicker with age, so ingrown nails are more common among older people.  Cutting your toenails incorrectly, such as cutting them very short or cutting them unevenly. An ingrown toenail is more likely to get infected if you have:  Diabetes.  Blood flow (circulation) problems. What are the signs or symptoms? Symptoms of an ingrown toenail may include:  Pain, soreness, or tenderness.  Redness.  Swelling.  Hardening of the skin that surrounds the toenail. Signs that an ingrown toenail may be infected include:  Fluid or pus.  Symptoms that get worse instead of better. How is this diagnosed? An ingrown toenail may be diagnosed based on your medical history, your symptoms, and a physical exam. If you have fluid or blood coming from your toenail, a sample may be collected to test for the specific type of bacteria that is causing the infection. How is this treated? Treatment depends on how severe your ingrown toenail is. You may be able to care for your toenail at home.  If you have an infection, you may be prescribed antibiotic medicines.  If you have fluid or pus draining from your toenail, your health care provider may drain  it.  If you have trouble walking, you may be given crutches to use.  If you have a severe or infected ingrown toenail, you may need a procedure to remove part or all of the nail. Follow these instructions at home: Foot care   Do not pick at your toenail or try to remove it yourself.  Soak your foot in warm, soapy water. Do this for 20 minutes, 3 times a day, or as often as told by your health care provider. This helps to keep your toe clean and keep your skin soft.  Wear shoes that fit well and are not too tight. Your health care provider may recommend that you wear open-toed shoes while you heal.  Trim your toenails regularly and carefully. Cut your toenails straight across to prevent injury to the skin at the corners of the toenail. Do not cut your nails in a curved shape.  Keep your feet clean and dry to help prevent infection. Medicines  Take over-the-counter and prescription medicines only as told by your health care provider.  If you were prescribed an antibiotic, take it as told by your health care provider. Do not stop taking the antibiotic even if you start to feel better. Activity  Return to your normal activities as told by your health care provider. Ask your health care provider what activities are safe for you.  Avoid activities that cause pain. General instructions  If your health care provider told you to use crutches to help you move around, use them   as instructed.  Keep all follow-up visits as told by your health care provider. This is important. Contact a health care provider if:  You have more redness, swelling, pain, or other symptoms that do not improve with treatment.  You have fluid, blood, or pus coming from your toenail. Get help right away if:  You have a red streak on your skin that starts at your foot and spreads up your leg.  You have a fever. Summary  An ingrown toenail occurs when the corner or sides of a toenail grow into the surrounding  skin. This causes discomfort and pain. The big toe is most commonly affected, but any of the toes can be affected.  If an ingrown toenail is not treated, it can become infected.  Fluid or pus draining from your toenail is a sign of infection. Your health care provider may need to drain it. You may be given antibiotics to treat the infection.  Trimming your toenails regularly and properly can help you prevent an ingrown toenail. This information is not intended to replace advice given to you by your health care provider. Make sure you discuss any questions you have with your health care provider. Document Revised: 06/16/2018 Document Reviewed: 11/10/2016 Elsevier Patient Education  2020 Elsevier Inc.  

## 2020-03-10 NOTE — Progress Notes (Signed)
   Subjective:    Patient ID: Stephanie Dyer, female    DOB: 10/12/1975, 45 y.o.   MRN: [id number]   Chief Complaint: Ingrown Toenail   HPI Patient comes in today with a right ingrown toenail. Has become very painful. Hurts to walk on. Denies any drainage unless she picks at it.    Review of Systems  Skin: Negative.   All other systems reviewed and are negative.      Objective:   Physical Exam Vitals and nursing note reviewed.  Constitutional:      Appearance: Normal appearance. She is obese.  Cardiovascular:     Rate and Rhythm: Regular rhythm.     Heart sounds: Normal heart sounds.  Pulmonary:     Breath sounds: Normal breath sounds.  Skin:    Comments: Lateral side of right great toe nail ingronw. No erythema or drainage.  Neurological:     General: No focal deficit present.     Mental Status: She is alert and oriented to person, place, and time.  Psychiatric:        Mood and Affect: Mood normal.        Behavior: Behavior normal.    BP 123/74   Pulse 97   Temp (!) 97.4 F (36.3 C) (Temporal)   Ht 5\' 3"  (1.6 m)   Wt 221 lb (100.2 kg)   LMP 11/04/2018   SpO2 95%   BMI 39.15 kg/m    PROCEDURE  Consent:    Consent obtained:  oral   Consent given by:  patient   Risks discussed:  possible infection   Alternatives discussed:  yes Wound    Type:  clean   Size:  2cm   Location: lateral side right great toe nail Pre-procedure details:    Skin preparation:  betadine Anesthesia     Anesthesia method:  local   Local anesthetic: lidocaine 2% plain Procedure type:    Complexity:  simple Procedure details:    Incision types:  none   Scalpel blade:  none   Wound management:  excised medial side of righ great toe   Drainage appearance:  none   Drainage amount:  none   Wound treatment:  none   Packing materials: none Post-procedure details:    Dressing: cone dressing   Patient tolerance of procedure:  well       Assessment & Plan:  Stephanie Dyer in  today with chief complaint of Ingrown Toenail   1. Ingrown toenail of right foot Keep clean Soak in epsom salt daily RTO prn    The above assessment and management plan was discussed with the patient. The patient verbalized understanding of and has agreed to the management plan. Patient is aware to call the clinic if symptoms persist or worsen. Patient is aware when to return to the clinic for a follow-up visit. Patient educated on when it is appropriate to go to the emergency department.   Mary-Margaret Morene Rankins, FNP

## 2020-03-13 ENCOUNTER — Telehealth: Payer: Self-pay | Admitting: Physical Therapy

## 2020-03-13 ENCOUNTER — Ambulatory Visit: Payer: Medicare Other | Attending: Family Medicine | Admitting: Physical Therapy

## 2020-03-13 DIAGNOSIS — R293 Abnormal posture: Secondary | ICD-10-CM | POA: Insufficient documentation

## 2020-03-13 DIAGNOSIS — R252 Cramp and spasm: Secondary | ICD-10-CM | POA: Insufficient documentation

## 2020-03-13 DIAGNOSIS — N941 Unspecified dyspareunia: Secondary | ICD-10-CM | POA: Insufficient documentation

## 2020-03-13 NOTE — Telephone Encounter (Signed)
Pt called due to no show.  Her father passed away this week due to covid and she lost track of appointments.  Pt wants to continue plan of care with PT for next follow up on 03/20/20  [facility], PT 03/13/20 4:39 PM

## 2020-03-20 ENCOUNTER — Telehealth: Payer: Self-pay | Admitting: Physical Therapy

## 2020-03-20 ENCOUNTER — Ambulatory Visit: Payer: Medicare Other | Admitting: Physical Therapy

## 2020-03-20 NOTE — Telephone Encounter (Signed)
Pt was called due to no show.  Pt states she was stuck at a house signing. She was reminded again of no show policy but was rescheduled this time  Gustavus Bryant, PT 03/20/20 11:33 AM

## 2020-03-21 ENCOUNTER — Encounter: Payer: Self-pay | Admitting: Physical Therapy

## 2020-03-24 ENCOUNTER — Encounter: Payer: Self-pay | Admitting: Family Medicine

## 2020-03-25 ENCOUNTER — Encounter: Payer: Self-pay | Admitting: Family Medicine

## 2020-03-25 DIAGNOSIS — B001 Herpesviral vesicular dermatitis: Secondary | ICD-10-CM

## 2020-03-25 MED ORDER — VALACYCLOVIR HCL 1 G PO TABS: 2000.0000 mg | ORAL_TABLET | Freq: Two times a day (BID) | ORAL | 0 refills | Status: DC

## 2020-03-27 ENCOUNTER — Ambulatory Visit: Payer: Medicare Other | Admitting: Physical Therapy

## 2020-03-27 ENCOUNTER — Other Ambulatory Visit: Payer: Self-pay

## 2020-03-27 DIAGNOSIS — N941 Unspecified dyspareunia: Secondary | ICD-10-CM | POA: Diagnosis present

## 2020-03-27 DIAGNOSIS — R293 Abnormal posture: Secondary | ICD-10-CM | POA: Diagnosis not present

## 2020-03-27 DIAGNOSIS — R252 Cramp and spasm: Secondary | ICD-10-CM

## 2020-03-27 NOTE — Therapy (Addendum)
[facility] [address] [place], [place], [address] Phone: [phone]   Fax:  [phone]  Physical Therapy Treatment  Patient Details  Name: Stephanie Dyer MRN: [id number] Date of Birth: 09-24-1975 Referring Provider (PT): Loman Brooklyn, FNP   Encounter Date: 03/27/2020   PT End of Session - 03/27/20 1211    Visit Number 2    Date for PT Re-Evaluation 05/07/20    Authorization Type medicare A/B    PT Start Time 1101    PT Stop Time 1143    PT Time Calculation (min) 42 min    Activity Tolerance Patient tolerated treatment well    Behavior During Therapy [facility] for tasks assessed/performed           Past Medical History:  Diagnosis Date  . Angio-edema   . Ankylosing spondylitis ([place])   . Anxiety   . Arthritis   . Asthma   . Cervical cancer ([place]) 1998  . Elevated LFTs   . Environmental allergies   . GERD (gastroesophageal reflux disease)   . History of gastric bypass   . History of iron deficiency anemia   . Hypoglycemia   . Kidney stones   . Lumbar back pain   . Migraines   . PCOS (polycystic ovarian syndrome)   . Pseudotumor cerebri 2006  . Thyroid cyst   . Urticaria   . Vitamin D deficiency     Past Surgical History:  Procedure Laterality Date  . ABDOMINAL HYSTERECTOMY  10/2018  . ADENOIDECTOMY    . CESAREAN SECTION  2016  . CHOLECYSTECTOMY  1998  . COSMETIC SURGERY  2015  . GASTRIC BYPASS  2014  . KNEE SURGERY Left 2001  . LITHOTRIPSY  2021  . TONSILLECTOMY      There were no vitals filed for this visit.                   Pelvic Floor Special Questions - 03/30/20 0001    Pelvic Floor Internal Exam pt identity confimred and internal soft tissue assess and treat was performed today    Exam Type Vaginal    Palpation TTP levators and vaginal canal fascia    Strength fair squeeze, definite lift    Strength # of reps 3    Strength # of seconds 3             [facility]  Adult PT Treatment/Exercise - 03/30/20 0001      Self-Care   Self-Care Other Self-Care Comments    Other Self-Care Comments  self massage and moisturizers      Neuro Re-ed    Neuro Re-ed Details  TrA activation in supine      Manual Therapy   Manual Therapy Internal Pelvic Floor;Soft tissue mobilization    Manual therapy comments pt identity confirmed and informed consent    Soft tissue mobilization rectus abdominus    Internal Pelvic Floor fascial release and levators                  PT Education - 03/27/20 1141    Education Details Access Code: [place] and stretch with moisturizing    Person(s) Educated Patient    Methods Explanation;Demonstration;Handout;Verbal cues;Tactile cues    Comprehension Verbalized understanding;Returned demonstration            PT Short Term Goals - 02/24/20 1931      PT SHORT TERM GOAL #1   Title ind with intial self stretch to pelvic  floor    Time 4    Period Weeks    Status New             PT Long Term Goals - 02/24/20 1915      PT LONG TERM GOAL #1   Title Pt will be ind with techniques for stretching and breathing to reduce pain    Time 12    Period Weeks    Status New    Target Date 05/07/20      PT LONG TERM GOAL #2   Title pt will report she is able to have intercourse with 80% less pain    Time 12    Period Weeks    Status New      PT LONG TERM GOAL #3   Title pt will demonstrate ability to create TrA tension and maintain during functional movements such as squatting, lifting, and supine to sit.    Baseline uses valsalva no TrA    Time 12    Period Weeks    Status New      PT LONG TERM GOAL #4   Title Pt will report she is no longer experiencing HA after intercourse    Time 12    Period Weeks    Status New      PT LONG TERM GOAL #5   Title Pt will report she is able to adjust with oh nut or positions in order to avoid highly tender spot in vaginal canal    Time 12    Period Weeks    Status New     Target Date 05/07/20                 Plan - 03/30/20 1930    Clinical Impression Statement Pt at first visit since eval.  No goals met.  Pt was assessed vaginally for strength and soft tissue impairments.  Pt has weakness and tension as noted.  Pt educated on self massage to stretch for improved soft tissue pliability. Pt also has tension in rectus abdminus.  Pt was educated on activated deeper core muscles.  Continue with POC for reduced pain and work toward functional goals.    Comorbidities hysterectomy; early menopaus; abdominal surgeries including hernia repair, gastric bypass, c-secion; low back pain, cervical cancer, PCOS, GERD, kidney stones, tummy tuck    PT Treatment/Interventions ADLs/Self Care Home Management;Biofeedback;Cryotherapy;Electrical Stimulation;Moist Heat;Gait training;Stair training;Functional mobility training;Therapeutic activities;Therapeutic exercise;Neuromuscular re-education;Patient/family education;Manual techniques;Taping;Dry needling;Passive range of motion;Scar mobilization    PT Next Visit Plan core strength breathing; internal STM    PT Home Exercise Plan moisturizer and self massage    Consulted and Agree with Plan of Care Patient           Patient will benefit from skilled therapeutic intervention in order to improve the following deficits and impairments:  Pain,Postural dysfunction,Impaired flexibility,Increased fascial restricitons,Decreased strength,Decreased coordination,Decreased range of motion,Obesity,Improper body mechanics,Decreased scar mobility  Visit Diagnosis: Abnormal posture  Female dyspareunia  Cramp and spasm     Problem List Patient Active Problem List   Diagnosis Date Noted  . Other allergic rhinitis 01/22/2020  . Adverse food reaction 01/22/2020  . History of asthma 01/22/2020  . Multiple drug allergies 01/22/2020  . History of systemic reaction to hymenoptera sting 01/22/2020  . Marijuana use 12/09/2019  .  Generalized anxiety disorder 12/06/2019  . Hot flashes 12/06/2019  . Pain in female genitalia on intercourse 12/06/2019  . Ankylosing spondylitis ([place])   . Thyroid cyst   . Asthma   .  Environmental allergies   . Migraines   . Vitamin D deficiency   . Elevated LFTs   . Lumbar back pain   . Anxiety   . PCOS (polycystic ovarian syndrome)   . GERD (gastroesophageal reflux disease)     Jule Ser, PT 03/30/2020, 7:45 PM  [facility] [address] [place], [place], [address] Phone: [phone]   Fax:  [phone]  Name: Stephanie Dyer MRN: [id number] Date of Birth: 1975/12/30

## 2020-03-27 NOTE — Patient Instructions (Addendum)
STRETCHING THE PELVIC FLOOR MUSCLES NO DILATOR  Supplies . Vaginal lubricant . Mirror (optional) . Gloves (optional) or clean hands Positioning . Start in a semi-reclined position with your head propped up. Bend your knees and place your thumb or finger at the vaginal opening. Procedure . Apply a moderate amount of lubricant on the outer skin of your vagina, the labia minora.  Apply additional lubricant to your finger. Marland Kitchen Spread the skin away from the vaginal opening. Place the end of your finger at the opening. . Do a maximum contraction of the pelvic floor muscles. Tighten the vagina and the anus maximally and relax. . When you know they are relaxed, gently and slowly insert your finger into your vagina, directing your finger slightly downward, for 2-3 inches of insertion. . Relax and stretch the 6 o'clock position . Hold each stretch for _30-60 seconds, no pain more than 3/10 . Repeat the stretching in the 4 o'clock and 8 o'clock positions. . Next gently move your finger in a "U" shape  several times.  . You can also enter a second finger to work to spread the vaginal opening wider from 3:00-6:00 and 6:00-9:00 or 3:00-9:00 . Perform daily or every other day . Once you have accomplished the techniques you may try them in standing with one foot resting on the tub, or in other positions.  This is a good stretch to do in the shower if you don't need to use lubricant.  This can be done at 35 weeks or later in your pregnancy.   Moisturizers . They are used in the vagina to hydrate the mucous membrane that make up the vaginal canal. . Designed to keep a more normal acid balance (ph) . Once placed in the vagina, it will last between two to three days.  . Use 2-3 times per week at bedtime  . Ingredients to avoid is glycerin and fragrance, can increase chance of infection . Should not be used just before sex due to causing irritation . Most are gels administered either in a tampon-shaped  applicator or as a vaginal suppository. They are non-hormonal.   Types of Moisturizers(internal use)  . Vitamin E vaginal suppositories- Whole foods, Amazon . Moist Again . Coconut oil- can break down condoms . Julva- (Do no use if on Tamoxifen) amazon . Yes moisturizer- amazon . NeuEve Silk , NeuEve Silver for menopausal or over 65 (if have severe vaginal atrophy or cancer treatments use NeuEve Silk for  1 month than move to [employer])- [employer], MapleFlower.dk . Olive and Bee intimate cream- www.oliveandbee.com.au . Mae vaginal Butler . Aloe .    Creams to use externally on the Vulva area  Albertson's (good for for cancer patients that had radiation to the area)- [place] or [employer].FlyingBasics.com.br  V-magic cream - amazon  Julva-amazon  Vital "V [place] salve ( help moisturize and help with thinning vulvar area, does have [place] by Irwin Brakeman labial moisturizer ([place],   Coconut or olive oil  aloe   Things to avoid in the vaginal area . Do not use things to irritate the vulvar area . No lotions just specialized creams for the vulva area- Neogyn, V-magic, No soaps; can use Aveeno or Calendula cleanser if needed. Must be gentle . No deodorants . No douches . Good to sleep without underwear to let the vaginal area to air out . No scrubbing: spread the lips  to let warm water rinse over labias and pat dry  [facility] [address] [place], [place] [address] Phone # [phone] Fax [phone] Access Code: [facility] URL: [email or web address] Date: 03/27/2020 Prepared by: Jari Favre  Exercises Hooklying Small March - 1 x daily - 7 x weekly - 2 sets - 10 reps

## 2020-03-28 ENCOUNTER — Encounter: Payer: Self-pay | Admitting: Family Medicine

## 2020-03-28 ENCOUNTER — Other Ambulatory Visit: Payer: Self-pay

## 2020-03-28 ENCOUNTER — Ambulatory Visit (INDEPENDENT_AMBULATORY_CARE_PROVIDER_SITE_OTHER): Payer: Medicare Other | Admitting: Family Medicine

## 2020-03-28 VITALS — BP 117/75 | HR 78 | Temp 98.1°F | Ht 63.0 in | Wt 214.4 lb

## 2020-03-28 DIAGNOSIS — Z114 Encounter for screening for human immunodeficiency virus [HIV]: Secondary | ICD-10-CM

## 2020-03-28 DIAGNOSIS — F4321 Adjustment disorder with depressed mood: Secondary | ICD-10-CM | POA: Diagnosis not present

## 2020-03-28 DIAGNOSIS — R238 Other skin changes: Secondary | ICD-10-CM

## 2020-03-28 DIAGNOSIS — Z1159 Encounter for screening for other viral diseases: Secondary | ICD-10-CM

## 2020-03-28 DIAGNOSIS — L0291 Cutaneous abscess, unspecified: Secondary | ICD-10-CM

## 2020-03-28 DIAGNOSIS — R233 Spontaneous ecchymoses: Secondary | ICD-10-CM

## 2020-03-28 DIAGNOSIS — Z720 Tobacco use: Secondary | ICD-10-CM | POA: Diagnosis not present

## 2020-03-28 MED ORDER — FLUCONAZOLE 150 MG PO TABS: 150.0000 mg | ORAL_TABLET | Freq: Once | ORAL | 0 refills | Status: AC

## 2020-03-28 MED ORDER — BUPROPION HCL ER (SR) 150 MG PO TB12: 150.0000 mg | ORAL_TABLET | Freq: Two times a day (BID) | ORAL | 2 refills | Status: DC

## 2020-03-28 MED ORDER — SULFAMETHOXAZOLE-TRIMETHOPRIM 800-160 MG PO TABS: 1.0000 | ORAL_TABLET | Freq: Two times a day (BID) | ORAL | 0 refills | Status: AC

## 2020-03-28 NOTE — Patient Instructions (Addendum)
Start Wellbutrin just once daily for the first 3 days, then increase to twice daily.   Managing Loss, Adult People experience loss in many different ways throughout their lives. Events such as moving, changing jobs, and losing friends can create a sense of loss. The loss may be as serious as a major health change, divorce, death of a pet, or death of a loved one. All of these types of loss are likely to create a physical and emotional reaction known as grief. Grief is the result of a major change or an absence of something or someone that you count on. Grief is a normal reaction to loss. A variety of factors can affect your grieving experience, including:  The nature of your loss.  Your relationship to what or whom you lost.  Your understanding of grief and how to manage it.  Your support system. How to manage lifestyle changes Keep to your normal routine as much as possible.  If you have trouble focusing or doing normal activities, it is acceptable to take some time away from your normal routine.  Spend time with friends and loved ones.  Eat a healthy diet, get plenty of sleep, and rest when you feel tired.   How to recognize changes  The way that you deal with your grief will affect your ability to function as you normally do. When grieving, you may experience these changes:  Numbness, shock, sadness, anxiety, anger, denial, and guilt.  Thoughts about death.  Unexpected crying.  A physical sensation of emptiness in your stomach.  Problems sleeping and eating.  Tiredness (fatigue).  Loss of interest in normal activities.  Dreaming about or imagining seeing the person who died.  A need to remember what or whom you lost.  Difficulty thinking about anything other than your loss for a period of time.  Relief. If you have been expecting the loss for a while, you may feel a sense of relief when it happens. Follow these instructions at home: Activity Express your feelings in  healthy ways, such as:  Talking with others about your loss. It may be helpful to find others who have had a similar loss, such as a support group.  Writing down your feelings in a journal.  Doing physical activities to release stress and emotional energy.  Doing creative activities like painting, sculpting, or playing or listening to music.  Practicing resilience. This is the ability to recover and adjust after facing challenges. Reading some resources that encourage resilience may help you to learn ways to practice those behaviors.   General instructions  Be patient with yourself and others. Allow the grieving process to happen, and remember that grieving takes time. ? It is likely that you may never feel completely done with some grief. You may find a way to move on while still cherishing memories and feelings about your loss. ? Accepting your loss is a process. It can take months or longer to adjust.  Keep all follow-up visits as told by your health care provider. This is important. Where to find support To get support for managing loss:  Ask your health care provider for help and recommendations, such as grief counseling or therapy.  Think about joining a support group for people who are managing a loss. Where to find more information You can find more information about managing loss from:  American Society of Clinical Oncology: www.cancer.net  American Psychological Association: TVStereos.ch Contact a health care provider if:  Your grief is extreme and  keeps getting worse.  You have ongoing grief that does not improve.  Your body shows symptoms of grief, such as illness.  You feel depressed, anxious, or lonely. Get help right away if:  You have thoughts about hurting yourself or others. If you ever feel like you may hurt yourself or others, or have thoughts about taking your own life, get help right away. You can go to your nearest emergency department or call:  Your  local emergency services (911 in the [place]  A suicide crisis helpline, such as the [place] [facility] at 774-034-5074. This is open 24 hours a day. Summary  Grief is the result of a major change or an absence of someone or something that you count on. Grief is a normal reaction to loss.  The depth of grief and the period of recovery depend on the type of loss and your ability to adjust to the change and process your feelings.  Processing grief requires patience and a willingness to accept your feelings and talk about your loss with people who are supportive.  It is important to find resources that work for you and to realize that people experience grief differently. There is not one grieving process that works for everyone in the same way.  Be aware that when grief becomes extreme, it can lead to more severe issues like isolation, depression, anxiety, or suicidal thoughts. Talk with your health care provider if you have any of these issues. This information is not intended to replace advice given to you by your health care provider. Make sure you discuss any questions you have with your health care provider. Document Revised: 08/16/2019 Document Reviewed: 08/16/2019 Elsevier Patient Education  [place].

## 2020-03-28 NOTE — Progress Notes (Signed)
Assessment & Plan:  1. Grief - Uncontrolled.  Patient to continue Prozac 20 mg once daily.  I started her on Wellbutrin today to help with depression and smoking cessation. - buPROPion (WELLBUTRIN SR) 150 MG 12 hr tablet; Take 1 tablet (150 mg total) by mouth 2 (two) times daily.  Dispense: 60 tablet; Refill: 2 - CMP14+EGFR  2. Tobacco use - Patient just started smoking again about 3 weeks ago.  She does have a history of tobacco use in the past.  Wellbutrin prescribed to help with smoking cessation.  Advised patient to only take once daily for the first 3 days and then she can increase to twice daily. - buPROPion (WELLBUTRIN SR) 150 MG 12 hr tablet; Take 1 tablet (150 mg total) by mouth 2 (two) times daily.  Dispense: 60 tablet; Refill: 2 - CMP14+EGFR  3. Abscess - Diflucan prescribed as patient is prone to yeast infections when she takes an antibiotic. - sulfamethoxazole-trimethoprim (BACTRIM DS) 800-160 MG tablet; Take 1 tablet by mouth 2 (two) times daily for 7 days.  Dispense: 14 tablet; Refill: 0 - fluconazole (DIFLUCAN) 150 MG tablet; Take 1 tablet (150 mg total) by mouth once for 1 dose. May repeat after 3 days if needed.  Dispense: 2 tablet; Refill: 0  4. Easy bruising - Anemia Profile B - CMP14+EGFR  5. Encounter for hepatitis C screening test for low risk patient - Hepatitis C antibody  6. Encounter for screening for HIV - HIV Antibody (routine testing w rflx)   Return in about 6 weeks (around 05/09/2020) for smoking cessation & depression.  Hendricks Limes, MSN, APRN, FNP-C [facility]  Subjective:    Patient ID: Stephanie Dyer, female    DOB: Jan 07, 1976, 45 y.o.   MRN: [id number]  Patient Care Team: Loman Brooklyn, FNP as PCP - General (Family Medicine) Eloise Harman, DO as Consulting Physician (Internal Medicine)   Chief Complaint:  Chief Complaint  Patient presents with  . stop smoking    Chantix rx    . Depression    Patient  would like to be changed from her prozac      HPI: Stephanie Dyer is a 45 y.o. female presenting on 03/28/2020 for stop smoking (Chantix rx /) and Depression (Patient would like to be changed from her prozac /)  Patient is here due to depression and smoking since the passing of her father on March 05, 2020, just over 3 weeks ago.  Patient was started on Prozac several months ago due to anxiety and hot flashes.  She states the medication was working well for her anxiety and she was feeling great prior to the passing of her father.  It did not help with her hot flashes so she started taking black cohosh which did help.  Two days ago she got her 38-monthchip in AA.  She quit smoking marijuana at the same time she quit drinking alcohol.  With this current grief reaction of depression patient started smoking, got her bellybutton pierced, got a tattoo, and got her nipples pierced.  She is currently smoking 2 packs/day.  [facility] 12/04/2019 11/28/2019  Decreased Interest 0 0  Down, Depressed, Hopeless 0 0  PHQ - 2 Score 0 0  Altered sleeping - 2  Tired, decreased energy - 2  Change in appetite - 0  Feeling bad or failure about yourself  - 0  Trouble concentrating - 0  Moving slowly or fidgety/restless - 0  Suicidal thoughts -  0  PHQ-9 Score - 4   GAD 7 : Generalized Anxiety Score 11/28/2019  Nervous, Anxious, on Edge 1  Control/stop worrying 2  Worry too much - different things 2  Trouble relaxing 2  Restless 1  Easily annoyed or irritable 2  Afraid - awful might happen 1  Total GAD 7 Score 11    New complaints: Patient is also concerned about an abscess on her right labia.  She is also requesting lab work as she feels she is bruising easily.  Social history:  Relevant past medical, surgical, family and social history reviewed and updated as indicated. Interim medical history since our last visit reviewed.  Allergies and medications reviewed and updated.  DATA  REVIEWED: CHART IN EPIC  ROS: Negative unless specifically indicated above in HPI.    Current Outpatient Medications:  .  albuterol (VENTOLIN HFA) 108 (90 Base) MCG/ACT inhaler, Inhale 2 puffs into the lungs every 6 (six) hours as needed., Disp: 18 g, Rfl: 4 .  clobetasol ointment (TEMOVATE) 8.36 %, Apply 1 application topically 2 (two) times daily., Disp: 60 g, Rfl: 2 .  clotrimazole-betamethasone (LOTRISONE) cream, Apply 1 application topically 2 (two) times daily., Disp: 45 g, Rfl: 2 .  ELDERBERRY PO, Take by mouth., Disp: , Rfl:  .  EPINEPHrine 0.3 mg/0.3 mL IJ SOAJ injection, Inject 0.3 mg into the muscle as needed., Disp: , Rfl:  .  ergocalciferol (VITAMIN D2) 1.25 MG (50000 UT) capsule, Take 50,000 Units by mouth once a week., Disp: , Rfl:  .  FLUoxetine (PROZAC) 20 MG capsule, Take 1 capsule (20 mg total) by mouth daily., Disp: 30 capsule, Rfl: 2 .  Multiple Vitamins-Minerals (AIRBORNE PO), Take by mouth., Disp: , Rfl:  .  Multiple Vitamins-Minerals (WOMENS MULTI GUMMIES) CHEW, Chew by mouth., Disp: , Rfl:  .  naratriptan (AMERGE) 2.5 MG tablet, Take 1 tablet (2.5 mg total) by mouth as needed for migraine. Take one (1) tablet at onset of headache; if returns or does not resolve, may repeat after 4 hours; do not exceed five (5) mg in 24 hours., Disp: 10 tablet, Rfl: 1 .  OVER THE COUNTER MEDICATION, Power Zinc gummy, Disp: , Rfl:  .  OVER THE COUNTER MEDICATION, Power C gummy, Disp: , Rfl:  .  pantoprazole (PROTONIX) 40 MG tablet, Take 1 tablet (40 mg total) by mouth daily., Disp: 90 tablet, Rfl: 3 .  topiramate (TOPAMAX) 25 MG capsule, Take 3 capsules (75 mg total) by mouth at bedtime., Disp: 90 capsule, Rfl: 1   Allergies  Allergen Reactions  . Codeine Anaphylaxis    Other reaction(s): Projectile vomiting, throat closes  . Morphine Anaphylaxis    Other reaction(s): Projectile vomiting, throat clos  . Other Anaphylaxis    peanuts  . Nsaids Other (See Comments)    Not allowed d/t  gastric bypass.   Past Medical History:  Diagnosis Date  . Angio-edema   . Ankylosing spondylitis ([place])   . Anxiety   . Arthritis   . Asthma   . Cervical cancer ([place]) 1998  . Elevated LFTs   . Environmental allergies   . GERD (gastroesophageal reflux disease)   . History of gastric bypass   . History of iron deficiency anemia   . Hypoglycemia   . Kidney stones   . Lumbar back pain   . Migraines   . PCOS (polycystic ovarian syndrome)   . Pseudotumor cerebri 2006  . Thyroid cyst   . Urticaria   . Vitamin D  deficiency     Past Surgical History:  Procedure Laterality Date  . ABDOMINAL HYSTERECTOMY  10/2018  . ADENOIDECTOMY    . CESAREAN SECTION  2016  . CHOLECYSTECTOMY  1998  . COSMETIC SURGERY  2015  . GASTRIC BYPASS  2014  . KNEE SURGERY Left 2001  . LITHOTRIPSY  2021  . TONSILLECTOMY      Social History   Socioeconomic History  . Marital status: Married    Spouse name: Not on file  . Number of children: Not on file  . Years of education: Not on file  . Highest education level: Not on file  Occupational History  . Not on file  Tobacco Use  . Smoking status: Former Smoker    Types: Cigarettes    Quit date: 11/27/2012    Years since quitting: 7.3  . Smokeless tobacco: Never Used  Vaping Use  . Vaping Use: Never used  Substance and Sexual Activity  . Alcohol use: Not Currently  . Drug use: Yes    Types: Marijuana  . Sexual activity: Yes    Birth control/protection: Other-see comments  Other Topics Concern  . Not on file  Social History Narrative  . Not on file   Social Determinants of Health   Financial Resource Strain: Not on file  Food Insecurity: Not on file  Transportation Needs: Not on file  Physical Activity: Not on file  Stress: Not on file  Social Connections: Not on file  Intimate Partner Violence: Not on file        Objective:    BP 117/75   Pulse 78   Temp 98.1 F (36.7 C) (Temporal)   Ht _0  (1.6 m)   Wt 214 lb 6.4 oz (97.3  kg)   LMP 11/04/2018   SpO2 100%   BMI 37.98 kg/m   Wt Readings from Last 3 Encounters:  03/28/20 214 lb 6.4 oz (97.3 kg)  03/10/20 221 lb (100.2 kg)  01/22/20 240 lb (108.9 kg)    Physical Exam Vitals reviewed.  Constitutional:      General: She is not in acute distress.    Appearance: Normal appearance. She is not ill-appearing, toxic-appearing or diaphoretic.  HENT:     Head: Normocephalic and atraumatic.  Eyes:     General: No scleral icterus.       Right eye: No discharge.        Left eye: No discharge.     Conjunctiva/sclera: Conjunctivae normal.  Cardiovascular:     Rate and Rhythm: Normal rate.  Pulmonary:     Effort: Pulmonary effort is normal. No respiratory distress.  Genitourinary:    Comments: Pea size abscess to right labia. Musculoskeletal:        General: Normal range of motion.     Cervical back: Normal range of motion.  Skin:    General: Skin is warm and dry.     Capillary Refill: Capillary refill takes less than 2 seconds.  Neurological:     General: No focal deficit present.     Mental Status: She is alert and oriented to person, place, and time. Mental status is at baseline.  Psychiatric:        Mood and Affect: Mood normal.        Behavior: Behavior normal.        Thought Content: Thought content normal.        Judgment: Judgment normal.     Lab Results  Component Value Date   TSH 2.640  11/28/2019   Lab Results  Component Value Date   WBC 6.5 11/28/2019   HGB 14.4 11/28/2019   HCT 45.6 11/28/2019   MCV 89 11/28/2019   PLT 208 11/28/2019   Lab Results  Component Value Date   NA 140 11/28/2019   K 4.1 11/28/2019   CO2 21 11/28/2019   GLUCOSE 75 11/28/2019   BUN 11 11/28/2019   CREATININE 0.75 11/28/2019   BILITOT 0.6 11/28/2019   ALKPHOS 180 (H) 11/28/2019   AST 46 (H) 11/28/2019   ALT 142 (H) 11/28/2019   PROT 6.7 11/28/2019   ALBUMIN 3.8 11/28/2019   CALCIUM 8.8 11/28/2019   Lab Results  Component Value Date   CHOL 142  11/28/2019   Lab Results  Component Value Date   HDL 64 11/28/2019   Lab Results  Component Value Date   LDLCALC 64 11/28/2019   Lab Results  Component Value Date   TRIG 68 11/28/2019   Lab Results  Component Value Date   CHOLHDL 2.2 11/28/2019   No results found for: HGBA1C

## 2020-03-29 LAB — ANEMIA PROFILE B
Basophils Absolute: 0 10*3/uL (ref 0.0–0.2)
Basos: 1 %
EOS (ABSOLUTE): 0.6 10*3/uL — ABNORMAL HIGH (ref 0.0–0.4)
Eos: 8 %
Ferritin: 31 ng/mL (ref 15–150)
Folate: 10 ng/mL (ref >3.0)
Hematocrit: 44.7 % (ref 34.0–46.6)
Hemoglobin: 15.1 g/dL (ref 11.1–15.9)
Immature Grans (Abs): 0 10*3/uL (ref 0.0–0.1)
Immature Granulocytes: 0 %
Iron Saturation: 20 % (ref 15–55)
Iron: 71 ug/dL (ref 27–159)
Lymphocytes Absolute: 2.2 10*3/uL (ref 0.7–3.1)
Lymphs: 29 %
MCH: 28.9 pg (ref 26.6–33.0)
MCHC: 33.8 g/dL (ref 31.5–35.7)
MCV: 86 fL (ref 79–97)
Monocytes Absolute: 0.5 10*3/uL (ref 0.1–0.9)
Monocytes: 6 %
Neutrophils Absolute: 4.3 10*3/uL (ref 1.4–7.0)
Neutrophils: 56 %
Platelets: 243 10*3/uL (ref 150–450)
RBC: 5.22 x10E6/uL (ref 3.77–5.28)
RDW: 14.5 % (ref 11.7–15.4)
Retic Ct Pct: 0.9 % (ref 0.6–2.6)
Total Iron Binding Capacity: 347 ug/dL (ref 250–450)
UIBC: 276 ug/dL (ref 131–425)
Vitamin B-12: 213 pg/mL — ABNORMAL LOW (ref 232–1245)
WBC: 7.6 10*3/uL (ref 3.4–10.8)

## 2020-03-29 LAB — CMP14+EGFR
ALT: 11 IU/L (ref 0–32)
AST: 15 IU/L (ref 0–40)
Albumin/Globulin Ratio: 1.5 (ref 1.2–2.2)
Albumin: 4 g/dL (ref 3.8–4.8)
Alkaline Phosphatase: 90 IU/L (ref 44–121)
BUN/Creatinine Ratio: 11 (ref 9–23)
BUN: 7 mg/dL (ref 6–24)
Bilirubin Total: 0.7 mg/dL (ref 0.0–1.2)
CO2: 20 mmol/L (ref 20–29)
Calcium: 9.1 mg/dL (ref 8.7–10.2)
Chloride: 105 mmol/L (ref 96–106)
Creatinine, Ser: 0.66 mg/dL (ref 0.57–1.00)
GFR calc Af Amer: 126 mL/min/{1.73_m2} (ref >59)
GFR calc non Af Amer: 109 mL/min/{1.73_m2} (ref >59)
Globulin, Total: 2.6 g/dL (ref 1.5–4.5)
Glucose: 79 mg/dL (ref 65–99)
Potassium: 4 mmol/L (ref 3.5–5.2)
Sodium: 139 mmol/L (ref 134–144)
Total Protein: 6.6 g/dL (ref 6.0–8.5)

## 2020-03-29 LAB — HEPATITIS C ANTIBODY: Hep C Virus Ab: <0.1 s/co ratio (ref 0.0–0.9)

## 2020-03-29 LAB — HIV ANTIBODY (ROUTINE TESTING W REFLEX): HIV Screen 4th Generation wRfx: NONREACTIVE

## 2020-03-31 ENCOUNTER — Encounter: Payer: Self-pay | Admitting: Family Medicine

## 2020-03-31 DIAGNOSIS — E538 Deficiency of other specified B group vitamins: Secondary | ICD-10-CM

## 2020-03-31 HISTORY — DX: Deficiency of other specified B group vitamins: E53.8

## 2020-04-03 ENCOUNTER — Ambulatory Visit: Payer: Medicare Other | Admitting: Physical Therapy

## 2020-04-03 ENCOUNTER — Encounter: Payer: Self-pay | Admitting: Physical Therapy

## 2020-04-03 ENCOUNTER — Other Ambulatory Visit: Payer: Self-pay

## 2020-04-03 DIAGNOSIS — N941 Unspecified dyspareunia: Secondary | ICD-10-CM

## 2020-04-03 DIAGNOSIS — R293 Abnormal posture: Secondary | ICD-10-CM

## 2020-04-03 DIAGNOSIS — R252 Cramp and spasm: Secondary | ICD-10-CM

## 2020-04-03 NOTE — Therapy (Signed)
[facility] [address], [place], [place], [address] Phone: [phone]   Fax:  [phone]  Physical Therapy Treatment  Patient Details  Name: Stephanie Dyer MRN: [id number] Date of Birth: 04/06/1975 Referring Provider (PT): Loman Brooklyn, FNP   Encounter Date: 04/03/2020   PT End of Session - 04/03/20 1109    Visit Number 3    Date for PT Re-Evaluation 05/07/20    Authorization Type medicare A/B    PT Start Time 1104    PT Stop Time 1146    PT Time Calculation (min) 42 min    Activity Tolerance Patient tolerated treatment well    Behavior During Therapy [facility] for tasks assessed/performed           Past Medical History:  Diagnosis Date  . Angio-edema   . Ankylosing spondylitis ([place])   . Anxiety   . Arthritis   . Asthma   . Cervical cancer ([place]) 1998  . Elevated LFTs   . Environmental allergies   . GERD (gastroesophageal reflux disease)   . History of gastric bypass   . History of iron deficiency anemia   . Hypoglycemia   . Kidney stones   . Lumbar back pain   . Migraines   . PCOS (polycystic ovarian syndrome)   . Pseudotumor cerebri 2006  . Thyroid cyst   . Urticaria   . Vitamin B12 deficiency 03/31/2020  . Vitamin D deficiency     Past Surgical History:  Procedure Laterality Date  . ABDOMINAL HYSTERECTOMY  10/2018  . ADENOIDECTOMY    . CESAREAN SECTION  2016  . CHOLECYSTECTOMY  1998  . COSMETIC SURGERY  2015  . GASTRIC BYPASS  2014  . KNEE SURGERY Left 2001  . LITHOTRIPSY  2021  . TONSILLECTOMY      There were no vitals filed for this visit.   Subjective Assessment - 04/03/20 1108    Subjective Pt states the vaginal pain is not there and the orgasms are better.  But with stronger orgasms there are worse headaches and has had some double vision    Patient Stated Goals stop having HA and reduce pain with intercourse    Currently in Pain? No/denies                              [facility] Adult PT Treatment/Exercise - 04/03/20 0001      Exercises   Exercises Neck      Neck Exercises: Seated   Other Seated Exercise retraction and upper trap stretch      Manual Therapy   Manual therapy comments pt identity confirmed and informed consent    Soft tissue mobilization cervical multifidi, suboccipitals, upper traps    Internal Pelvic Floor fascial release and levators            Trigger Point Dry Needling - 04/03/20 0001    Consent Given? Yes    Education Handout Provided --   verbally   Muscles Treated Head and Neck Suboccipitals;Cervical multifidi    Suboccipitals Response Palpable increased muscle length;Twitch response elicited    Cervical multifidi Response Twitch reponse elicited;Palpable increased muscle length                PT Education - 04/03/20 1147    Education Details Access Code: [facility]    Person(s) Educated Patient    Methods Explanation;Demonstration;Tactile cues;Verbal cues;Handout    Comprehension Verbalized understanding;Returned demonstration  PT Short Term Goals - 04/03/20 1111      PT SHORT TERM GOAL #1   Title ind with intial self stretch to pelvic floor    Baseline using vitamin e    Status Achieved             PT Long Term Goals - 04/03/20 1111      PT LONG TERM GOAL #1   Title Pt will be ind with techniques for stretching and breathing to reduce pain                 Plan - 04/03/20 1358    Clinical Impression Statement Pt has made some progress with HEP and stretching and moisturizing pelvic floor.  Pt had good release with internal STM.  She had tension in obdurator on Lt side and fascial restrictions on the Rt side.  Pt has tension in the cervical muscles as well.  Based on symptoms she seems to have muscle spasms in the cervical region along with strong pelvic floor contractions  Soft tissue mobs and dry needling done with stretches to maintain  lengthened soft tissues given today.  Pt will benefit from skilled PT to continue to address muscle tensions and impairment.    PT Treatment/Interventions ADLs/Self Care Home Management;Biofeedback;Cryotherapy;Electrical Stimulation;Moist Heat;Gait training;Stair training;Functional mobility training;Therapeutic activities;Therapeutic exercise;Neuromuscular re-education;Patient/family education;Manual techniques;Taping;Dry needling;Passive range of motion;Scar mobilization    PT Next Visit Plan f/u on cervical stretches and dry needling to c-spine    PT Home Exercise Plan moisturizer and self massage; Access Code: [place]    Consulted and Agree with Plan of Care Patient           Patient will benefit from skilled therapeutic intervention in order to improve the following deficits and impairments:  Pain,Postural dysfunction,Impaired flexibility,Increased fascial restricitons,Decreased strength,Decreased coordination,Decreased range of motion,Obesity,Improper body mechanics,Decreased scar mobility  Visit Diagnosis: Abnormal posture  Female dyspareunia  Cramp and spasm     Problem List Patient Active Problem List   Diagnosis Date Noted  . Vitamin B12 deficiency 03/31/2020  . Other allergic rhinitis 01/22/2020  . Adverse food reaction 01/22/2020  . History of asthma 01/22/2020  . Multiple drug allergies 01/22/2020  . History of systemic reaction to hymenoptera sting 01/22/2020  . Marijuana use 12/09/2019  . Generalized anxiety disorder 12/06/2019  . Hot flashes 12/06/2019  . Pain in female genitalia on intercourse 12/06/2019  . Ankylosing spondylitis ([place])   . Thyroid cyst   . Asthma   . Environmental allergies   . Migraines   . Vitamin D deficiency   . Elevated LFTs   . Lumbar back pain   . Anxiety   . PCOS (polycystic ovarian syndrome)   . GERD (gastroesophageal reflux disease)     Jule Ser, PT 04/03/2020, 2:48 PM  [facility] [address] [place], [place], [address] Phone: [phone]   Fax:  [phone]  Name: Solange Emry MRN: [id number] Date of Birth: 04-30-1975

## 2020-04-03 NOTE — Patient Instructions (Signed)
Access Code: [facility] URL: [email or web address] Date: 04/03/2020 Prepared by: Jari Favre  Exercises Hooklying Small March - 1 x daily - 7 x weekly - 2 sets - 10 reps Seated Cervical Sidebending Stretch - 1 x daily - 7 x weekly - 3 sets - 10 reps Seated Chin Tuck with Neck Elongation - 1 x daily - 7 x weekly - 3 sets - 10 reps

## 2020-04-04 ENCOUNTER — Other Ambulatory Visit: Payer: Self-pay | Admitting: Family Medicine

## 2020-04-04 DIAGNOSIS — R232 Flushing: Secondary | ICD-10-CM

## 2020-04-04 DIAGNOSIS — F411 Generalized anxiety disorder: Secondary | ICD-10-CM

## 2020-04-07 ENCOUNTER — Encounter: Payer: Self-pay | Admitting: Physical Therapy

## 2020-04-07 ENCOUNTER — Other Ambulatory Visit: Payer: Self-pay

## 2020-04-07 ENCOUNTER — Ambulatory Visit: Payer: Medicare Other | Admitting: Physical Therapy

## 2020-04-07 DIAGNOSIS — N941 Unspecified dyspareunia: Secondary | ICD-10-CM

## 2020-04-07 DIAGNOSIS — R293 Abnormal posture: Secondary | ICD-10-CM | POA: Diagnosis not present

## 2020-04-07 DIAGNOSIS — R252 Cramp and spasm: Secondary | ICD-10-CM

## 2020-04-07 NOTE — Therapy (Addendum)
[facility] [address] [place], [place], [address] Phone: [phone]   Fax:  [phone]  Physical Therapy Treatment  Patient Details  Name: Stephanie Dyer MRN: [id number] Date of Birth: 07-06-1975 Referring Provider (PT): Loman Brooklyn, FNP   Encounter Date: 04/07/2020   PT End of Session - 04/07/20 1142    Visit Number 4    Date for PT Re-Evaluation 05/07/20    Authorization Type medicare A/B    PT Start Time 1104    PT Stop Time 1144    PT Time Calculation (min) 40 min    Activity Tolerance Patient tolerated treatment well    Behavior During Therapy [facility] for tasks assessed/performed           Past Medical History:  Diagnosis Date  . Angio-edema   . Ankylosing spondylitis ([place])   . Anxiety   . Arthritis   . Asthma   . Cervical cancer ([place]) 1998  . Elevated LFTs   . Environmental allergies   . GERD (gastroesophageal reflux disease)   . History of gastric bypass   . History of iron deficiency anemia   . Hypoglycemia   . Kidney stones   . Lumbar back pain   . Migraines   . PCOS (polycystic ovarian syndrome)   . Pseudotumor cerebri 2006  . Thyroid cyst   . Urticaria   . Vitamin B12 deficiency 03/31/2020  . Vitamin D deficiency     Past Surgical History:  Procedure Laterality Date  . ABDOMINAL HYSTERECTOMY  10/2018  . ADENOIDECTOMY    . CESAREAN SECTION  2016  . CHOLECYSTECTOMY  1998  . COSMETIC SURGERY  2015  . GASTRIC BYPASS  2014  . KNEE SURGERY Left 2001  . LITHOTRIPSY  2021  . TONSILLECTOMY      There were no vitals filed for this visit.   Subjective Assessment - 04/07/20 1137    Subjective Pt states she had no HA today after having intercourse which hasn't happened for a long time.    Patient Stated Goals stop having HA and reduce pain with intercourse    Currently in Pain? No/denies                             [facility] Adult PT Treatment/Exercise - 04/07/20  0001      Exercises   Exercises Lumbar      Lumbar Exercises: Stretches   Other Lumbar Stretch Exercise child pose      Lumbar Exercises: Sidelying   Other Sidelying Lumbar Exercises thoracic rotation open book stretch      Lumbar Exercises: Quadruped   Single Arm Raise 10 reps   cues to keep spine neutral   Other Quadruped Lumbar Exercises Transverse abs activation      Manual Therapy   Soft tissue mobilization cervical multifidi, suboccipitals, upper traps; thoracic paraspinals            Trigger Point Dry Needling - 04/07/20 0001    Consent Given? Yes    Education Handout Provided Previously provided    Muscles Treated Back/Hip Thoracic multifidi    Thoracic multifidi response Twitch response elicited;Palpable increased muscle length                PT Education - 04/07/20 1148    Education Details Access Code: [facility]    Person(s) Educated Patient    Methods Explanation;Demonstration;Tactile cues;Verbal cues;Handout    Comprehension Verbalized  understanding;Returned demonstration            PT Short Term Goals - 04/03/20 1111      PT SHORT TERM GOAL #1   Title ind with intial self stretch to pelvic floor    Baseline using vitamin e    Status Achieved             PT Long Term Goals - 04/03/20 1111      PT LONG TERM GOAL #1   Title Pt will be ind with techniques for stretching and breathing to reduce pain                 Plan - 04/07/20 1145    Clinical Impression Statement Pt responded well to dry needling and STM today.  Pt has made excellent progress with reduced pain during and after intercourse as she was intimate with her partner today and had no HA or pain.  Pt had tension in thoracic spine paraspinals and multifidi that was release wtih STM and dry needling.  Pt was educated on core strength porgressions in quadruped.  Pt will benefit from skilled PT to continue to work on core strength and ensure she is able to perform correct  techniques for funcitonal activities.    PT Treatment/Interventions ADLs/Self Care Home Management;Biofeedback;Cryotherapy;Electrical Stimulation;Moist Heat;Gait training;Stair training;Functional mobility training;Therapeutic activities;Therapeutic exercise;Neuromuscular re-education;Patient/family education;Manual techniques;Taping;Dry needling;Passive range of motion;Scar mobilization    PT Next Visit Plan f/u on goals and core progression for lifting and functional movements    PT Home Exercise Plan moisturizer and self massage; Access Code: [place]    Consulted and Agree with Plan of Care Patient           Patient will benefit from skilled therapeutic intervention in order to improve the following deficits and impairments:  Pain,Postural dysfunction,Impaired flexibility,Increased fascial restricitons,Decreased strength,Decreased coordination,Decreased range of motion,Obesity,Improper body mechanics,Decreased scar mobility  Visit Diagnosis: Abnormal posture  Female dyspareunia  Cramp and spasm     Problem List Patient Active Problem List   Diagnosis Date Noted  . Vitamin B12 deficiency 03/31/2020  . Other allergic rhinitis 01/22/2020  . Adverse food reaction 01/22/2020  . History of asthma 01/22/2020  . Multiple drug allergies 01/22/2020  . History of systemic reaction to hymenoptera sting 01/22/2020  . Marijuana use 12/09/2019  . Generalized anxiety disorder 12/06/2019  . Hot flashes 12/06/2019  . Pain in female genitalia on intercourse 12/06/2019  . Ankylosing spondylitis ([place])   . Thyroid cyst   . Asthma   . Environmental allergies   . Migraines   . Vitamin D deficiency   . Elevated LFTs   . Lumbar back pain   . Anxiety   . PCOS (polycystic ovarian syndrome)   . GERD (gastroesophageal reflux disease)     Jule Ser, PT 04/07/2020, 12:15 PM  [facility] [address] [place], [place],  [address] Phone: [phone]   Fax:  [phone]  Name: Stephanie Dyer MRN: [id number] Date of Birth: [birth year]  PHYSICAL THERAPY DISCHARGE SUMMARY  Visits from Start of Care: 4  Current functional level related to goals / functional outcomes: See above details   Remaining deficits: See above   Education / Equipment: HEP  Plan: Patient agrees to discharge.  Patient goals were not met. Patient is being discharged due to not returning since the last visit.  ?????    [employer], PT 06/05/20 2:31 PM

## 2020-04-07 NOTE — Patient Instructions (Signed)
Access Code: [facility] URL: [email or web address] Date: 04/07/2020 Prepared by: Jari Favre  Exercises Hooklying Small March - 1 x daily - 7 x weekly - 2 sets - 10 reps Seated Cervical Sidebending Stretch - 1 x daily - 7 x weekly - 3 sets - 10 reps Seated Chin Tuck with Neck Elongation - 1 x daily - 7 x weekly - 3 sets - 10 reps Quadruped Alternating Arm Lift - 1 x daily - 7 x weekly - 2 sets - 10 reps

## 2020-04-17 ENCOUNTER — Ambulatory Visit (INDEPENDENT_AMBULATORY_CARE_PROVIDER_SITE_OTHER): Payer: Medicare Other | Admitting: Family Medicine

## 2020-04-17 ENCOUNTER — Encounter: Payer: Self-pay | Admitting: Family Medicine

## 2020-04-17 ENCOUNTER — Other Ambulatory Visit: Payer: Self-pay

## 2020-04-17 VITALS — BP 115/78 | HR 99 | Temp 98.2°F | Ht 63.0 in | Wt 208.0 lb

## 2020-04-17 DIAGNOSIS — H538 Other visual disturbances: Secondary | ICD-10-CM

## 2020-04-17 DIAGNOSIS — G4482 Headache associated with sexual activity: Secondary | ICD-10-CM

## 2020-04-17 NOTE — Progress Notes (Signed)
Assessment & Plan:  1. Orgasmic headache MRI first.  As long as it is negative we will treat with medication. - MR Brain Wo Contrast; Future  2. Blurred vision MRI first.  As long as it is negative we will treat with medication.  Encourage patient to establish with an eye doctor here in [place]. - MR Brain Wo Contrast; Future   Follow up plan: Return if symptoms worsen or fail to improve.  Hendricks Limes, MSN, APRN, FNP-C [facility]  Subjective:   Patient ID: Stephanie Dyer, female    DOB: 1976-01-01, 45 y.o.   MRN: [id number]  HPI: Stephanie Dyer is a 45 y.o. female presenting on 04/17/2020 for Headache (Patient states that this has been going on after years she has intercourse and has an orgasm.  States it only lasts a few mins after.  She has also been having blurred vision after orgasm that only lasts a few mins.  Cystal Shannahan sent patient to pelvic specialist and they told her they have never heard of it. )  Patient reports she has been having headaches after an orgasm for years.  They only last a few minutes.  She reports that recently she started having blurred vision after an orgasm that also only lasts for a few minutes.  She reports the intensity of her headache and blurred vision is related to the intensity of her orgasms.  If she has multiple orgasms back to back her headache and blurred vision are worse.  She does go to the eye doctor as she has glasses and was last seen 9 months ago.   ROS: Negative unless specifically indicated above in HPI.   Relevant past medical history reviewed and updated as indicated.   Allergies and medications reviewed and updated.   Current Outpatient Medications:  .  albuterol (VENTOLIN HFA) 108 (90 Base) MCG/ACT inhaler, Inhale 2 puffs into the lungs every 6 (six) hours as needed., Disp: 18 g, Rfl: 4 .  buPROPion (WELLBUTRIN SR) 150 MG 12 hr tablet, Take 1 tablet (150 mg total) by mouth 2 (two) times daily., Disp: 60  tablet, Rfl: 2 .  clobetasol ointment (TEMOVATE) 5.28 %, Apply 1 application topically 2 (two) times daily., Disp: 60 g, Rfl: 2 .  clotrimazole-betamethasone (LOTRISONE) cream, Apply 1 application topically 2 (two) times daily., Disp: 45 g, Rfl: 2 .  ELDERBERRY PO, Take by mouth., Disp: , Rfl:  .  EPINEPHrine 0.3 mg/0.3 mL IJ SOAJ injection, Inject 0.3 mg into the muscle as needed., Disp: , Rfl:  .  ergocalciferol (VITAMIN D2) 1.25 MG (50000 UT) capsule, Take 50,000 Units by mouth once a week., Disp: , Rfl:  .  FLUoxetine (PROZAC) 20 MG capsule, Take 1 capsule by mouth once daily, Disp: 30 capsule, Rfl: 0 .  Multiple Vitamins-Minerals (AIRBORNE PO), Take by mouth., Disp: , Rfl:  .  Multiple Vitamins-Minerals (WOMENS MULTI GUMMIES) CHEW, Chew by mouth., Disp: , Rfl:  .  naratriptan (AMERGE) 2.5 MG tablet, Take 1 tablet (2.5 mg total) by mouth as needed for migraine. Take one (1) tablet at onset of headache; if returns or does not resolve, may repeat after 4 hours; do not exceed five (5) mg in 24 hours., Disp: 10 tablet, Rfl: 1 .  OVER THE COUNTER MEDICATION, Power Zinc gummy, Disp: , Rfl:  .  OVER THE COUNTER MEDICATION, Power C gummy, Disp: , Rfl:  .  pantoprazole (PROTONIX) 40 MG tablet, Take 1 tablet (40 mg total) by mouth daily.,  Disp: 90 tablet, Rfl: 3 .  topiramate (TOPAMAX) 25 MG capsule, Take 3 capsules (75 mg total) by mouth at bedtime., Disp: 90 capsule, Rfl: 1  Allergies  Allergen Reactions  . Codeine Anaphylaxis    Other reaction(s): Projectile vomiting, throat closes  . Morphine Anaphylaxis    Other reaction(s): Projectile vomiting, throat clos  . Other Anaphylaxis    peanuts  . Fioricet [Butalbital-Apap-Caffeine] Other (See Comments)    Headache  . Nsaids Other (See Comments)    Not allowed d/t gastric bypass.  . Shellfish Allergy Swelling  . Vancomycin Itching and Other (See Comments)    Itching & flushed skin.  . Venlafaxine Other (See Comments)    GI upset  . Zolpidem  Other (See Comments)    Parasomnia  . Latex Rash    Objective:   BP 115/78   Pulse 99   Temp 98.2 F (36.8 C) (Temporal)   Ht 5\' 3"  (1.6 m)   Wt 208 lb (94.3 kg)   LMP 11/04/2018   SpO2 96%   BMI 36.85 kg/m    Physical Exam Vitals reviewed.  Constitutional:      General: She is not in acute distress.    Appearance: Normal appearance. She is not ill-appearing, toxic-appearing or diaphoretic.  HENT:     Head: Normocephalic and atraumatic.  Eyes:     General: No scleral icterus.       Right eye: No discharge.        Left eye: No discharge.     Conjunctiva/sclera: Conjunctivae normal.  Cardiovascular:     Rate and Rhythm: Normal rate.  Pulmonary:     Effort: Pulmonary effort is normal. No respiratory distress.  Musculoskeletal:        General: Normal range of motion.     Cervical back: Normal range of motion.  Skin:    General: Skin is warm and dry.     Capillary Refill: Capillary refill takes less than 2 seconds.  Neurological:     General: No focal deficit present.     Mental Status: She is alert and oriented to person, place, and time. Mental status is at baseline.  Psychiatric:        Mood and Affect: Mood normal.        Behavior: Behavior normal.        Thought Content: Thought content normal.        Judgment: Judgment normal.

## 2020-04-30 ENCOUNTER — Ambulatory Visit (HOSPITAL_COMMUNITY)
Admission: RE | Admit: 2020-04-30 | Discharge: 2020-04-30 | Disposition: A | Discharge status: Home / Self Care | Payer: Medicare Other | Source: Ambulatory Visit | Attending: Family Medicine | Admitting: Family Medicine

## 2020-04-30 ENCOUNTER — Other Ambulatory Visit: Payer: Self-pay

## 2020-04-30 DIAGNOSIS — H538 Other visual disturbances: Secondary | ICD-10-CM | POA: Insufficient documentation

## 2020-04-30 DIAGNOSIS — G4482 Headache associated with sexual activity: Secondary | ICD-10-CM | POA: Diagnosis not present

## 2020-05-01 ENCOUNTER — Encounter: Payer: Self-pay | Admitting: Family Medicine

## 2020-05-01 DIAGNOSIS — G4482 Headache associated with sexual activity: Secondary | ICD-10-CM

## 2020-05-02 MED ORDER — PROPRANOLOL HCL 40 MG PO TABS: 40.0000 mg | ORAL_TABLET | Freq: Every day | ORAL | 2 refills | Status: DC

## 2020-05-04 ENCOUNTER — Other Ambulatory Visit: Payer: Self-pay | Admitting: Nurse Practitioner

## 2020-05-04 ENCOUNTER — Other Ambulatory Visit: Payer: Self-pay | Admitting: Family Medicine

## 2020-05-04 DIAGNOSIS — G43109 Migraine with aura, not intractable, without status migrainosus: Secondary | ICD-10-CM

## 2020-05-04 DIAGNOSIS — R232 Flushing: Secondary | ICD-10-CM

## 2020-05-04 DIAGNOSIS — F411 Generalized anxiety disorder: Secondary | ICD-10-CM

## 2020-05-12 ENCOUNTER — Ambulatory Visit: Payer: Medicare Other | Admitting: Family Medicine

## 2020-05-12 ENCOUNTER — Telehealth: Payer: Self-pay

## 2020-05-12 NOTE — Telephone Encounter (Signed)
Please advise 

## 2020-05-12 NOTE — Telephone Encounter (Signed)
Since she is not currently on medication and their is not a diagnosis on her chart I recommend a telephone visit.

## 2020-05-12 NOTE — Telephone Encounter (Signed)
  Prescription Request  05/12/2020  What is the name of the medication or equipment? Pt has an outbreak of cold sores and she is out of town.  Have you contacted your pharmacy to request a refill? (if applicable) no  Which pharmacy would you like this sent to? [facility] / [place] [phone]   Patient notified that their request is being sent to the clinical staff for review and that they should receive a response within 2 business days.

## 2020-05-13 ENCOUNTER — Ambulatory Visit (INDEPENDENT_AMBULATORY_CARE_PROVIDER_SITE_OTHER): Payer: Medicare Other | Admitting: Family Medicine

## 2020-05-13 ENCOUNTER — Encounter: Payer: Self-pay | Admitting: Family Medicine

## 2020-05-13 DIAGNOSIS — B001 Herpesviral vesicular dermatitis: Secondary | ICD-10-CM | POA: Diagnosis not present

## 2020-05-13 MED ORDER — VALACYCLOVIR HCL 1 G PO TABS: 2000.0000 mg | ORAL_TABLET | Freq: Two times a day (BID) | ORAL | 0 refills | Status: AC

## 2020-05-13 NOTE — Progress Notes (Signed)
Subjective:    Patient ID: Stephanie Dyer, female    DOB: [birth year], 45 y.o.   MRN: [id number]   HPI: Stephanie Dyer is a 45 y.o. female presenting for cold sores on her lips.  She says they are all over her lips.  They started a few days ago just in 1 place on the left lower lip.  Unfortunately even with use of Abreva they have spread.  She has been getting these ever since she was a child.  She says the current outbreak is one of the worst ever.  She is traveling in [place] because she is attending a friend's funeral.  While she was there her grandfather fell and broke his hip.  Between the travel stress and the event stress she feels that this has led to her outbreak.  She has no other symptoms of URI etc.   Depression screen [facility] 2/9 03/28/2020 12/04/2019 11/28/2019  Decreased Interest 3 0 0  Down, Depressed, Hopeless 3 0 0  PHQ - 2 Score 6 0 0  Altered sleeping 3 - 2  Tired, decreased energy 3 - 2  Change in appetite 3 - 0  Feeling bad or failure about yourself  2 - 0  Trouble concentrating 3 - 0  Moving slowly or fidgety/restless 3 - 0  Suicidal thoughts 0 - 0  PHQ-9 Score 23 - 4  Difficult doing work/chores Extremely dIfficult - -     Relevant past medical, surgical, family and social history reviewed and updated as indicated.  Interim medical history since our last visit reviewed. Allergies and medications reviewed and updated.  ROS:  Review of Systems  Constitutional: Negative.  Negative for fever.  HENT: Negative.   Respiratory: Negative for shortness of breath.   Cardiovascular: Negative for chest pain.  Musculoskeletal: Negative for arthralgias.     Social History   Tobacco Use  Smoking Status Former Smoker  . Types: Cigarettes  . Quit date: 11/27/2012  . Years since quitting: 7.4  Smokeless Tobacco Never Used       Objective:     Wt Readings from Last 3 Encounters:  04/17/20 208 lb (94.3 kg)  03/28/20 214 lb 6.4 oz (97.3 kg)  03/10/20 221 lb (100.2 kg)      Exam deferred. Pt. Harboring due to COVID 19. Phone visit performed.   Assessment & Plan:   1. Herpes labialis   2. Cold sore     Meds ordered this encounter  Medications  . valACYclovir (VALTREX) 1000 MG tablet    Sig: Take 2 tablets (2,000 mg total) by mouth every 12 (twelve) hours for 1 day.    Dispense:  4 tablet    Refill:  0    No orders of the defined types were placed in this encounter.     Diagnoses and all orders for this visit:  Herpes labialis  Cold sore -     valACYclovir (VALTREX) 1000 MG tablet; Take 2 tablets (2,000 mg total) by mouth every 12 (twelve) hours for 1 day.    Virtual Visit via telephone Note  I discussed the limitations, risks, security and privacy concerns of performing an evaluation and management service by telephone and the availability of in person appointments. The patient was identified with two identifiers. Pt.expressed understanding and agreed to proceed. Pt. Is at home. Dr. Livia Snellen is in his office.  Follow Up Instructions:   I discussed the assessment and treatment plan with the patient. The patient was provided an opportunity  to ask questions and all were answered. The patient agreed with the plan and demonstrated an understanding of the instructions.   The patient was advised to call back or seek an in-person evaluation if the symptoms worsen or if the condition fails to improve as anticipated.   Total minutes including chart review and phone contact time: 8   Follow up plan: Return if symptoms worsen or fail to improve.  Claretta Fraise, MD [place]

## 2020-05-13 NOTE — Telephone Encounter (Signed)
Left message to call back  

## 2020-05-13 NOTE — Telephone Encounter (Signed)
Patient returned call.  Scheduled her a phone visit this afternoon with Dr. Livia Snellen.

## 2020-06-16 ENCOUNTER — Ambulatory Visit (INDEPENDENT_AMBULATORY_CARE_PROVIDER_SITE_OTHER): Payer: Medicare Other | Admitting: Family Medicine

## 2020-06-16 ENCOUNTER — Other Ambulatory Visit: Payer: Self-pay | Admitting: Family Medicine

## 2020-06-16 ENCOUNTER — Other Ambulatory Visit: Payer: Self-pay

## 2020-06-16 ENCOUNTER — Encounter: Payer: Self-pay | Admitting: Family Medicine

## 2020-06-16 ENCOUNTER — Other Ambulatory Visit (HOSPITAL_COMMUNITY)
Admission: RE | Admit: 2020-06-16 | Discharge: 2020-06-16 | Disposition: A | Discharge status: Home / Self Care | Payer: Medicare Other | Source: Ambulatory Visit | Attending: Family Medicine | Admitting: Family Medicine

## 2020-06-16 VITALS — BP 103/68 | HR 68 | Temp 97.5°F | Ht 63.0 in | Wt 211.8 lb

## 2020-06-16 DIAGNOSIS — R300 Dysuria: Secondary | ICD-10-CM | POA: Diagnosis not present

## 2020-06-16 DIAGNOSIS — B9689 Other specified bacterial agents as the cause of diseases classified elsewhere: Secondary | ICD-10-CM

## 2020-06-16 DIAGNOSIS — Z113 Encounter for screening for infections with a predominantly sexual mode of transmission: Secondary | ICD-10-CM

## 2020-06-16 DIAGNOSIS — Z7251 High risk heterosexual behavior: Secondary | ICD-10-CM

## 2020-06-16 DIAGNOSIS — N760 Acute vaginitis: Secondary | ICD-10-CM | POA: Diagnosis not present

## 2020-06-16 LAB — URINALYSIS, COMPLETE
Bilirubin, UA: NEGATIVE
Glucose, UA: NEGATIVE
Leukocytes,UA: NEGATIVE
Nitrite, UA: NEGATIVE
Protein,UA: NEGATIVE
RBC, UA: NEGATIVE
Specific Gravity, UA: 1.025 (ref 1.005–1.030)
Urobilinogen, Ur: 0.2 mg/dL (ref 0.2–1.0)
pH, UA: 5 (ref 5.0–7.5)

## 2020-06-16 LAB — WET PREP FOR TRICH, YEAST, CLUE
Clue Cell Exam: POSITIVE — AB
Trichomonas Exam: NEGATIVE
Yeast Exam: NEGATIVE

## 2020-06-16 LAB — MICROSCOPIC EXAMINATION: RBC, Urine: NONE SEEN /hpf (ref 0–2)

## 2020-06-16 MED ORDER — METRONIDAZOLE 500 MG PO TABS: 500.0000 mg | ORAL_TABLET | Freq: Two times a day (BID) | ORAL | 0 refills | Status: AC

## 2020-06-16 NOTE — Patient Instructions (Signed)
Preventing Sexually Transmitted Infections, Adult Sexually transmitted infections (STIs) are diseases that are spread from person to person (are contagious). They are spread, or transmitted, through bodily fluids exchanged during sex or sexual contact. These bodily fluids include saliva, semen, blood, vaginal mucus, and urine. STIs are very common among people of all ages. Some common STIs include:  Herpes.  Hepatitis B.  Chlamydia.  Gonorrhea.  Syphilis.  HPV (human papillomavirus).  HIV, also called the human immunodeficiency virus. This is the virus that can cause AIDS (acquired immunodeficiency syndrome). Often, people who have these STIs do not have symptoms. Even without symptoms, these infections can be spread from person to person and require treatment. How can these conditions affect me? STIs can be treated, and many STIs can be cured. However, some STIs cannot be cured and will affect you for the rest of your life. Certain STIs may:  Require you to take medicine for the rest of your life.  Affect your ability to have children (your fertility).  Increase your risk for developing another STI or certain serious health conditions. These may include: ? Cervical cancer. ? Head and neck cancer. ? Pelvic inflammatory disease (PID), in women. ? Organ damage or damage to other parts of your body, if the infection spreads.  Cause problems during pregnancy and may be transmitted to the baby during the pregnancy or childbirth. What can increase my risk? You may have an increased risk for developing an STI if:  You have unprotected sex. Sex includes oral, vaginal, or anal sex.  You have more than one sex partner.  You have a sex partner who has multiple sex partners.  You have sex with someone who has an STI.  You have an STI or you had an STI before.  You inject drugs or have a sex partner or partners who inject drugs. What actions can I take to prevent STIs? The only way  to completely prevent STIs is not to have sex of any kind. This is called practicing abstinence. If you are sexually active, you can protect yourself and others by taking these actions to lower your risk of getting an STI: Lifestyle Avoid mixing alcohol, drugs, and sex. Alcohol and drug use can affect your ability to make good decisions and can lead to risky sexual behaviors. Medicines Ask your health care provider about taking pre-exposure prophylaxis (PrEP) to prevent HIV infection. General information  Stay up to date on vaccinations. Certain vaccines can lower your risk of getting certain STIs, such as: ? Hepatitis A and hepatitis B vaccines. You may have been vaccinated as a young child, but you will likely need a booster shot as a teen or young adult. ? HPV (human papillomavirus) vaccine.  Have only one sex partner (be monogamous) or limit the number of sex partners you have.  Use methods that prevent the exchange of body fluids between partners (barrier protection) correctly every time you have sex. Barrier protection can be used during oral, vaginal, or anal sex. Commonly used barrier methods include: ? Female condom. ? Female condom. ? Dental dam.  Use a new condom for every sex act from start to finish.  Get tested for STIs. Have your partners get tested, too.  If you test positive for an STI, follow recommendations from your health care provider about treatment and make sure your sex partners are tested and treated.  Birth control pills, injections, implants, and intrauterine devices (IUDs) do not protect against STIs. To prevent both STIs and  pregnancy, always use a condom with another form of birth control.  Some STIs, such as herpes, are spread through skin-to-skin contact. A condom may not protect you from getting such STIs. Avoid all sexual contact if you or your partners have herpes and there is an active flare with open sores.   Where to find more information Learn more  about STIs from:  Centers for Disease Control and Prevention: ? More information about specific STIs: https://price.info/ ? Places to get sexual health counseling and treatment for free or at a low cost: gettested.StoreMirror.com.cy  [place] Department of Health and Human Services: [place].[place] Summary  Sexually transmitted infections (STIs) can spread through exchanging bodily fluids during sexual contact. Fluids include saliva, semen, blood, vaginal mucus, and urine.  You may have an increased risk for developing an STI if you have unprotected sex. Sex includes oral, vaginal, or anal sex.  If you do have sex, limit your number of sex partners and use barrier protection every time you have sex. This information is not intended to replace advice given to you by your health care provider. Make sure you discuss any questions you have with your health care provider. Document Revised: 04/09/2019 Document Reviewed: 04/09/2019 Elsevier Patient Education  2021 [employer].

## 2020-06-16 NOTE — Progress Notes (Signed)
Acute Office Visit  Subjective:    Patient ID: Stephanie Dyer, female    DOB: 1975/09/15, 45 y.o.   MRN: [id number]  Chief Complaint  Patient presents with  . STD TESTING    HPI Patient is in today for STD screening. She denies known exposure. She recently had some vaginal itching and dysuria with vaginal irritation. She noticed that a new soap she was using had coconut in it, to which she is allergic to. When she stopped using this, her symptoms resolved. She had unprotected sex 2-3 days ago. She reports 2 partners in the last 6 months.   Past Medical History:  Diagnosis Date  . Angio-edema   . Ankylosing spondylitis ([place])   . Anxiety   . Arthritis   . Asthma   . Cervical cancer ([place]) 1998  . Elevated LFTs   . Environmental allergies   . GERD (gastroesophageal reflux disease)   . History of gastric bypass   . History of iron deficiency anemia   . Hypoglycemia   . Kidney stones   . Lumbar back pain   . Migraines   . PCOS (polycystic ovarian syndrome)   . Pseudotumor cerebri 2006  . Thyroid cyst   . Urticaria   . Vitamin B12 deficiency 03/31/2020  . Vitamin D deficiency     Past Surgical History:  Procedure Laterality Date  . ABDOMINAL HYSTERECTOMY  10/2018  . ADENOIDECTOMY    . CESAREAN SECTION  2016  . CHOLECYSTECTOMY  1998  . COSMETIC SURGERY  2015  . GASTRIC BYPASS  2014  . KNEE SURGERY Left 2001  . LITHOTRIPSY  2021  . TONSILLECTOMY      Family History  Problem Relation Age of Onset  . Alcohol abuse Mother   . Diabetes Mother   . COPD Mother   . Emphysema Mother   . Asthma Mother   . Heart disease Father   . Heart attack Father   . CVA Brother   . Diabetes Brother   . Hypertension Brother   . Autism Son   . Hearing loss Son   . Diabetes Maternal Grandmother   . Parkinson's disease Maternal Grandmother   . Macular degeneration Maternal Grandmother   . Thyroid cancer Maternal Grandmother   . Stomach cancer Maternal Grandmother   . Cervical  cancer Maternal Grandmother   . Migraines Maternal Grandmother   . Suicidality Maternal Grandfather   . Allergic rhinitis Neg Hx   . Eczema Neg Hx   . Urticaria Neg Hx     Social History   Socioeconomic History  . Marital status: Married    Spouse name: Not on file  . Number of children: Not on file  . Years of education: Not on file  . Highest education level: Not on file  Occupational History  . Not on file  Tobacco Use  . Smoking status: Former Smoker    Types: Cigarettes    Quit date: 11/27/2012    Years since quitting: 7.5  . Smokeless tobacco: Never Used  Vaping Use  . Vaping Use: Never used  Substance and Sexual Activity  . Alcohol use: Not Currently  . Drug use: Yes    Types: Marijuana  . Sexual activity: Yes    Birth control/protection: Other-see comments  Other Topics Concern  . Not on file  Social History Narrative  . Not on file   Social Determinants of Health   Financial Resource Strain: Not on file  Food Insecurity: Not on file  Transportation Needs: Not on file  Physical Activity: Not on file  Stress: Not on file  Social Connections: Not on file  Intimate Partner Violence: Not on file    Outpatient Medications Prior to Visit  Medication Sig Dispense Refill  . topiramate (TOPAMAX) 25 MG tablet TAKE 3 TABLETS BY MOUTH AT BEDTIME (Patient not taking: Reported on 06/16/2020) 90 tablet 2  . albuterol (VENTOLIN HFA) 108 (90 Base) MCG/ACT inhaler Inhale 2 puffs into the lungs every 6 (six) hours as needed. (Patient not taking: Reported on 06/16/2020) 18 g 4  . buPROPion (WELLBUTRIN SR) 150 MG 12 hr tablet Take 1 tablet (150 mg total) by mouth 2 (two) times daily. (Patient not taking: Reported on 06/16/2020) 60 tablet 2  . clobetasol ointment (TEMOVATE) 9.89 % Apply 1 application topically 2 (two) times daily. (Patient not taking: Reported on 06/16/2020) 60 g 2  . clotrimazole-betamethasone (LOTRISONE) cream Apply 1 application topically 2 (two) times daily.  (Patient not taking: Reported on 06/16/2020) 45 g 2  . ELDERBERRY PO Take by mouth. (Patient not taking: Reported on 06/16/2020)    . EPINEPHrine 0.3 mg/0.3 mL IJ SOAJ injection Inject 0.3 mg into the muscle as needed. (Patient not taking: Reported on 06/16/2020)    . ergocalciferol (VITAMIN D2) 1.25 MG (50000 UT) capsule Take 50,000 Units by mouth once a week. (Patient not taking: Reported on 06/16/2020)    . FLUoxetine (PROZAC) 20 MG capsule Take 1 capsule by mouth once daily (Patient not taking: Reported on 06/16/2020) 30 capsule 0  . Multiple Vitamins-Minerals (AIRBORNE PO) Take by mouth. (Patient not taking: Reported on 06/16/2020)    . Multiple Vitamins-Minerals (WOMENS MULTI GUMMIES) CHEW Chew by mouth. (Patient not taking: Reported on 06/16/2020)    . naratriptan (AMERGE) 2.5 MG tablet Take 1 tablet (2.5 mg total) by mouth as needed for migraine. Take one (1) tablet at onset of headache; if returns or does not resolve, may repeat after 4 hours; do not exceed five (5) mg in 24 hours. (Patient not taking: Reported on 06/16/2020) 10 tablet 1  . OVER THE COUNTER MEDICATION Power Zinc gummy (Patient not taking: Reported on 06/16/2020)    . OVER THE COUNTER MEDICATION Power C gummy (Patient not taking: Reported on 06/16/2020)    . pantoprazole (PROTONIX) 40 MG tablet Take 1 tablet (40 mg total) by mouth daily. (Patient not taking: Reported on 06/16/2020) 90 tablet 3  . propranolol (INDERAL) 40 MG tablet Take 1 tablet (40 mg total) by mouth daily. (Patient not taking: Reported on 06/16/2020) 30 tablet 2   No facility-administered medications prior to visit.    Allergies  Allergen Reactions  . Codeine Anaphylaxis    Other reaction(s): Projectile vomiting, throat closes  . Morphine Anaphylaxis    Other reaction(s): Projectile vomiting, throat clos  . Other Anaphylaxis    peanuts  . Fioricet [Butalbital-Apap-Caffeine] Other (See Comments)    Headache  . Nsaids Other (See Comments)    Not allowed d/t  gastric bypass.  . Shellfish Allergy Swelling  . Vancomycin Itching and Other (See Comments)    Itching & flushed skin.  . Venlafaxine Other (See Comments)    GI upset  . Zolpidem Other (See Comments)    Parasomnia  . Latex Rash    Review of Systems As per HPI.     Objective:    Physical Exam Vitals and nursing note reviewed.  Constitutional:      General: She is not in acute distress.  Appearance: Normal appearance. She is not ill-appearing, toxic-appearing or diaphoretic.  Cardiovascular:     Rate and Rhythm: Normal rate and regular rhythm.  Pulmonary:     Effort: Pulmonary effort is normal. No respiratory distress.  Abdominal:     Tenderness: There is no right CVA tenderness or left CVA tenderness.  Skin:    General: Skin is warm and dry.  Neurological:     General: No focal deficit present.     Mental Status: She is alert and oriented to person, place, and time.  Psychiatric:        Mood and Affect: Mood normal.        Behavior: Behavior normal.     BP 103/68   Pulse 68   Temp (!) 97.5 F (36.4 C)   Ht 5\' 3"  (1.6 m)   Wt 211 lb 12.8 oz (96.1 kg)   LMP 11/04/2018   SpO2 97%   BMI 37.52 kg/m  Wt Readings from Last 3 Encounters:  06/16/20 211 lb 12.8 oz (96.1 kg)  04/17/20 208 lb (94.3 kg)  03/28/20 214 lb 6.4 oz (97.3 kg)    Health Maintenance Due  Topic Date Due  . MAMMOGRAM  Never done    There are no preventive care reminders to display for this patient.   Lab Results  Component Value Date   TSH 2.640 11/28/2019   Lab Results  Component Value Date   WBC 7.6 03/28/2020   HGB 15.1 03/28/2020   HCT 44.7 03/28/2020   MCV 86 03/28/2020   PLT 243 03/28/2020   Lab Results  Component Value Date   NA 139 03/28/2020   K 4.0 03/28/2020   CO2 20 03/28/2020   GLUCOSE 79 03/28/2020   BUN 7 03/28/2020   CREATININE 0.66 03/28/2020   BILITOT 0.7 03/28/2020   ALKPHOS 90 03/28/2020   AST 15 03/28/2020   ALT 11 03/28/2020   PROT 6.6 03/28/2020    ALBUMIN 4.0 03/28/2020   CALCIUM 9.1 03/28/2020   Lab Results  Component Value Date   CHOL 142 11/28/2019   Lab Results  Component Value Date   HDL 64 11/28/2019   Lab Results  Component Value Date   LDLCALC 64 11/28/2019   Lab Results  Component Value Date   TRIG 68 11/28/2019   Lab Results  Component Value Date   CHOLHDL 2.2 11/28/2019   No results found for: HGBA1C     Assessment & Plan:   Alvine was seen today for std testing.  Diagnoses and all orders for this visit:  Screening for STD (sexually transmitted disease) Wet prep + clue cells. Will treat for BV. Urine cytology pending. Labs pending as below. Handout given.  -     WET PREP FOR TRICH, YEAST, CLUE -     Urine cytology ancillary only -     STD Screen (8)  High risk heterosexual behavior Labs pending as below.  -     Urine cytology ancillary only -     STD Screen (8)  Dysuria UA negative.  -     Urinalysis, Complete  Bacterial vaginosis + clue cells on wet prep. Flagyl as below. Do not drink alcohol while taking flagyl.  -     metroNIDAZOLE (FLAGYL) 500 MG tablet; Take 1 tablet (500 mg total) by mouth 2 (two) times daily for 7 days.   Return if symptoms worsen or fail to improve.  The patient indicates understanding of these issues and agrees with the plan.  Gwenlyn Perking, FNP

## 2020-06-19 LAB — MOLECULAR ANCILLARY ONLY
Bacterial Vaginitis-Urine: NEGATIVE
Candida Urine: NEGATIVE
Chlamydia: NEGATIVE
Comment: NEGATIVE
Comment: NEGATIVE
Comment: NORMAL
Neisseria Gonorrhea: NEGATIVE
Trichomonas: NEGATIVE

## 2020-06-19 LAB — STD SCREEN (8)
HIV Screen 4th Generation wRfx: NONREACTIVE
HSV 1 Glycoprotein G Ab, IgG: 59.3 index — ABNORMAL HIGH (ref 0.00–0.90)
HSV 2 IgG, Type Spec: <0.91 index (ref 0.00–0.90)
Hep A IgM: NEGATIVE
Hep B C IgM: NEGATIVE
Hep C Virus Ab: <0.1 s/co ratio (ref 0.0–0.9)
Hepatitis B Surface Ag: NEGATIVE
RPR Ser Ql: NONREACTIVE

## 2020-06-23 ENCOUNTER — Other Ambulatory Visit: Payer: Self-pay | Admitting: Family Medicine

## 2020-06-23 DIAGNOSIS — B009 Herpesviral infection, unspecified: Secondary | ICD-10-CM

## 2020-06-23 MED ORDER — VALACYCLOVIR HCL 1 G PO TABS: ORAL_TABLET | ORAL | 0 refills | Status: DC

## 2020-07-03 ENCOUNTER — Encounter: Payer: Self-pay | Admitting: Family Medicine

## 2020-07-03 ENCOUNTER — Ambulatory Visit (INDEPENDENT_AMBULATORY_CARE_PROVIDER_SITE_OTHER): Payer: Medicare Other

## 2020-07-03 ENCOUNTER — Other Ambulatory Visit: Payer: Self-pay

## 2020-07-03 ENCOUNTER — Ambulatory Visit (INDEPENDENT_AMBULATORY_CARE_PROVIDER_SITE_OTHER): Payer: Medicare Other | Admitting: Family Medicine

## 2020-07-03 VITALS — BP 103/72 | HR 98 | Temp 97.9°F | Ht 63.0 in | Wt 209.5 lb

## 2020-07-03 DIAGNOSIS — W19XXXA Unspecified fall, initial encounter: Secondary | ICD-10-CM

## 2020-07-03 DIAGNOSIS — M5441 Lumbago with sciatica, right side: Secondary | ICD-10-CM

## 2020-07-03 DIAGNOSIS — M25551 Pain in right hip: Secondary | ICD-10-CM

## 2020-07-03 DIAGNOSIS — M546 Pain in thoracic spine: Secondary | ICD-10-CM

## 2020-07-03 MED ORDER — DICLOFENAC SODIUM 75 MG PO TBEC: 75.0000 mg | DELAYED_RELEASE_TABLET | Freq: Two times a day (BID) | ORAL | 0 refills | Status: DC

## 2020-07-03 MED ORDER — METHYLPREDNISOLONE ACETATE 40 MG/ML IJ SUSP
80.0000 mg | Freq: Once | INTRAMUSCULAR | Status: AC
  Administered 2020-07-03: 80 mg via INTRAMUSCULAR

## 2020-07-03 MED ORDER — PREDNISONE 20 MG PO TABS: 20.0000 mg | ORAL_TABLET | Freq: Every day | ORAL | 0 refills | Status: AC

## 2020-07-03 MED ORDER — METHYLPREDNISOLONE ACETATE 80 MG/ML IJ SUSP: 80.0000 mg | Freq: Once | INTRAMUSCULAR | Status: DC

## 2020-07-03 NOTE — Addendum Note (Signed)
Addended by: Milas Hock on: 07/03/2020 10:54 AM   Modules accepted: Orders

## 2020-07-03 NOTE — Progress Notes (Signed)
Acute Office Visit  Subjective:    Patient ID: Stephanie Dyer, female    DOB: 1975-07-05, 45 y.o.   MRN: [id number]  Chief Complaint  Patient presents with  . Fall    HPI Patient is in today for a fall that happened 5 days ago while she was on vacation in [place]. She was running when she slipped and fell on a large rock on her left side. Since she has had mid left sided back pain. She has also been having pain in her right posterior-lateral side. The pain in her hip just started this morning. The pain radiates now to her right ankle with some numbness and tingling in her right leg. She reports her pain is 0/10 at rest in her hip and a 3-4/10 in her back with rest. With movement her pain is a 7-8/10 in both locations. She did run yesterday, less an 1 mile. She is typically a daily runner. She denies fever, changes in in bowel or bladder control. Denies dizziness or changes in her vision. She has taken a muscle relaxer and tylenol with little improvement. She reports that she can take diclofenac.   Past Medical History:  Diagnosis Date  . Angio-edema   . Ankylosing spondylitis ([place])   . Anxiety   . Arthritis   . Asthma   . Cervical cancer ([place]) 1998  . Elevated LFTs   . Environmental allergies   . GERD (gastroesophageal reflux disease)   . History of gastric bypass   . History of iron deficiency anemia   . Hypoglycemia   . Kidney stones   . Lumbar back pain   . Migraines   . PCOS (polycystic ovarian syndrome)   . Pseudotumor cerebri 2006  . Thyroid cyst   . Urticaria   . Vitamin B12 deficiency 03/31/2020  . Vitamin D deficiency     Past Surgical History:  Procedure Laterality Date  . ABDOMINAL HYSTERECTOMY  10/2018  . ADENOIDECTOMY    . CESAREAN SECTION  2016  . CHOLECYSTECTOMY  1998  . COSMETIC SURGERY  2015  . GASTRIC BYPASS  2014  . KNEE SURGERY Left 2001  . LITHOTRIPSY  2021  . TONSILLECTOMY      Family History  Problem Relation Age of Onset  . Alcohol abuse  Mother   . Diabetes Mother   . COPD Mother   . Emphysema Mother   . Asthma Mother   . Heart disease Father   . Heart attack Father   . CVA Brother   . Diabetes Brother   . Hypertension Brother   . Autism Son   . Hearing loss Son   . Diabetes Maternal Grandmother   . Parkinson's disease Maternal Grandmother   . Macular degeneration Maternal Grandmother   . Thyroid cancer Maternal Grandmother   . Stomach cancer Maternal Grandmother   . Cervical cancer Maternal Grandmother   . Migraines Maternal Grandmother   . Suicidality Maternal Grandfather   . Allergic rhinitis Neg Hx   . Eczema Neg Hx   . Urticaria Neg Hx     Social History   Socioeconomic History  . Marital status: Married    Spouse name: Not on file  . Number of children: Not on file  . Years of education: Not on file  . Highest education level: Not on file  Occupational History  . Not on file  Tobacco Use  . Smoking status: Former Smoker    Types: Cigarettes    Quit date: 11/27/2012  Years since quitting: 7.6  . Smokeless tobacco: Never Used  Vaping Use  . Vaping Use: Never used  Substance and Sexual Activity  . Alcohol use: Not Currently  . Drug use: Yes    Types: Marijuana  . Sexual activity: Yes    Birth control/protection: Other-see comments  Other Topics Concern  . Not on file  Social History Narrative  . Not on file   Social Determinants of Health   Financial Resource Strain: Not on file  Food Insecurity: Not on file  Transportation Needs: Not on file  Physical Activity: Not on file  Stress: Not on file  Social Connections: Not on file  Intimate Partner Violence: Not on file    Outpatient Medications Prior to Visit  Medication Sig Dispense Refill  . albuterol (VENTOLIN HFA) 108 (90 Base) MCG/ACT inhaler Inhale 2 puffs into the lungs every 6 (six) hours as needed. 18 g 4  . ELDERBERRY PO Take by mouth.    . EPINEPHrine 0.3 mg/0.3 mL IJ SOAJ injection Inject 0.3 mg into the muscle as  needed.    . ergocalciferol (VITAMIN D2) 1.25 MG (50000 UT) capsule Take 50,000 Units by mouth once a week.    . Multiple Vitamins-Minerals (AIRBORNE PO) Take by mouth.    . Multiple Vitamins-Minerals (WOMENS MULTI GUMMIES) CHEW Chew by mouth.    Marland Kitchen OVER THE COUNTER MEDICATION Power Zinc gummy    . OVER THE COUNTER MEDICATION Power C gummy    . pantoprazole (PROTONIX) 40 MG tablet Take 1 tablet (40 mg total) by mouth daily. 90 tablet 3  . valACYclovir (VALTREX) 1000 MG tablet 2 PO BID x  1 day at onset of fever blister 12 tablet 0  . buPROPion (WELLBUTRIN SR) 150 MG 12 hr tablet Take 1 tablet (150 mg total) by mouth 2 (two) times daily. (Patient not taking: Reported on 06/16/2020) 60 tablet 2  . clobetasol ointment (TEMOVATE) 4.25 % Apply 1 application topically 2 (two) times daily. (Patient not taking: Reported on 06/16/2020) 60 g 2  . clotrimazole-betamethasone (LOTRISONE) cream Apply 1 application topically 2 (two) times daily. (Patient not taking: Reported on 06/16/2020) 45 g 2  . FLUoxetine (PROZAC) 20 MG capsule Take 1 capsule by mouth once daily (Patient not taking: Reported on 06/16/2020) 30 capsule 0  . naratriptan (AMERGE) 2.5 MG tablet Take 1 tablet (2.5 mg total) by mouth as needed for migraine. Take one (1) tablet at onset of headache; if returns or does not resolve, may repeat after 4 hours; do not exceed five (5) mg in 24 hours. (Patient not taking: Reported on 06/16/2020) 10 tablet 1  . propranolol (INDERAL) 40 MG tablet Take 1 tablet (40 mg total) by mouth daily. (Patient not taking: Reported on 06/16/2020) 30 tablet 2  . topiramate (TOPAMAX) 25 MG tablet TAKE 3 TABLETS BY MOUTH AT BEDTIME (Patient not taking: Reported on 06/16/2020) 90 tablet 2   No facility-administered medications prior to visit.    Allergies  Allergen Reactions  . Codeine Anaphylaxis    Other reaction(s): Projectile vomiting, throat closes  . Morphine Anaphylaxis    Other reaction(s): Projectile vomiting, throat  clos  . Other Anaphylaxis    peanuts  . Fioricet [Butalbital-Apap-Caffeine] Other (See Comments)    Headache  . Nsaids Other (See Comments)    Not allowed d/t gastric bypass.  . Shellfish Allergy Swelling  . Vancomycin Itching and Other (See Comments)    Itching & flushed skin.  . Venlafaxine Other (See Comments)  GI upset  . Zolpidem Other (See Comments)    Parasomnia  . Latex Rash    Review of Systems As per HPI.     Objective:    Physical Exam Vitals and nursing note reviewed.  Constitutional:      General: She is not in acute distress.    Appearance: She is not ill-appearing, toxic-appearing or diaphoretic.  HENT:     Head: Normocephalic and atraumatic.  Pulmonary:     Effort: Pulmonary effort is normal. No respiratory distress.  Musculoskeletal:     Cervical back: Neck supple. No rigidity.     Thoracic back: Tenderness (left ) and bony tenderness (around T9) present. No swelling. Normal range of motion.     Lumbar back: Tenderness (bilateral paraspinal) and bony tenderness (around L2) present. Positive right straight leg raise test.     Right hip: Tenderness present. No bony tenderness. Normal range of motion. Normal strength.  Neurological:     Mental Status: She is alert and oriented to person, place, and time.     Sensory: No sensory deficit.     Motor: No weakness.     Gait: Gait abnormal (antalgic gait).  Psychiatric:        Mood and Affect: Mood normal.        Behavior: Behavior normal.        Thought Content: Thought content normal.        Judgment: Judgment normal.     BP 103/72   Pulse 98   Temp 97.9 F (36.6 C) (Temporal)   Ht 5\' 3"  (1.6 m)   Wt 209 lb 8 oz (95 kg)   LMP 11/04/2018   BMI 37.11 kg/m  Wt Readings from Last 3 Encounters:  07/03/20 209 lb 8 oz (95 kg)  06/16/20 211 lb 12.8 oz (96.1 kg)  04/17/20 208 lb (94.3 kg)    Health Maintenance Due  Topic Date Due  . COVID-19 Vaccine (1) Never done  . MAMMOGRAM  Never done     There are no preventive care reminders to display for this patient.   Lab Results  Component Value Date   TSH 2.640 11/28/2019   Lab Results  Component Value Date   WBC 7.6 03/28/2020   HGB 15.1 03/28/2020   HCT 44.7 03/28/2020   MCV 86 03/28/2020   PLT 243 03/28/2020   Lab Results  Component Value Date   NA 139 03/28/2020   K 4.0 03/28/2020   CO2 20 03/28/2020   GLUCOSE 79 03/28/2020   BUN 7 03/28/2020   CREATININE 0.66 03/28/2020   BILITOT 0.7 03/28/2020   ALKPHOS 90 03/28/2020   AST 15 03/28/2020   ALT 11 03/28/2020   PROT 6.6 03/28/2020   ALBUMIN 4.0 03/28/2020   CALCIUM 9.1 03/28/2020   Lab Results  Component Value Date   CHOL 142 11/28/2019   Lab Results  Component Value Date   HDL 64 11/28/2019   Lab Results  Component Value Date   LDLCALC 64 11/28/2019   Lab Results  Component Value Date   TRIG 68 11/28/2019   Lab Results  Component Value Date   CHOLHDL 2.2 11/28/2019   No results found for: HGBA1C     Assessment & Plan:   Rayah was seen today for fall.  Diagnoses and all orders for this visit:  Acute right hip pain Discussed likely muscular, xray obtained today to rule out fracture. Diclofenac as below. IM steroid injection today in office. Start prednisone burst tomorrow.  Heat, ice, rest. Handout given.  -     diclofenac (VOLTAREN) 75 MG EC tablet; Take 1 tablet (75 mg total) by mouth 2 (two) times daily. -     predniSONE (DELTASONE) 20 MG tablet; Take 1 tablet (20 mg total) by mouth daily with breakfast for 5 days. Start tomorrow. -     methylPREDNISolone acetate (DEPO-MEDROL) injection 80 mg -     DG HIP UNILAT W OR W/O PELVIS 2-3 VIEWS RIGHT; Future  Acute thoracic back pain, unspecified back pain laterality Likely muscular, xray obtained to rule out fractures. Medications as below. Heat, ice, rest.  -     diclofenac (VOLTAREN) 75 MG EC tablet; Take 1 tablet (75 mg total) by mouth 2 (two) times daily. -     predniSONE  (DELTASONE) 20 MG tablet; Take 1 tablet (20 mg total) by mouth daily with breakfast for 5 days. Start tomorrow. -     methylPREDNISolone acetate (DEPO-MEDROL) injection 80 mg -     DG Thoracic Spine 2 View; Future  Acute midline low back pain with right-sided sciatica Likely muscular, xray obtained to rule out fractures. Medications as below. Heat, ice, rest.  -     diclofenac (VOLTAREN) 75 MG EC tablet; Take 1 tablet (75 mg total) by mouth 2 (two) times daily. -     predniSONE (DELTASONE) 20 MG tablet; Take 1 tablet (20 mg total) by mouth daily with breakfast for 5 days. Start tomorrow. -     methylPREDNISolone acetate (DEPO-MEDROL) injection 80 mg -     DG Lumbar Spine Complete; Future  Fall, initial encounter -     DG Thoracic Spine 2 View; Future -     DG Lumbar Spine Complete; Future -     DG HIP UNILAT W OR W/O PELVIS 2-3 VIEWS RIGHT; Future  Return to office for new or worsening symptoms, or if symptoms persist.   The patient indicates understanding of these issues and agrees with the plan.    Gwenlyn Perking, FNP

## 2020-07-03 NOTE — Patient Instructions (Signed)
Hip Pain The hip is the joint between the upper legs and the lower pelvis. The bones, cartilage, tendons, and muscles of your hip joint support your body and allow you to move around. Hip pain can range from a minor ache to severe pain in one or both of your hips. The pain may be felt on the inside of the hip joint near the groin, or on the outside near the buttocks and upper thigh. You may also have swelling or stiffness in your hip area. Follow these instructions at home: Managing pain, stiffness, and swelling  If directed, put ice on the painful area. To do this: ? Put ice in a plastic bag. ? Place a towel between your skin and the bag. ? Leave the ice on for 20 minutes, 2-3 times a day.  If directed, apply heat to the affected area as often as told by your health care provider. Use the heat source that your health care provider recommends, such as a moist heat pack or a heating pad. ? Place a towel between your skin and the heat source. ? Leave the heat on for 20-30 minutes. ? Remove the heat if your skin turns bright red. This is especially important if you are unable to feel pain, heat, or cold. You may have a greater risk of getting burned.      Activity  Do exercises as told by your health care provider.  Avoid activities that cause pain. General instructions  Take over-the-counter and prescription medicines only as told by your health care provider.  Keep a journal of your symptoms. Write down: ? How often you have hip pain. ? The location of your pain. ? What the pain feels like. ? What makes the pain worse.  Sleep with a pillow between your legs on your most comfortable side.  Keep all follow-up visits as told by your health care provider. This is important.   Contact a health care provider if:  You cannot put weight on your leg.  Your pain or swelling continues or gets worse after one week.  It gets harder to walk.  You have a fever. Get help right away  if:  You fall.  You have a sudden increase in pain and swelling in your hip.  Your hip is red or swollen or very tender to touch. Summary  Hip pain can range from a minor ache to severe pain in one or both of your hips.  The pain may be felt on the inside of the hip joint near the groin, or on the outside near the buttocks and upper thigh.  Avoid activities that cause pain.  Write down how often you have hip pain, the location of the pain, what makes it worse, and what it feels like. This information is not intended to replace advice given to you by your health care provider. Make sure you discuss any questions you have with your health care provider. Document Revised: 07/10/2018 Document Reviewed: 07/10/2018 Elsevier Patient Education  2021 Elsevier Inc.  

## 2020-07-04 ENCOUNTER — Other Ambulatory Visit: Payer: Self-pay | Admitting: Family Medicine

## 2020-07-04 ENCOUNTER — Telehealth: Payer: Self-pay

## 2020-07-04 ENCOUNTER — Ambulatory Visit (HOSPITAL_COMMUNITY)
Admission: RE | Admit: 2020-07-04 | Discharge: 2020-07-04 | Disposition: A | Discharge status: Home / Self Care | Payer: Medicare Other | Source: Ambulatory Visit | Attending: Family Medicine | Admitting: Family Medicine

## 2020-07-04 DIAGNOSIS — W19XXXA Unspecified fall, initial encounter: Secondary | ICD-10-CM | POA: Diagnosis not present

## 2020-07-04 DIAGNOSIS — M546 Pain in thoracic spine: Secondary | ICD-10-CM

## 2020-07-04 DIAGNOSIS — S32009A Unspecified fracture of unspecified lumbar vertebra, initial encounter for closed fracture: Secondary | ICD-10-CM

## 2020-07-04 DIAGNOSIS — Y939 Activity, unspecified: Secondary | ICD-10-CM | POA: Insufficient documentation

## 2020-07-04 DIAGNOSIS — Y929 Unspecified place or not applicable: Secondary | ICD-10-CM | POA: Insufficient documentation

## 2020-07-04 DIAGNOSIS — S22000S Wedge compression fracture of unspecified thoracic vertebra, sequela: Secondary | ICD-10-CM

## 2020-07-04 DIAGNOSIS — M4854XA Collapsed vertebra, not elsewhere classified, thoracic region, initial encounter for fracture: Secondary | ICD-10-CM | POA: Diagnosis not present

## 2020-07-04 DIAGNOSIS — N200 Calculus of kidney: Secondary | ICD-10-CM | POA: Insufficient documentation

## 2020-07-04 MED ORDER — HYDROCODONE-ACETAMINOPHEN 10-325 MG PO TABS: 1.0000 | ORAL_TABLET | Freq: Three times a day (TID) | ORAL | 0 refills | Status: AC | PRN

## 2020-07-04 NOTE — Telephone Encounter (Signed)
Please call patient with xray results that were performed yesterday.  [phone]

## 2020-07-04 NOTE — Telephone Encounter (Signed)
Patient aware that xray results have not been read by [facility] yet. Patient notified that we will call her once we receive results.

## 2020-07-11 DIAGNOSIS — S32009A Unspecified fracture of unspecified lumbar vertebra, initial encounter for closed fracture: Secondary | ICD-10-CM | POA: Insufficient documentation

## 2020-07-15 ENCOUNTER — Encounter: Payer: Self-pay | Admitting: Family Medicine

## 2020-07-15 ENCOUNTER — Ambulatory Visit (INDEPENDENT_AMBULATORY_CARE_PROVIDER_SITE_OTHER): Payer: Medicare Other | Admitting: Family Medicine

## 2020-07-15 DIAGNOSIS — J019 Acute sinusitis, unspecified: Secondary | ICD-10-CM

## 2020-07-15 MED ORDER — FLUCONAZOLE 150 MG PO TABS: 150.0000 mg | ORAL_TABLET | Freq: Once | ORAL | 0 refills | Status: AC

## 2020-07-15 MED ORDER — METHYLPREDNISOLONE 4 MG PO TBPK: ORAL_TABLET | ORAL | 0 refills | Status: DC

## 2020-07-15 MED ORDER — AMOXICILLIN-POT CLAVULANATE 875-125 MG PO TABS: 1.0000 | ORAL_TABLET | Freq: Two times a day (BID) | ORAL | 0 refills | Status: AC

## 2020-07-15 NOTE — Progress Notes (Signed)
Virtual Visit via Telephone Note  I connected with Stephanie Dyer on 07/15/20 at 9:22 AM by telephone and verified that I am speaking with the correct person using two identifiers. Stephanie Dyer is currently located at work and nobody is currently with her during this visit. The provider, Loman Brooklyn, FNP is located in their office at time of visit.  I discussed the limitations, risks, security and privacy concerns of performing an evaluation and management service by telephone and the availability of in person appointments. I also discussed with the patient that there may be a patient responsible charge related to this service. The patient expressed understanding and agreed to proceed.  Subjective: PCP: Loman Brooklyn, FNP  Chief Complaint  Patient presents with  . URI   Patient complains of cough, head congestion, sneezing, sore throat, ear pain/pressure, facial pain/pressure, fever and postnasal drainage. Onset of symptoms was 1 day ago, gradually worsening since that time. She is drinking plenty of fluids. Evaluation to date: at home COVID test negative x2. Treatment to date: Tylenol and Albuterol inhaler. She has a history of asthma. She does not smoke.    ROS: Per HPI  Current Outpatient Medications:  .  albuterol (VENTOLIN HFA) 108 (90 Base) MCG/ACT inhaler, Inhale 2 puffs into the lungs every 6 (six) hours as needed., Disp: 18 g, Rfl: 4 .  diclofenac (VOLTAREN) 75 MG EC tablet, Take 1 tablet (75 mg total) by mouth 2 (two) times daily., Disp: 30 tablet, Rfl: 0 .  ELDERBERRY PO, Take by mouth., Disp: , Rfl:  .  EPINEPHrine 0.3 mg/0.3 mL IJ SOAJ injection, Inject 0.3 mg into the muscle as needed., Disp: , Rfl:  .  ergocalciferol (VITAMIN D2) 1.25 MG (50000 UT) capsule, Take 50,000 Units by mouth once a week., Disp: , Rfl:  .  Multiple Vitamins-Minerals (AIRBORNE PO), Take by mouth., Disp: , Rfl:  .  Multiple Vitamins-Minerals (WOMENS MULTI GUMMIES) CHEW, Chew by mouth., Disp: ,  Rfl:  .  OVER THE COUNTER MEDICATION, Power Zinc gummy, Disp: , Rfl:  .  OVER THE COUNTER MEDICATION, Power C gummy, Disp: , Rfl:  .  pantoprazole (PROTONIX) 40 MG tablet, Take 1 tablet (40 mg total) by mouth daily., Disp: 90 tablet, Rfl: 3 .  valACYclovir (VALTREX) 1000 MG tablet, 2 PO BID x  1 day at onset of fever blister, Disp: 12 tablet, Rfl: 0  Allergies  Allergen Reactions  . Codeine Anaphylaxis    Other reaction(s): Projectile vomiting, throat closes  . Morphine Anaphylaxis    Other reaction(s): Projectile vomiting, throat clos  . Other Anaphylaxis    peanuts  . Fioricet [Butalbital-Apap-Caffeine] Other (See Comments)    Headache  . Nsaids Other (See Comments)    Not allowed d/t gastric bypass.  . Shellfish Allergy Swelling  . Vancomycin Itching and Other (See Comments)    Itching & flushed skin.  . Venlafaxine Other (See Comments)    GI upset  . Zolpidem Other (See Comments)    Parasomnia  . Latex Rash   Past Medical History:  Diagnosis Date  . Angio-edema   . Ankylosing spondylitis ([place])   . Anxiety   . Arthritis   . Asthma   . Cervical cancer ([place]) 1998  . Elevated LFTs   . Environmental allergies   . GERD (gastroesophageal reflux disease)   . History of gastric bypass   . History of iron deficiency anemia   . Hypoglycemia   . Kidney stones   .  Lumbar back pain   . Migraines   . PCOS (polycystic ovarian syndrome)   . Pseudotumor cerebri 2006  . Thyroid cyst   . Urticaria   . Vitamin B12 deficiency 03/31/2020  . Vitamin D deficiency     Observations/Objective: A&O  No respiratory distress or wheezing audible over the phone Mood, judgement, and thought processes all WNL  Assessment and Plan: 1. Acute non-recurrent sinusitis, unspecified location Discussed symptom management.  - amoxicillin-clavulanate (AUGMENTIN) 875-125 MG tablet; Take 1 tablet by mouth 2 (two) times daily for 7 days.  Dispense: 14 tablet; Refill: 0 - methylPREDNISolone (MEDROL  DOSEPAK) 4 MG TBPK tablet; Use as directed.  Dispense: 21 each; Refill: 0   Follow Up Instructions:  I discussed the assessment and treatment plan with the patient. The patient was provided an opportunity to ask questions and all were answered. The patient agreed with the plan and demonstrated an understanding of the instructions.   The patient was advised to call back or seek an in-person evaluation if the symptoms worsen or if the condition fails to improve as anticipated.  The above assessment and management plan was discussed with the patient. The patient verbalized understanding of and has agreed to the management plan. Patient is aware to call the clinic if symptoms persist or worsen. Patient is aware when to return to the clinic for a follow-up visit. Patient educated on when it is appropriate to go to the emergency department.   Time call ended: 9:28 AM  I provided 6 minutes of non-face-to-face time during this encounter.  Hendricks Limes, MSN, APRN, FNP-C [facility] 07/15/20

## 2020-07-16 ENCOUNTER — Ambulatory Visit: Payer: Medicare Other | Admitting: Family Medicine
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
## 2020-07-22 ENCOUNTER — Ambulatory Visit: Payer: Medicare Other | Admitting: Allergy

## 2020-07-22 DIAGNOSIS — Z91038 Other insect allergy status: Secondary | ICD-10-CM

## 2020-07-22 DIAGNOSIS — T781XXD Other adverse food reactions, not elsewhere classified, subsequent encounter: Secondary | ICD-10-CM

## 2020-07-22 DIAGNOSIS — Z889 Allergy status to unspecified drugs, medicaments and biological substances status: Secondary | ICD-10-CM

## 2020-07-22 DIAGNOSIS — Z8709 Personal history of other diseases of the respiratory system: Secondary | ICD-10-CM

## 2020-07-22 DIAGNOSIS — J3089 Other allergic rhinitis: Secondary | ICD-10-CM

## 2020-07-23 ENCOUNTER — Encounter: Payer: Self-pay | Admitting: Family Medicine

## 2020-07-23 DIAGNOSIS — F603 Borderline personality disorder: Secondary | ICD-10-CM

## 2020-07-28 ENCOUNTER — Encounter: Payer: Self-pay | Admitting: Family Medicine

## 2020-07-29 ENCOUNTER — Other Ambulatory Visit: Payer: Self-pay

## 2020-07-29 ENCOUNTER — Ambulatory Visit (INDEPENDENT_AMBULATORY_CARE_PROVIDER_SITE_OTHER): Payer: Medicare Other | Admitting: Family Medicine

## 2020-07-29 ENCOUNTER — Other Ambulatory Visit (HOSPITAL_COMMUNITY)
Admission: RE | Admit: 2020-07-29 | Discharge: 2020-07-29 | Disposition: A | Discharge status: Home / Self Care | Payer: Medicare Other | Source: Ambulatory Visit | Attending: Family Medicine | Admitting: Family Medicine

## 2020-07-29 ENCOUNTER — Encounter: Payer: Self-pay | Admitting: Family Medicine

## 2020-07-29 VITALS — BP 97/68 | HR 83 | Temp 98.2°F | Ht 63.0 in | Wt 206.8 lb

## 2020-07-29 DIAGNOSIS — Z113 Encounter for screening for infections with a predominantly sexual mode of transmission: Secondary | ICD-10-CM | POA: Insufficient documentation

## 2020-07-29 DIAGNOSIS — D229 Melanocytic nevi, unspecified: Secondary | ICD-10-CM

## 2020-07-29 DIAGNOSIS — J029 Acute pharyngitis, unspecified: Secondary | ICD-10-CM | POA: Diagnosis not present

## 2020-07-29 NOTE — Progress Notes (Signed)
Assessment & Plan:  1. Sore throat Continue Claritin and Flonase daily. - EPSTEIN-BARR VIRUS (EBV) Antibody Profile  2. Change in skin mole - Ambulatory referral to Dermatology  3. Routine screening for STI (sexually transmitted infection) - Urine cytology ancillary only   Follow up plan: Return if symptoms worsen or fail to improve.  Hendricks Limes, MSN, APRN, FNP-C [facility]  Subjective:   Patient ID: Stephanie Dyer, female    DOB: [birth year], 45 y.o.   MRN: [id number]  HPI: [name] is a 45 y.o. female presenting on 07/29/2020 for Follow-up [facility] follow up- 07/21/20 pharyngitis . Patient states that her sore throat has not gotten any better. ) and Nevus (Right hand x 5 years and has grew in size and darker in color)  Patient reports he has had a sore throat x2 weeks.  She was seen at urgent care 8 days ago at which time she was advised to start Claritin and Flonase, which she has been doing.  She was given some prednisone as well.  Prior to that she was on Augmentin for sinusitis.  She reports her other symptoms have resolved besides her left ear pain and swollen lymph nodes.  Her throat is worse first thing in the morning.  Patient reports a mole on her right hand that has been present for at least 5 years.  It has been getting larger and darker.  She has not seen a dermatologist.  Patient is requesting STI testing.   ROS: Negative unless specifically indicated above in HPI.   Relevant past medical history reviewed and updated as indicated.   Allergies and medications reviewed and updated.   Current Outpatient Medications:  .  albuterol (VENTOLIN HFA) 108 (90 Base) MCG/ACT inhaler, Inhale 2 puffs into the lungs every 6 (six) hours as needed., Disp: 18 g, Rfl: 4 .  diclofenac (VOLTAREN) 75 MG EC tablet, Take 1 tablet (75 mg total) by mouth 2 (two) times daily., Disp: 30 tablet, Rfl: 0 .  ELDERBERRY PO, Take by mouth., Disp: , Rfl:  .   EPINEPHrine 0.3 mg/0.3 mL IJ SOAJ injection, Inject 0.3 mg into the muscle as needed., Disp: , Rfl:  .  ergocalciferol (VITAMIN D2) 1.25 MG (50000 UT) capsule, Take 50,000 Units by mouth once a week., Disp: , Rfl:  .  loratadine (CLARITIN) 10 MG tablet, Take 1 tablet by mouth daily., Disp: , Rfl:  .  pantoprazole (PROTONIX) 40 MG tablet, Take 1 tablet (40 mg total) by mouth daily., Disp: 90 tablet, Rfl: 3 .  valACYclovir (VALTREX) 1000 MG tablet, 2 PO BID x  1 day at onset of fever blister, Disp: 12 tablet, Rfl: 0  Allergies  Allergen Reactions  . Codeine Anaphylaxis    Other reaction(s): Projectile vomiting, throat closes  . Morphine Anaphylaxis    Other reaction(s): Projectile vomiting, throat clos  . Other Anaphylaxis    peanuts  . Fioricet [Butalbital-Apap-Caffeine] Other (See Comments)    Headache  . Nsaids Other (See Comments)    Not allowed d/t gastric bypass.  . Shellfish Allergy Swelling  . Vancomycin Itching and Other (See Comments)    Itching & flushed skin.  . Venlafaxine Other (See Comments)    GI upset  . Zolpidem Other (See Comments)    Parasomnia  . Latex Rash    Objective:   BP 97/68   Pulse 83   Temp 98.2 F (36.8 C) (Temporal)   Ht 5\' 3"  (1.6 m)  Wt 206 lb 12.8 oz (93.8 kg)   LMP 11/04/2018   SpO2 95%   BMI 36.63 kg/m    Physical Exam Vitals reviewed.  Constitutional:      General: She is not in acute distress.    Appearance: Normal appearance. She is morbidly obese. She is not ill-appearing, toxic-appearing or diaphoretic.  HENT:     Head: Normocephalic and atraumatic.     Right Ear: Tympanic membrane, ear canal and external ear normal. There is no impacted cerumen.     Left Ear: Tympanic membrane, ear canal and external ear normal. There is no impacted cerumen.     Mouth/Throat:     Pharynx: Posterior oropharyngeal erythema (left side) present. No pharyngeal swelling or oropharyngeal exudate.  Eyes:     General: No scleral icterus.        Right eye: No discharge.        Left eye: No discharge.     Conjunctiva/sclera: Conjunctivae normal.  Cardiovascular:     Rate and Rhythm: Normal rate.  Pulmonary:     Effort: Pulmonary effort is normal. No respiratory distress.  Abdominal:     Palpations: There is no splenomegaly.  Musculoskeletal:        General: Normal range of motion.     Cervical back: Normal range of motion.  Lymphadenopathy:     Cervical: No cervical adenopathy.  Skin:    General: Skin is warm and dry.     Capillary Refill: Capillary refill takes less than 2 seconds.  Neurological:     General: No focal deficit present.     Mental Status: She is alert and oriented to person, place, and time. Mental status is at baseline.  Psychiatric:        Mood and Affect: Mood normal.        Behavior: Behavior normal.        Thought Content: Thought content normal.        Judgment: Judgment normal.

## 2020-07-30 ENCOUNTER — Ambulatory Visit
Admission: RE | Admit: 2020-07-30 | Discharge: 2020-07-30 | Disposition: A | Discharge status: Home / Self Care | Payer: Medicare Other | Source: Ambulatory Visit | Attending: Family Medicine | Admitting: Family Medicine

## 2020-07-30 ENCOUNTER — Other Ambulatory Visit: Payer: Self-pay | Admitting: Family Medicine

## 2020-07-30 DIAGNOSIS — Z1231 Encounter for screening mammogram for malignant neoplasm of breast: Secondary | ICD-10-CM

## 2020-07-30 LAB — URINE CYTOLOGY ANCILLARY ONLY
Chlamydia: NEGATIVE
Comment: NEGATIVE
Comment: NEGATIVE
Comment: NORMAL
Neisseria Gonorrhea: NEGATIVE
Trichomonas: NEGATIVE

## 2020-07-31 ENCOUNTER — Encounter: Payer: Self-pay | Admitting: Family Medicine

## 2020-07-31 LAB — EPSTEIN-BARR VIRUS (EBV) ANTIBODY PROFILE
EBV NA IgG: 310 U/mL — ABNORMAL HIGH (ref 0.0–17.9)
EBV VCA IgG: >600 U/mL — ABNORMAL HIGH (ref 0.0–17.9)
EBV VCA IgM: <36 U/mL (ref 0.0–35.9)

## 2020-08-01 ENCOUNTER — Encounter: Payer: Self-pay | Admitting: Family Medicine

## 2020-08-12 ENCOUNTER — Other Ambulatory Visit: Payer: Self-pay

## 2020-08-12 ENCOUNTER — Encounter (HOSPITAL_COMMUNITY): Payer: Self-pay | Admitting: *Deleted

## 2020-08-12 ENCOUNTER — Emergency Department (HOSPITAL_COMMUNITY)
Admission: EM | Admit: 2020-08-12 | Discharge: 2020-08-12 | Disposition: A | Discharge status: Home / Self Care | Payer: Medicare Other | Attending: Emergency Medicine | Admitting: Emergency Medicine

## 2020-08-12 ENCOUNTER — Emergency Department (HOSPITAL_COMMUNITY): Payer: Medicare Other

## 2020-08-12 DIAGNOSIS — J45909 Unspecified asthma, uncomplicated: Secondary | ICD-10-CM | POA: Insufficient documentation

## 2020-08-12 DIAGNOSIS — Z8541 Personal history of malignant neoplasm of cervix uteri: Secondary | ICD-10-CM | POA: Insufficient documentation

## 2020-08-12 DIAGNOSIS — R1032 Left lower quadrant pain: Secondary | ICD-10-CM | POA: Diagnosis present

## 2020-08-12 DIAGNOSIS — Z91040 Latex allergy status: Secondary | ICD-10-CM | POA: Insufficient documentation

## 2020-08-12 DIAGNOSIS — Z87891 Personal history of nicotine dependence: Secondary | ICD-10-CM | POA: Diagnosis not present

## 2020-08-12 DIAGNOSIS — K8500 Idiopathic acute pancreatitis without necrosis or infection: Secondary | ICD-10-CM | POA: Insufficient documentation

## 2020-08-12 LAB — URINALYSIS, ROUTINE W REFLEX MICROSCOPIC
Bilirubin Urine: NEGATIVE
Glucose, UA: NEGATIVE mg/dL
Hgb urine dipstick: NEGATIVE
Ketones, ur: NEGATIVE mg/dL
Leukocytes,Ua: NEGATIVE
Nitrite: NEGATIVE
Protein, ur: NEGATIVE mg/dL
Specific Gravity, Urine: 1.004 — ABNORMAL LOW (ref 1.005–1.030)
pH: 6 (ref 5.0–8.0)

## 2020-08-12 LAB — COMPREHENSIVE METABOLIC PANEL
ALT: 16 U/L (ref 0–44)
AST: 20 U/L (ref 15–41)
Albumin: 3.1 g/dL — ABNORMAL LOW (ref 3.5–5.0)
Alkaline Phosphatase: 72 U/L (ref 38–126)
Anion gap: 4 — ABNORMAL LOW (ref 5–15)
BUN: 12 mg/dL (ref 6–20)
CO2: 26 mmol/L (ref 22–32)
Calcium: 8.6 mg/dL — ABNORMAL LOW (ref 8.9–10.3)
Chloride: 106 mmol/L (ref 98–111)
Creatinine, Ser: 0.71 mg/dL (ref 0.44–1.00)
GFR, Estimated: >60 mL/min (ref >60)
Glucose, Bld: 89 mg/dL (ref 70–99)
Potassium: 3.8 mmol/L (ref 3.5–5.1)
Sodium: 136 mmol/L (ref 135–145)
Total Bilirubin: 0.8 mg/dL (ref 0.3–1.2)
Total Protein: 6.4 g/dL — ABNORMAL LOW (ref 6.5–8.1)

## 2020-08-12 LAB — CBC
HCT: 41.5 % (ref 36.0–46.0)
Hemoglobin: 13.1 g/dL (ref 12.0–15.0)
MCH: 28.6 pg (ref 26.0–34.0)
MCHC: 31.6 g/dL (ref 30.0–36.0)
MCV: 90.6 fL (ref 80.0–100.0)
Platelets: 228 10*3/uL (ref 150–400)
RBC: 4.58 MIL/uL (ref 3.87–5.11)
RDW: 15.2 % (ref 11.5–15.5)
WBC: 7.7 10*3/uL (ref 4.0–10.5)
nRBC: 0 % (ref 0.0–0.2)

## 2020-08-12 LAB — PREGNANCY, URINE: Preg Test, Ur: NEGATIVE

## 2020-08-12 LAB — LIPASE, BLOOD: Lipase: 68 U/L — ABNORMAL HIGH (ref 11–51)

## 2020-08-12 MED ORDER — HYDROCODONE-ACETAMINOPHEN 5-325 MG PO TABS: 2.0000 | ORAL_TABLET | ORAL | 0 refills | Status: DC | PRN

## 2020-08-12 MED ORDER — FENTANYL CITRATE (PF) 100 MCG/2ML IJ SOLN
50.0000 ug | Freq: Once | INTRAMUSCULAR | Status: AC
  Administered 2020-08-12: 50 ug via INTRAMUSCULAR
  Filled 2020-08-12: qty 2

## 2020-08-12 MED ORDER — ONDANSETRON 8 MG PO TBDP
8.0000 mg | ORAL_TABLET | Freq: Once | ORAL | Status: AC
Start: 2020-08-12 — End: 2020-08-12
  Administered 2020-08-12: 8 mg via ORAL
  Filled 2020-08-12: qty 1

## 2020-08-12 MED ORDER — ONDANSETRON 4 MG PO TBDP: 4.0000 mg | ORAL_TABLET | Freq: Three times a day (TID) | ORAL | 0 refills | Status: DC | PRN

## 2020-08-12 MED ORDER — HYDROCODONE-ACETAMINOPHEN 5-325 MG PO TABS: 1.0000 | ORAL_TABLET | Freq: Four times a day (QID) | ORAL | 0 refills | Status: DC | PRN

## 2020-08-12 MED ORDER — ACETAMINOPHEN 500 MG PO TABS
1000.0000 mg | ORAL_TABLET | Freq: Once | ORAL | Status: AC
  Administered 2020-08-12: 1000 mg via ORAL
  Filled 2020-08-12: qty 2

## 2020-08-12 NOTE — ED Triage Notes (Signed)
Left lower quadrant pain onset today, history of gastric bypass

## 2020-08-12 NOTE — ED Provider Notes (Signed)
[facility] Provider Note   CSN: [id number] Arrival date & time: 08/12/20  1603     History Chief Complaint  Patient presents with  . Abdominal Pain    Stephanie Dyer is a 45 y.o. female.  HPI Patient presents with left lower quadrant abdominal pain, nausea. She is status post gastric bypass, cannot vomit.  However, she notes that over the past day, roughly 12 hours she has had pain focally in the left side of her abdomen.  Sore and severe in character, 8/10, not improved with anything.  She has not been able to tolerate medication. Diarrhea new the onset of illness, but none since. No fever, chills, chest pain, dyspnea.     Past Medical History:  Diagnosis Date  . Angio-edema   . Ankylosing spondylitis ([place])   . Anxiety   . Arthritis   . Asthma   . Cervical cancer ([place]) 1998  . Elevated LFTs   . Environmental allergies   . GERD (gastroesophageal reflux disease)   . History of gastric bypass   . History of iron deficiency anemia   . Hypoglycemia   . Kidney stones   . Lumbar back pain   . Migraines   . PCOS (polycystic ovarian syndrome)   . Pseudotumor cerebri 2006  . Thyroid cyst   . Urticaria   . Vitamin B12 deficiency 03/31/2020  . Vitamin D deficiency     Patient Active Problem List   Diagnosis Date Noted  . Vitamin B12 deficiency 03/31/2020  . Other allergic rhinitis 01/22/2020  . Adverse food reaction 01/22/2020  . History of asthma 01/22/2020  . Multiple drug allergies 01/22/2020  . History of systemic reaction to hymenoptera sting 01/22/2020  . Marijuana use 12/09/2019  . Generalized anxiety disorder 12/06/2019  . Hot flashes 12/06/2019  . Pain in female genitalia on intercourse 12/06/2019  . Ankylosing spondylitis ([place])   . Thyroid cyst   . Asthma   . Environmental allergies   . Migraines   . Vitamin D deficiency   . Elevated LFTs   . Lumbar back pain   . Anxiety   . PCOS (polycystic ovarian syndrome)   . GERD  (gastroesophageal reflux disease)     Past Surgical History:  Procedure Laterality Date  . ADENOIDECTOMY    . CESAREAN SECTION  2016  . CHOLECYSTECTOMY  1998  . COSMETIC SURGERY  2015  . GASTRIC BYPASS  2014  . KNEE SURGERY Left 2001  . LITHOTRIPSY  2021  . TONSILLECTOMY    . TOTAL ABDOMINAL HYSTERECTOMY  10/2018     OB History   No obstetric history on file.     Family History  Problem Relation Age of Onset  . Alcohol abuse Mother   . Diabetes Mother   . COPD Mother   . Emphysema Mother   . Asthma Mother   . Heart disease Father   . Heart attack Father   . CVA Brother   . Diabetes Brother   . Hypertension Brother   . Autism Son   . Hearing loss Son   . Diabetes Maternal Grandmother   . Parkinson's disease Maternal Grandmother   . Macular degeneration Maternal Grandmother   . Thyroid cancer Maternal Grandmother   . Stomach cancer Maternal Grandmother   . Cervical cancer Maternal Grandmother   . Migraines Maternal Grandmother   . Suicidality Maternal Grandfather   . Allergic rhinitis Neg Hx   . Eczema Neg Hx   . Urticaria Neg Hx   .  Breast cancer Neg Hx     Social History   Tobacco Use  . Smoking status: Former Smoker    Types: Cigarettes    Quit date: 11/27/2012    Years since quitting: 7.7  . Smokeless tobacco: Never Used  Vaping Use  . Vaping Use: Never used  Substance Use Topics  . Alcohol use: Not Currently  . Drug use: Yes    Types: Marijuana    Home Medications Prior to Admission medications   Medication Sig Start Date End Date Taking? Authorizing Provider  albuterol (VENTOLIN HFA) 108 (90 Base) MCG/ACT inhaler Inhale 2 puffs into the lungs every 6 (six) hours as needed. 02/19/20  Yes Stacks, Cletus Gash, MD  cetirizine (ZYRTEC ALLERGY) 10 MG tablet Take 10 mg by mouth daily.   Yes [provider]  EPINEPHrine 0.3 mg/0.3 mL IJ SOAJ injection Inject 0.3 mg into the muscle as needed.   Yes [provider]  ferrous sulfate 325 (65  FE) MG EC tablet Take 325 mg by mouth daily.   Yes [provider]  pantoprazole (PROTONIX) 40 MG tablet Take 1 tablet (40 mg total) by mouth daily. 12/04/19  Yes Hawks, Alyse Low A, FNP  valACYclovir (VALTREX) 1000 MG tablet 2 PO BID x  1 day at onset of fever blister 06/23/20  Yes Gwenlyn Perking, FNP  diclofenac (VOLTAREN) 75 MG EC tablet Take 1 tablet (75 mg total) by mouth 2 (two) times daily. Patient not taking: No sig reported 07/03/20   Gwenlyn Perking, FNP  ELDERBERRY PO Take by mouth. Patient not taking: No sig reported    [provider]  ergocalciferol (VITAMIN D2) 1.25 MG (50000 UT) capsule Take 50,000 Units by mouth once a week.    [provider]  loratadine (CLARITIN) 10 MG tablet Take 1 tablet by mouth daily. Patient not taking: No sig reported 07/21/20 07/21/21  [provider]    Allergies    Codeine, Morphine, Other, Fioricet [butalbital-apap-caffeine], Nsaids, Shellfish allergy, Vancomycin, Venlafaxine, Zolpidem, and Latex  Review of Systems   Review of Systems  Constitutional:       Per HPI, otherwise negative  HENT:       Per HPI, otherwise negative  Respiratory:       Per HPI, otherwise negative  Cardiovascular:       Per HPI, otherwise negative  Gastrointestinal: Positive for abdominal pain and nausea.  Endocrine:       Negative aside from HPI  Genitourinary:       Neg aside from HPI   Musculoskeletal:       Per HPI, otherwise negative  Skin: Negative.   Neurological: Negative for syncope.    Physical Exam Updated Vital Signs BP 112/74   Pulse 83   Temp 98.2 F (36.8 C) (Oral)   Resp (!) 23   LMP 11/04/2018   SpO2 98%   Physical Exam Vitals and nursing note reviewed.  Constitutional:      General: She is not in acute distress.    Appearance: She is well-developed.  HENT:     Head: Normocephalic and atraumatic.  Eyes:     Conjunctiva/sclera: Conjunctivae normal.  Cardiovascular:     Rate and Rhythm:  Normal rate and regular rhythm.  Pulmonary:     Effort: Pulmonary effort is normal. No respiratory distress.     Breath sounds: Normal breath sounds. No stridor.  Abdominal:     General: There is no distension.     Tenderness: There is  abdominal tenderness in the left upper quadrant and left lower quadrant.  Skin:    General: Skin is warm and dry.  Neurological:     Mental Status: She is alert and oriented to person, place, and time.     Cranial Nerves: No cranial nerve deficit.     ED Results / Procedures / Treatments   Labs (all labs ordered are listed, but only abnormal results are displayed) Labs Reviewed  URINALYSIS, ROUTINE W REFLEX MICROSCOPIC - Abnormal; Notable for the following components:      Result Value   Color, Urine STRAW (*)    Specific Gravity, Urine 1.004 (*)    All other components within normal limits  PREGNANCY, URINE  LIPASE, BLOOD  COMPREHENSIVE METABOLIC PANEL  CBC    EKG None  Radiology No results found.  Procedures Procedures   Medications Ordered in ED Medications  ondansetron (ZOFRAN-ODT) disintegrating tablet 8 mg (has no administration in time range)  acetaminophen (TYLENOL) tablet 1,000 mg (has no administration in time range)    ED Course  I have reviewed the triage vital signs and the nursing notes.  Pertinent labs & imaging results that were available during my care of the patient were reviewed by me and considered in my medical decision making (see chart for details).   :, Patient continues to complain of pain    10:20 PM Pain substantially better, patient sitting upright, hemodynamically unremarkable.  I reviewed her CT, and I discussed with her.  CT without evidence for acute findings including obstruction, infection. Labs reviewed similarly, generally reassuring. Patient does have mild lipase elevation suggestive mild pancreatitis, which would be consistent with the patient's pain in her left upper abdomen. Now with  central pain resolution patient is comfortable with discharge home with outpatient follow-up with GI, ongoing antiemetics, analgesics. MDM Rules/Calculators/A&P MDM Number of Diagnoses or Management Options Idiopathic acute pancreatitis without infection or necrosis: new, needed workup   Amount and/or Complexity of Data Reviewed Clinical lab tests: ordered and reviewed Tests in the radiology section of CPT: ordered and reviewed Tests in the medicine section of CPT: reviewed and ordered Independent visualization of images, tracings, or specimens: yes  Risk of Complications, Morbidity, and/or Mortality Presenting problems: high Diagnostic procedures: high Management options: high  Critical Care Total time providing critical care: < 30 minutes  Patient Progress Patient progress: improved   Final Clinical Impression(s) / ED Diagnoses Final diagnoses:  Idiopathic acute pancreatitis without infection or necrosis      Carmin Muskrat, MD 08/12/20 2225

## 2020-08-13 MED FILL — Hydrocodone-Acetaminophen Tab 5-325 MG: ORAL | Qty: 6 | Status: AC

## 2020-08-18 ENCOUNTER — Telehealth: Payer: Self-pay | Admitting: Family Medicine

## 2020-08-18 ENCOUNTER — Encounter: Payer: Self-pay | Admitting: Family

## 2020-08-18 ENCOUNTER — Ambulatory Visit (INDEPENDENT_AMBULATORY_CARE_PROVIDER_SITE_OTHER): Payer: Medicare Other | Admitting: Family

## 2020-08-18 ENCOUNTER — Other Ambulatory Visit: Payer: Self-pay

## 2020-08-18 VITALS — BP 98/66 | HR 73 | Temp 97.9°F | Wt 206.0 lb

## 2020-08-18 DIAGNOSIS — I824Y2 Acute embolism and thrombosis of unspecified deep veins of left proximal lower extremity: Secondary | ICD-10-CM | POA: Diagnosis not present

## 2020-08-18 DIAGNOSIS — Z09 Encounter for follow-up examination after completed treatment for conditions other than malignant neoplasm: Secondary | ICD-10-CM

## 2020-08-18 DIAGNOSIS — I2699 Other pulmonary embolism without acute cor pulmonale: Secondary | ICD-10-CM | POA: Diagnosis not present

## 2020-08-18 MED ORDER — RIVAROXABAN (XARELTO) VTE STARTER PACK (15 & 20 MG): ORAL_TABLET | ORAL | 0 refills | Status: DC

## 2020-08-18 MED ORDER — RIVAROXABAN 20 MG PO TABS: 20.0000 mg | ORAL_TABLET | Freq: Every day | ORAL | 1 refills | Status: DC

## 2020-08-18 NOTE — Telephone Encounter (Signed)
[facility] does not have rivaroxaban (XARELTO) 20 MG TABS tablet in stock. Please send in alternative

## 2020-08-18 NOTE — Patient Instructions (Signed)
Pulmonary Embolism  A pulmonary embolism (PE) is a sudden blockage or decrease of blood flow in one or both lungs that happens when a clot travels into the arteries of the lung (pulmonary arteries). Most blockages come from a blood clot that forms in the vein of a leg or arm (deep vein thrombosis, DVT) and travels to the lungs. A clot is blood that has thickened into a gel or solid. PE is a dangerous and life-threatening condition that needs to betreated right away. What are the causes? This condition is usually caused by a blood clot that forms in a vein and moves to the lungs. In rare cases, it may be caused by air, fat, part of a tumor, orother tissue that moves through the veins and into the lungs. What increases the risk? The following factors may make you more likely to develop this condition: Experiencing a traumatic injury, such as breaking a hip or leg. Having: A spinal cord injury. Major surgery, especially hip or knee replacement, or surgery on parts of the nervous system or on the abdomen. A stroke. A blood-clotting disease. Long-term (chronic) lung or heart disease. Cancer, especially if you are being treated with chemotherapy. A central venous catheter. Taking medicines that contain estrogen. These include birth control pills and hormone replacement therapy. Being: Pregnant. In the period of time after your baby is delivered (postpartum). Older than age 56. Overweight. A smoker, especially if you have other risks. Not very active (sedentary), not being able to move at all, or spending long periods sitting, such as travel over 6 hours. You are also at a greater risk if you have a leg in a cast or splint. What are the signs or symptoms? Symptoms of this condition usually start suddenly and include: Shortness of breath during activity or at rest. Coughing, coughing up blood, or coughing up bloody mucus. Chest pain, back pain, or shoulder blade pain that gets worse with deep  breaths. Rapid or irregular heartbeat. Feeling light-headed or dizzy, or fainting. Feeling anxious. Pain and swelling in a leg. This is a symptom of DVT, which can lead to PE. How is this diagnosed? This condition may be diagnosed based on your medical history, a physical exam, and tests. Tests may include: Blood tests. An ECG (electrocardiogram) of the heart. A CT pulmonary angiogram. This test checks blood flow in and around your lungs. A ventilation-perfusion scan, also called a lung VQ scan. This test measures air flow and blood flow to the lungs. An ultrasound to check for a DVT. How is this treated? Treatment for this condition depends on many factors, such as the cause of your PE, your risk for bleeding or developing more clots, and other medical conditions you may have. Treatment aims to stop blood clots from forming or growing larger. In some cases, treatment may be aimed at breaking apart or removing the blood clot. Treatment may include: Medicines, such as: Blood thinning medicines, also called anticoagulants, to stop clots from forming and growing. Medicines that break apart clots (fibrinolytics). Procedures, such as: Using a flexible tube to remove a blood clot (embolectomy) or to deliver medicine to destroy it (catheter-directed thrombolysis). Surgery to remove the clot (surgical embolectomy). This is rare. You may need a combination of immediate, long-term, and extended treatments. Your treatment may continue for several months (maintenance therapy) or longer depending on your medical conditions. You and your health care provider will work together to choose the treatment program that is best foryou. Follow these instructions  at home: Medicines Take over-the-counter and prescription medicines only as told by your health care provider. If you are taking blood thinners: Talk with your health care provider before you take any medicines that contain aspirin or NSAIDs, such as  ibuprofen. These medicines increase your risk for dangerous bleeding. Take your medicine exactly as told, at the same time every day. Avoid activities that could cause injury or bruising, and follow instructions about how to prevent falls. Wear a medical alert bracelet or carry a card that lists what medicines you take. Understand what foods and drugs interact with any medicines that you are taking. General instructions Ask your health care provider when you may return to your normal activities. Avoid sitting or lying for a long time without moving. Maintain a healthy weight. Ask your health care provider what weight is healthy for you. Do not use any products that contain nicotine or tobacco. These products include cigarettes, chewing tobacco, and vaping devices, such as e-cigarettes. If you need help quitting, ask your health care provider. Talk with your health care provider about any travel plans. It is important to make sure that you are still able to take your medicine while traveling. Keep all follow-up visits. This is important. Where to find more information American Lung Association: www.lung.org Centers for Disease Control and Prevention: http://www.wolf.info/ Contact a health care provider if: You missed a dose of your blood thinner medicine. You have a fever. Get help right away if: You have: New or increased pain, swelling, warmth, or redness in an arm or leg. Shortness of breath that gets worse during activity or at rest. Worsening chest pain. A rapid or irregular heartbeat. A severe headache. Vision changes. A serious fall or accident, or you hit your head. Blood in your vomit, stool, or urine. A cut that will not stop bleeding. You cough up blood. You feel light-headed or dizzy, and that feeling does not go away. You cannot move your arms or legs. You are confused or have memory loss. These symptoms may represent a serious problem that is an emergency. Do not wait to see if the  symptoms will go away. Get medical help right away. Call your local emergency services (911 in the [place] Do not drive yourself to the hospital. Summary A pulmonary embolism (PE) is a serious and potentially life-threatening condition. It happens when a blood clot from one part of the body travels to the arteries of the lung, causing a sudden blockage or decrease of blood flow to the lungs. This may result in shortness of breath, chest pain, dizziness, and fainting. Treatments for this condition usually include medicines to thin your blood (anticoagulants) or medicines to break apart blood clots. If you are given blood thinners, take your medicine exactly as told by your health care provider, at the same time every day. This is important. Understand what foods and drugs interact with any medicines that you are taking. If you have signs of PE or DVT, call your local emergency services (911 in the [place] This information is not intended to replace advice given to you by your health care provider. Make sure you discuss any questions you have with your healthcare provider. Document Revised: 01/25/2020 Document Reviewed: 01/25/2020 Elsevier Patient Education  2022 [employer].

## 2020-08-18 NOTE — Progress Notes (Signed)
Subjective:    Patient ID: Stephanie Dyer, female    DOB: 01-07-1976, 45 y.o.   MRN: [id number]  Chief Complaint  Patient presents with   Hospitalization Follow-up    [facility] 6/11- ER only    PT presents to the office today for hospital follow up. She went to the ED for left leg swelling and was diagnosed with DVT and PE.   Her CT scan showed, "1. No evidence of acute pulmonary embolism through the segmental  pulmonary arteries. There is a hypodense LEFT lower subsegmental  pulmonary artery which leads to a LEFT basilar opacity with internal  lucency which may reflect a small area of pulmonary infarction. This  is consistent with age indeterminate subsegmental pulmonary embolism  and pulmonary infarction. No evidence of RIGHT heart strain.  2. Mosaic attenuation could reflect small airways disease.  3. Unchanged scattered pulmonary nodules. If patient is high risk,  follow-up CT in 1 year could be considered."  She was discharged on Eliqus, but states she could not get the prescriptions. So the prescription was changed to 5 days of Lovenox, and told to follow up with PCP.   She has not missed any doses of Lovenox.   She reports she started having SOB yesterday. She reports she is fine if sitting, but with exertion she is having SOB and chest pain.  Shortness of Breath The current episode started in the past 7 days. The problem occurs every few minutes.     Review of Systems  Respiratory:  Positive for shortness of breath.   All other systems reviewed and are negative.     Objective:   Physical Exam Vitals reviewed.  Constitutional:      General: She is not in acute distress.    Appearance: She is well-developed.  HENT:     Head: Normocephalic and atraumatic.     Right Ear: Tympanic membrane normal.     Left Ear: Tympanic membrane normal.  Eyes:     Pupils: Pupils are equal, round, and reactive to light.  Neck:     Thyroid: No thyromegaly.  Cardiovascular:      Rate and Rhythm: Normal rate and regular rhythm.     Heart sounds: Normal heart sounds. No murmur heard. Pulmonary:     Effort: Pulmonary effort is normal. No respiratory distress.     Breath sounds: Normal breath sounds. No wheezing.  Abdominal:     General: Bowel sounds are normal. There is no distension.     Palpations: Abdomen is soft.     Tenderness: There is no abdominal tenderness.  Musculoskeletal:        General: No tenderness. Normal range of motion.     Cervical back: Normal range of motion and neck supple.  Skin:    General: Skin is warm and dry.  Neurological:     Mental Status: She is alert and oriented to person, place, and time.     Cranial Nerves: No cranial nerve deficit.     Deep Tendon Reflexes: Reflexes are normal and symmetric.  Psychiatric:        Behavior: Behavior normal.        Thought Content: Thought content normal.        Judgment: Judgment normal.      BP 98/66   Pulse 73   Temp 97.9 F (36.6 C) (Temporal)   Wt 206 lb (93.4 kg)   LMP 11/04/2018   SpO2 94%   BMI 36.49 kg/m  Assessment & Plan:  Stephanie Dyer comes in today with chief complaint of Hospitalization Follow-up [facility] 6/11- ER only )   Diagnosis and orders addressed:  1. Acute deep vein thrombosis (DVT) of proximal vein of left lower extremity (HCC) - RIVAROXABAN (XARELTO) VTE STARTER PACK (15 & 20 MG); Follow package directions: Take one 15mg  tablet by mouth twice a day. On day 22, switch to one 20mg  tablet once a day. Take with food.  Dispense: 51 each; Refill: 0 - rivaroxaban (XARELTO) 20 MG TABS tablet; Take 1 tablet (20 mg total) by mouth daily with supper.  Dispense: 90 tablet; Refill: 1 - Ambulatory referral to Hematology / Oncology  2. Other acute pulmonary embolism without acute cor pulmonale (HCC) - RIVAROXABAN (XARELTO) VTE STARTER PACK (15 & 20 MG); Follow package directions: Take one 15mg  tablet by mouth twice a day. On day 22, switch to one 20mg  tablet  once a day. Take with food.  Dispense: 51 each; Refill: 0 - rivaroxaban (XARELTO) 20 MG TABS tablet; Take 1 tablet (20 mg total) by mouth daily with supper.  Dispense: 90 tablet; Refill: 1 - Ambulatory referral to Hematology / Oncology  3. Hospital discharge follow-up   Will give Xarelto, if unable to get will have to change to warfarin If SOB or chest pain will go to ED  [name], FNP

## 2020-08-19 ENCOUNTER — Telehealth: Payer: Self-pay | Admitting: Family Medicine

## 2020-08-19 NOTE — Telephone Encounter (Signed)
Xarelto Starter pack (take until complete) then patient to start loose tablets (xarelto 20mg  with supper)

## 2020-08-19 NOTE — Telephone Encounter (Signed)
Will get Almyra Free to help get medication covered.

## 2020-08-19 NOTE — Telephone Encounter (Signed)
Appointment scheduled.

## 2020-08-21 ENCOUNTER — Ambulatory Visit (HOSPITAL_COMMUNITY)
Admission: RE | Admit: 2020-08-21 | Discharge: 2020-08-21 | Disposition: A | Discharge status: Home / Self Care | Payer: Medicare Other | Source: Ambulatory Visit | Attending: Family Medicine | Admitting: Family Medicine

## 2020-08-21 ENCOUNTER — Encounter: Payer: Self-pay | Admitting: Family Medicine

## 2020-08-21 ENCOUNTER — Ambulatory Visit (INDEPENDENT_AMBULATORY_CARE_PROVIDER_SITE_OTHER): Payer: Medicare Other | Admitting: Family Medicine

## 2020-08-21 ENCOUNTER — Other Ambulatory Visit: Payer: Self-pay

## 2020-08-21 VITALS — BP 112/75 | HR 82 | Temp 97.7°F | Ht 63.0 in | Wt 202.8 lb

## 2020-08-21 DIAGNOSIS — I2699 Other pulmonary embolism without acute cor pulmonale: Secondary | ICD-10-CM | POA: Diagnosis present

## 2020-08-21 DIAGNOSIS — I824Y2 Acute embolism and thrombosis of unspecified deep veins of left proximal lower extremity: Secondary | ICD-10-CM | POA: Diagnosis present

## 2020-08-21 DIAGNOSIS — F603 Borderline personality disorder: Secondary | ICD-10-CM

## 2020-08-21 DIAGNOSIS — I2693 Single subsegmental pulmonary embolism without acute cor pulmonale: Secondary | ICD-10-CM | POA: Insufficient documentation

## 2020-08-21 DIAGNOSIS — H5711 Ocular pain, right eye: Secondary | ICD-10-CM | POA: Diagnosis present

## 2020-08-21 NOTE — Progress Notes (Signed)
Assessment & Plan:  1. Pain of right eye MRA Head ordered to assess for clot behind the right eye due to recent diagnoses of DVT and PE. While she has been on Lovenox x5 days, it is a lower dose than would be therapeutic to treat the DVT and PE. Her MRA is scheduled for 7 PM this evening at [facility]. I recommended she go see Dr. Marin Comment at [place] right now to make sure nothing else is going on.  - MR Angiogram Head Wo Contrast; Future  2-3. Acute deep vein thrombosis (DVT) of proximal vein of left lower extremity (HCC)/Single subsegmental pulmonary embolism without acute cor pulmonale (HCC)  Advised to start Xarelto 15 mg with food twice daily today and that I want her to get in both doses today.  - MR Angiogram Head Wo Contrast; Future  4. Borderline personality disorder [facility]) Referral re-entered.  - Ambulatory referral to Psychiatry   Hendricks Limes, MSN, APRN, FNP-C [facility]  Subjective:    Patient ID: Stephanie Dyer, female    DOB: 1975/11/02, 45 y.o.   MRN: [id number]  Patient Care Team: Loman Brooklyn, FNP as PCP - General (Family Medicine) Eloise Harman, DO as Consulting Physician (Internal Medicine)   Chief Complaint:  Chief Complaint  Patient presents with   Follow-up    2 week follow up- left lower leg blood clot     HPI: Stephanie Dyer is a 45 y.o. female presenting on 08/21/2020 for Follow-up (2 week follow up- left lower leg blood clot )  Patient is following up on a left lower extremity DVT and PE. These were diagnosed 5 days ago at [facility]. Patient has been on Lovenox 80 mg once daily. She took her 5th dose this morning. She states she started having pain in her right leg on the lateral aspect at the knee/behind the knee. She has also been having a sharp shooting pain behind her right eye since she woke up yesterday morning. She has an appointment scheduled with hematology on 08/29/2020. She is still experiencing chest  pain and shortness of breath with minimal exertion, but it is not worse. She lives on the 3rd floor of her apartment complex and it takes her 30 minutes to get up the stairs with her current symptoms.   New complaints: Patient was referred to [place] due to a new diagnosis of borderline personality disorder. She reports today they do not have psychiatric services that can treat and needs a referral elsewhere.   Social history:  Relevant past medical, surgical, family and social history reviewed and updated as indicated. Interim medical history since our last visit reviewed.  Allergies and medications reviewed and updated.  DATA REVIEWED: CHART IN EPIC  ROS: Negative unless specifically indicated above in HPI.    Current Outpatient Medications:    albuterol (VENTOLIN HFA) 108 (90 Base) MCG/ACT inhaler, Inhale 2 puffs into the lungs every 6 (six) hours as needed., Disp: 18 g, Rfl: 4   cetirizine (ZYRTEC) 10 MG tablet, Take 10 mg by mouth daily., Disp: , Rfl:    cholecalciferol (VITAMIN D3) 25 MCG (1000 UNIT) tablet, Take 1,000 Units by mouth daily., Disp: , Rfl:    ELDERBERRY PO, Take by mouth., Disp: , Rfl:    EPINEPHrine 0.3 mg/0.3 mL IJ SOAJ injection, Inject 0.3 mg into the muscle as needed., Disp: , Rfl:    ferrous sulfate 325 (65 FE) MG EC tablet, Take 325 mg  by mouth daily., Disp: , Rfl:    HYDROcodone-acetaminophen (NORCO/VICODIN) 5-325 MG tablet, Take 1 tablet by mouth every 6 (six) hours as needed for severe pain., Disp: 10 tablet, Rfl: 0   HYDROcodone-acetaminophen (NORCO/VICODIN) 5-325 MG tablet, Take 2 tablets by mouth every 4 (four) hours as needed., Disp: 6 tablet, Rfl: 0   loratadine (CLARITIN) 10 MG tablet, Take 1 tablet by mouth daily., Disp: , Rfl:    ondansetron (ZOFRAN ODT) 4 MG disintegrating tablet, Take 1 tablet (4 mg total) by mouth every 8 (eight) hours as needed for nausea or vomiting., Disp: 20 tablet, Rfl: 0   pantoprazole (PROTONIX) 40 MG  tablet, Take 1 tablet (40 mg total) by mouth daily., Disp: 90 tablet, Rfl: 3   rivaroxaban (XARELTO) 20 MG TABS tablet, Take 1 tablet (20 mg total) by mouth daily with supper., Disp: 90 tablet, Rfl: 1   RIVAROXABAN (XARELTO) VTE STARTER PACK (15 & 20 MG), Follow package directions: Take one 15mg  tablet by mouth twice a day. On day 22, switch to one 20mg  tablet once a day. Take with food., Disp: 51 each, Rfl: 0   valACYclovir (VALTREX) 1000 MG tablet, 2 PO BID x  1 day at onset of fever blister, Disp: 12 tablet, Rfl: 0   Allergies  Allergen Reactions   Codeine Anaphylaxis    Other reaction(s): Projectile vomiting, throat closes   Morphine Anaphylaxis    Other reaction(s): Projectile vomiting, throat clos   Other Anaphylaxis    peanuts   Fioricet [Butalbital-Apap-Caffeine] Other (See Comments)    Headache   Nsaids Other (See Comments)    Not allowed d/t gastric bypass.   Shellfish Allergy Swelling   Vancomycin Itching and Other (See Comments)    Itching & flushed skin.   Venlafaxine Other (See Comments)    GI upset   Zolpidem Other (See Comments)    Parasomnia   Latex Rash   Past Medical History:  Diagnosis Date   Angio-edema    Ankylosing spondylitis (HCC)    Anxiety    Arthritis    Asthma    Cervical cancer ([place]) 1998   Elevated LFTs    Environmental allergies    GERD (gastroesophageal reflux disease)    History of gastric bypass    History of iron deficiency anemia    Hypoglycemia    Kidney stones    Lumbar back pain    Migraines    PCOS (polycystic ovarian syndrome)    Pseudotumor cerebri 2006   Thyroid cyst    Urticaria    Vitamin B12 deficiency 03/31/2020   Vitamin D deficiency     Past Surgical History:  Procedure Laterality Date   ADENOIDECTOMY     CESAREAN SECTION  2016   [place]  2015   GASTRIC BYPASS  2014   KNEE SURGERY Left 2001   LITHOTRIPSY  2021   TONSILLECTOMY     TOTAL ABDOMINAL HYSTERECTOMY  10/2018     Social History   Socioeconomic History   Marital status: Legally Separated    Spouse name: Not on file   Number of children: Not on file   Years of education: Not on file   Highest education level: Not on file  Occupational History   Not on file  Tobacco Use   Smoking status: Former    Pack years: 0.00    Types: Cigarettes    Quit date: 11/27/2012    Years since quitting: 7.7   Smokeless tobacco:  Never  Vaping Use   Vaping Use: Never used  Substance and Sexual Activity   Alcohol use: Not Currently   Drug use: Yes    Types: Marijuana   Sexual activity: Yes    Birth control/protection: Other-see comments  Other Topics Concern   Not on file  Social History Narrative   Not on file   Social Determinants of Health   Financial Resource Strain: Not on file  Food Insecurity: Not on file  Transportation Needs: Not on file  Physical Activity: Not on file  Stress: Not on file  Social Connections: Not on file  Intimate Partner Violence: Not on file        Objective:    BP 112/75   Pulse 82   Temp 97.7 F (36.5 C) (Temporal)   Ht 5\' 3"  (1.6 m)   Wt 202 lb 12.8 oz (92 kg)   LMP 11/04/2018   SpO2 96%   BMI 35.92 kg/m   Wt Readings from Last 3 Encounters:  08/21/20 202 lb 12.8 oz (92 kg)  08/18/20 206 lb (93.4 kg)  07/29/20 206 lb 12.8 oz (93.8 kg)    Physical Exam Vitals reviewed.  Constitutional:      General: She is not in acute distress.    Appearance: Normal appearance. She is obese. She is not ill-appearing, toxic-appearing or diaphoretic.  HENT:     Head: Normocephalic and atraumatic.  Eyes:     General: No scleral icterus.       Right eye: No discharge.        Left eye: No discharge.     Conjunctiva/sclera: Conjunctivae normal.  Cardiovascular:     Rate and Rhythm: Normal rate.  Pulmonary:     Effort: Pulmonary effort is normal. No respiratory distress.  Musculoskeletal:        General: Normal range of motion.     Cervical back: Normal range of  motion.  Skin:    General: Skin is warm and dry.     Capillary Refill: Capillary refill takes less than 2 seconds.  Neurological:     General: No focal deficit present.     Mental Status: She is alert and oriented to person, place, and time. Mental status is at baseline.  Psychiatric:        Mood and Affect: Mood normal.        Behavior: Behavior normal.        Thought Content: Thought content normal.        Judgment: Judgment normal.    Lab Results  Component Value Date   TSH 2.640 11/28/2019   Lab Results  Component Value Date   WBC 7.7 08/12/2020   HGB 13.1 08/12/2020   HCT 41.5 08/12/2020   MCV 90.6 08/12/2020   PLT 228 08/12/2020   Lab Results  Component Value Date   NA 136 08/12/2020   K 3.8 08/12/2020   CO2 26 08/12/2020   GLUCOSE 89 08/12/2020   BUN 12 08/12/2020   CREATININE 0.71 08/12/2020   BILITOT 0.8 08/12/2020   ALKPHOS 72 08/12/2020   AST 20 08/12/2020   ALT 16 08/12/2020   PROT 6.4 (L) 08/12/2020   ALBUMIN 3.1 (L) 08/12/2020   CALCIUM 8.6 (L) 08/12/2020   ANIONGAP 4 (L) 08/12/2020   Lab Results  Component Value Date   CHOL 142 11/28/2019   Lab Results  Component Value Date   HDL 64 11/28/2019   Lab Results  Component Value Date   LDLCALC 64 11/28/2019  Lab Results  Component Value Date   TRIG 68 11/28/2019   Lab Results  Component Value Date   CHOLHDL 2.2 11/28/2019   No results found for: HGBA1C

## 2020-08-21 NOTE — Patient Instructions (Signed)
Go see Dr. Marin Comment at [facility] in [place] today about your eye.

## 2020-08-28 ENCOUNTER — Encounter (HOSPITAL_COMMUNITY): Payer: Self-pay
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
## 2020-08-29 ENCOUNTER — Ambulatory Visit (HOSPITAL_COMMUNITY)
Admission: RE | Admit: 2020-08-29 | Discharge: 2020-08-29 | Disposition: A | Discharge status: Home / Self Care | Payer: Medicare Other | Source: Ambulatory Visit | Attending: Hematology and Oncology | Admitting: Hematology and Oncology

## 2020-08-29 ENCOUNTER — Other Ambulatory Visit: Payer: Self-pay

## 2020-08-29 ENCOUNTER — Inpatient Hospital Stay (HOSPITAL_COMMUNITY): Payer: Medicare Other

## 2020-08-29 ENCOUNTER — Ambulatory Visit (HOSPITAL_COMMUNITY): Admission: RE | Admit: 2020-08-29 | Payer: Medicare Other | Source: Ambulatory Visit

## 2020-08-29 ENCOUNTER — Encounter (HOSPITAL_COMMUNITY): Payer: Self-pay | Admitting: Hematology and Oncology

## 2020-08-29 ENCOUNTER — Inpatient Hospital Stay (HOSPITAL_COMMUNITY): Payer: Medicare Other | Attending: Hematology and Oncology | Admitting: Hematology and Oncology

## 2020-08-29 ENCOUNTER — Other Ambulatory Visit: Payer: Self-pay | Admitting: Hematology and Oncology

## 2020-08-29 VITALS — BP 102/61 | HR 79 | Temp 96.8°F | Resp 18 | Ht 65.0 in | Wt 207.1 lb

## 2020-08-29 DIAGNOSIS — Z87891 Personal history of nicotine dependence: Secondary | ICD-10-CM | POA: Diagnosis not present

## 2020-08-29 DIAGNOSIS — E611 Iron deficiency: Secondary | ICD-10-CM | POA: Insufficient documentation

## 2020-08-29 DIAGNOSIS — F1290 Cannabis use, unspecified, uncomplicated: Secondary | ICD-10-CM | POA: Diagnosis not present

## 2020-08-29 DIAGNOSIS — Z86711 Personal history of pulmonary embolism: Secondary | ICD-10-CM | POA: Diagnosis present

## 2020-08-29 DIAGNOSIS — M459 Ankylosing spondylitis of unspecified sites in spine: Secondary | ICD-10-CM | POA: Diagnosis not present

## 2020-08-29 DIAGNOSIS — Z8541 Personal history of malignant neoplasm of cervix uteri: Secondary | ICD-10-CM | POA: Diagnosis not present

## 2020-08-29 DIAGNOSIS — E282 Polycystic ovarian syndrome: Secondary | ICD-10-CM | POA: Diagnosis not present

## 2020-08-29 DIAGNOSIS — M79605 Pain in left leg: Secondary | ICD-10-CM | POA: Insufficient documentation

## 2020-08-29 DIAGNOSIS — D509 Iron deficiency anemia, unspecified: Secondary | ICD-10-CM

## 2020-08-29 DIAGNOSIS — Z7901 Long term (current) use of anticoagulants: Secondary | ICD-10-CM | POA: Diagnosis not present

## 2020-08-29 DIAGNOSIS — R0602 Shortness of breath: Secondary | ICD-10-CM | POA: Insufficient documentation

## 2020-08-29 DIAGNOSIS — I2699 Other pulmonary embolism without acute cor pulmonale: Secondary | ICD-10-CM

## 2020-08-29 DIAGNOSIS — Z9884 Bariatric surgery status: Secondary | ICD-10-CM | POA: Diagnosis not present

## 2020-08-29 DIAGNOSIS — Z86718 Personal history of other venous thrombosis and embolism: Secondary | ICD-10-CM

## 2020-08-29 LAB — IRON AND TIBC
Iron: 78 ug/dL (ref 28–170)
Saturation Ratios: 15 % (ref 10.4–31.8)
TIBC: 516 ug/dL — ABNORMAL HIGH (ref 250–450)
UIBC: 438 ug/dL

## 2020-08-29 LAB — FERRITIN: Ferritin: 7 ng/mL — ABNORMAL LOW (ref 11–307)

## 2020-08-29 LAB — ANTITHROMBIN III: AntiThromb III Func: 119 % (ref 75–120)

## 2020-08-30 LAB — CARDIOLIPIN ANTIBODIES, IGG, IGM, IGA
Anticardiolipin IgA: <9 APL U/mL (ref 0–11)
Anticardiolipin IgG: 9 GPL U/mL (ref 0–14)
Anticardiolipin IgM: 9 MPL U/mL (ref 0–12)

## 2020-08-30 LAB — BETA-2-GLYCOPROTEIN I ABS, IGG/M/A
Beta-2 Glyco I IgG: <9 GPI IgG units (ref 0–20)
Beta-2-Glycoprotein I IgA: <9 GPI IgA units (ref 0–25)
Beta-2-Glycoprotein I IgM: <9 GPI IgM units (ref 0–32)

## 2020-09-02 LAB — PROTEIN S ACTIVITY: Protein S Activity: 77 % (ref 63–140)

## 2020-09-02 LAB — PROTEIN C ACTIVITY: Protein C Activity: 114 % (ref 73–180)

## 2020-09-04 LAB — FACTOR 5 LEIDEN

## 2020-09-05 LAB — PROTHROMBIN GENE MUTATION

## 2020-09-11 ENCOUNTER — Other Ambulatory Visit: Payer: Self-pay

## 2020-09-11 ENCOUNTER — Ambulatory Visit (INDEPENDENT_AMBULATORY_CARE_PROVIDER_SITE_OTHER): Payer: Medicare Other | Admitting: Family Medicine

## 2020-09-11 ENCOUNTER — Encounter: Payer: Self-pay | Admitting: Family Medicine

## 2020-09-11 VITALS — BP 112/75 | HR 102 | Temp 97.4°F | Ht 65.0 in | Wt 202.4 lb

## 2020-09-11 DIAGNOSIS — F603 Borderline personality disorder: Secondary | ICD-10-CM

## 2020-09-11 DIAGNOSIS — G43109 Migraine with aura, not intractable, without status migrainosus: Secondary | ICD-10-CM | POA: Diagnosis not present

## 2020-09-11 DIAGNOSIS — F419 Anxiety disorder, unspecified: Secondary | ICD-10-CM

## 2020-09-11 DIAGNOSIS — I2699 Other pulmonary embolism without acute cor pulmonale: Secondary | ICD-10-CM

## 2020-09-11 DIAGNOSIS — F32A Depression, unspecified: Secondary | ICD-10-CM

## 2020-09-11 MED ORDER — FLUOXETINE HCL 20 MG PO CAPS: 20.0000 mg | ORAL_CAPSULE | Freq: Every day | ORAL | 2 refills | Status: DC
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
## 2020-09-12 ENCOUNTER — Encounter (HOSPITAL_COMMUNITY): Payer: Self-pay | Admitting: Hematology and Oncology

## 2020-09-12 ENCOUNTER — Other Ambulatory Visit (HOSPITAL_COMMUNITY): Payer: Self-pay | Admitting: *Deleted

## 2020-09-12 ENCOUNTER — Inpatient Hospital Stay (HOSPITAL_COMMUNITY): Payer: Medicare Other | Attending: Hematology and Oncology | Admitting: Hematology and Oncology

## 2020-09-12 DIAGNOSIS — I2699 Other pulmonary embolism without acute cor pulmonale: Secondary | ICD-10-CM

## 2020-09-12 DIAGNOSIS — D509 Iron deficiency anemia, unspecified: Secondary | ICD-10-CM

## 2020-09-12 DIAGNOSIS — Z7901 Long term (current) use of anticoagulants: Secondary | ICD-10-CM | POA: Diagnosis not present

## 2020-09-12 DIAGNOSIS — I82409 Acute embolism and thrombosis of unspecified deep veins of unspecified lower extremity: Secondary | ICD-10-CM | POA: Diagnosis not present

## 2020-09-12 MED ORDER — RIVAROXABAN 20 MG PO TABS: 20.0000 mg | ORAL_TABLET | Freq: Every day | ORAL | 3 refills | Status: DC
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
## 2020-09-15 ENCOUNTER — Other Ambulatory Visit (HOSPITAL_COMMUNITY): Payer: Self-pay

## 2020-09-15 DIAGNOSIS — D509 Iron deficiency anemia, unspecified: Secondary | ICD-10-CM

## 2020-09-26 ENCOUNTER — Ambulatory Visit (HOSPITAL_COMMUNITY): Payer: Medicare Other

## 2020-09-30 ENCOUNTER — Other Ambulatory Visit: Payer: Medicare Other

## 2020-09-30 ENCOUNTER — Other Ambulatory Visit: Payer: Self-pay

## 2020-10-01 ENCOUNTER — Ambulatory Visit (INDEPENDENT_AMBULATORY_CARE_PROVIDER_SITE_OTHER): Payer: Medicare Other | Admitting: *Deleted

## 2020-10-01 DIAGNOSIS — Z0000 Encounter for general adult medical examination without abnormal findings: Secondary | ICD-10-CM

## 2020-10-01 NOTE — Progress Notes (Signed)
MEDICARE ANNUAL WELLNESS VISIT  10/01/2020  Telephone Visit Disclaimer This Medicare AWV was conducted by telephone due to national recommendations for restrictions regarding the COVID-19 Pandemic (e.g. social distancing).  I verified, using two identifiers, that I am speaking with Stephanie Dyer or their authorized healthcare agent. I discussed the limitations, risks, security, and privacy concerns of performing an evaluation and management service by telephone and the potential availability of an in-person appointment in the future. The patient expressed understanding and agreed to proceed.  Location of Patient: Car and patient was alone Location of Provider (nurse):  [facility]  Subjective:    Stephanie Dyer is a 45 y.o. female patient of Loman Brooklyn, FNP who had a Medicare Annual Wellness Visit today via telephone. Stephanie Dyer is Working part time and lives with her children. she has 2 children. she reports that she is socially active and does interact with friends/family regularly. she is moderately physically active and enjoys cooking.  Patient Care Team: Loman Brooklyn, FNP as PCP - General (Family Medicine) Eloise Harman, DO as Consulting Physician (Internal Medicine)  Advanced Directives 10/01/2020 09/12/2020 08/29/2020 08/12/2020  Does Patient Have a Medical Advance Directive? No No No Yes  Type of Advance Directive - - - Living will  Would patient like information on creating a medical advance directive? No - Patient declined No - Patient declined No - Patient declined No - Patient declined    Hospital Utilization Over the Past 12 Months: # of hospitalizations or ER visits: 2-ED visits # of surgeries: 0  Review of Systems    Patient reports that her overall health is better compared to last year. Due to weight loss.  Musculoskeletal ROS: positive for - pain in knee - right  Patient Reported Readings (BP, Pulse, CBG, Weight, etc) none  Pain  Assessment       Current Medications & Allergies (verified) Allergies as of 10/01/2020       Reactions   Codeine Anaphylaxis   Other reaction(s): Projectile vomiting, throat closes   Morphine Anaphylaxis   Other reaction(s): Projectile vomiting, throat clos   Other Anaphylaxis   peanuts   Fioricet [butalbital-apap-caffeine] Other (See Comments)   Headache   Nsaids Other (See Comments)   Not allowed d/t gastric bypass.   Shellfish Allergy Swelling   Vancomycin Itching, Other (See Comments)   Itching & flushed skin.   Venlafaxine Other (See Comments)   GI upset   Zolpidem Other (See Comments)   Ambien-Parasomnia   Latex Rash        Medication List        Accurate as of October 01, 2020 10:14 AM. If you have any questions, ask your nurse or doctor.          albuterol 108 (90 Base) MCG/ACT inhaler Commonly known as: VENTOLIN HFA Inhale 2 puffs into the lungs every 6 (six) hours as needed.   cetirizine 10 MG tablet Commonly known as: ZYRTEC Take 10 mg by mouth daily.   EPINEPHrine 0.3 mg/0.3 mL Soaj injection Commonly known as: EPI-PEN Inject 0.3 mg into the muscle as needed.   FLUoxetine 20 MG capsule Commonly known as: PROZAC Take 1 capsule (20 mg total) by mouth daily.   pantoprazole 40 MG tablet Commonly known as: PROTONIX Take 1 tablet (40 mg total) by mouth daily.   rivaroxaban 20 MG Tabs tablet Commonly known as: XARELTO Take 1 tablet (20 mg total) by mouth daily with supper.   topiramate  50 MG tablet Commonly known as: TOPAMAX Take 75 mg by mouth daily.        History (reviewed): Past Medical History:  Diagnosis Date   Angio-edema    Ankylosing spondylitis (HCC)    Anxiety    Arthritis    Asthma    Cervical cancer ([place]) 1998   Elevated LFTs    Environmental allergies    GERD (gastroesophageal reflux disease)    History of gastric bypass    History of iron deficiency anemia    Hypoglycemia    Kidney stones    Lumbar back pain     Migraines    PCOS (polycystic ovarian syndrome)    Pseudotumor cerebri 2006   Thyroid cyst    Urticaria    Vitamin B12 deficiency 03/31/2020   Vitamin D deficiency    Past Surgical History:  Procedure Laterality Date   ADENOIDECTOMY     CESAREAN SECTION  2016   [place]  2015   GASTRIC BYPASS  2014   KNEE SURGERY Left 2001   LITHOTRIPSY  2021   TONSILLECTOMY     TOTAL ABDOMINAL HYSTERECTOMY  10/2018   Family History  Problem Relation Age of Onset   Alcohol abuse Mother    Diabetes Mother    COPD Mother    Emphysema Mother    Asthma Mother    Heart disease Father    Heart attack Father    CVA Brother    Diabetes Brother    Hypertension Brother    Autism Son    Hearing loss Son    Diabetes Maternal Grandmother    Parkinson's disease Maternal Grandmother    Macular degeneration Maternal Grandmother    Thyroid cancer Maternal Grandmother    Stomach cancer Maternal Grandmother    Cervical cancer Maternal Grandmother    Migraines Maternal Grandmother    Suicidality Maternal Grandfather    Allergic rhinitis Neg Hx    Eczema Neg Hx    Urticaria Neg Hx    Breast cancer Neg Hx    Social History   Socioeconomic History   Marital status: Legally Separated    Spouse name: Not on file   Number of children: 2   Years of education: Not on file   Highest education level: Not on file  Occupational History   Not on file  Tobacco Use   Smoking status: Former    Types: Cigarettes    Quit date: 11/27/2012    Years since quitting: 7.8   Smokeless tobacco: Never  Vaping Use   Vaping Use: Never used  Substance and Sexual Activity   Alcohol use: Not Currently   Drug use: Not Currently    Types: Marijuana   Sexual activity: Yes    Birth control/protection: Other-see comments  Other Topics Concern   Not on file  Social History Narrative   Stephanie Dyer- 26, Alex- 7   Social Determinants of Health   Financial Resource Strain: Medium Risk    Difficulty of Paying Living Expenses: Somewhat hard  Food Insecurity: No Food Insecurity   Worried About Charity fundraiser in the Last Year: Never true   Arboriculturist in the Last Year: Never true  Transportation Needs: No Transportation Needs   Lack of Transportation (Medical): No   Lack of Transportation (Non-Medical): No  Physical Activity: Sufficiently Active   Days of Exercise per Week: 7 days   Minutes of Exercise per Session: 40 min  Stress: Stress  Concern Present   Feeling of Stress : Very much  Social Connections: Socially Isolated   Frequency of Communication with Friends and Family: More than three times a week   Frequency of Social Gatherings with Friends and Family: Never   Attends Religious Services: Never   Marine scientist or Organizations: No   Attends Archivist Meetings: Never   Marital Status: Separated    Activities of Daily Living In your present state of health, do you have any difficulty performing the following activities: 10/01/2020  Hearing? Y  Comment Hearing loss-bilateral- she is supposed to wear hearing aids  Vision? N  Comment Wears glasses  Difficulty concentrating or making decisions? Y  Comment Since having COVID she currently has Production designer, theatre/television/film"  Walking or climbing stairs? Y  Comment Climbing stairs because of right knee pain  Dressing or bathing? N  Doing errands, shopping? N  Preparing Food and eating ? N  Using the Toilet? N  In the past six months, have you accidently leaked urine? N  Do you have problems with loss of bowel control? N  Managing your Medications? N  Managing your Finances? N  Housekeeping or managing your Housekeeping? N  Some recent data might be hidden    Patient Education/ Literacy How often do you need to have someone help you when you read instructions, pamphlets, or other written materials from your doctor or pharmacy?: 1 - Never What is the last grade level you completed in school?:  12th  Exercise Current Exercise Habits: Home exercise routine, Time (Minutes): 50, Frequency (Times/Week): 7, Weekly Exercise (Minutes/Week): 350, Intensity: Moderate  Diet Patient reports consuming  1-2  meals a day and 0 snack(s) a day Patient reports that her primary diet is: Regular Patient reports that she does have regular access to food.   Depression Screen PHQ 2/9 Scores 10/01/2020 09/11/2020 08/18/2020 07/29/2020 06/16/2020 03/28/2020 12/04/2019  PHQ - 2 Score 0 5 0 0 0 6 0  PHQ- 9 Score 2 22 - 6 - 23 -   Patient states since starting her antidepressant she is feeling much better.  Fall Risk Fall Risk  10/01/2020 09/11/2020 08/18/2020 06/16/2020 12/04/2019  Falls in the past year? 1 1 0 1 0  Number falls in past yr: 0 0 - 0 -  Injury with Fall? 1 1 - 0 -  Risk for fall due to : Other (Comment) - - History of fall(s) -  Risk for fall due to: Comment Patient was running when she fell - - - -  Follow up Falls prevention discussed Falls prevention discussed - Falls evaluation completed -     Objective:  Stephanie Dyer seemed alert and oriented and she participated appropriately during our telephone visit.  Blood Pressure Weight BMI  BP Readings from Last 3 Encounters:  09/11/20 112/75  08/29/20 102/61  08/21/20 112/75   Wt Readings from Last 3 Encounters:  09/11/20 202 lb 6.4 oz (91.8 kg)  08/29/20 207 lb 1.6 oz (93.9 kg)  08/21/20 202 lb 12.8 oz (92 kg)   BMI Readings from Last 1 Encounters:  09/11/20 33.68 kg/m    *Unable to obtain current vital signs, weight, and BMI due to telephone visit type  Hearing/Vision  Stephanie Dyer did not seem to have difficulty with hearing/understanding during the telephone conversation Reports that she has an appointment tomorrow for a formal eye exam by an eye care professional  Reports that she has not had a formal hearing evaluation within the past  year *Unable to fully assess hearing and vision during telephone visit type  Cognitive  Function: 6CIT Screen 10/01/2020  What Year? 0 points  What month? 0 points  What time? 0 points  Count back from 20 0 points  Months in reverse 0 points  Repeat phrase 0 points  Total Score 0   (Normal:0-7, Significant for Dysfunction: >8)  Normal Cognitive Function Screening: Yes   Immunization & Health Maintenance Record Immunization History  Administered Date(s) Administered   Tdap 03/29/2015    Health Maintenance  Topic Date Due   COVID-19 Vaccine (1) 10/17/2020 (Originally 07/24/1980)   INFLUENZA VACCINE  10/06/2020   MAMMOGRAM  07/31/2022   TETANUS/TDAP  03/28/2025   Hepatitis C Screening  Completed   HIV Screening  Completed   Pneumococcal Vaccine 46-60 Years old  Aged Out   HPV VACCINES  Aged Out       Assessment  This is a routine wellness examination for [employer].  Health Maintenance: Due or Overdue There are no preventive care reminders to display for this patient.  Munira Haapala does not need a referral for Community Assistance: Care Management:   no Social Work:    no Prescription Assistance:  no Nutrition/Diabetes Education:  no   Plan:  Personalized Goals  Goals Addressed   None    Personalized Health Maintenance & Screening Recommendations  COVID Vaccine  Lung Cancer Screening Recommended: no (Low Dose CT Chest recommended if Age 5-80 years, 30 pack-year currently smoking OR have quit w/in past 15 years) Hepatitis C Screening recommended: no HIV Screening recommended: no  Advanced Directives: Written information was not prepared per patient's request.  Referrals & Orders No orders of the defined types were placed in this encounter.   Follow-up Plan Follow-up with Loman Brooklyn, FNP as planned AVS printed and mailed to patient    I have personally reviewed and noted the following in the patient's chart:   Medical and social history Use of alcohol, tobacco or illicit drugs  Current medications and supplements Functional  ability and status Nutritional status Physical activity Advanced directives List of other physicians Hospitalizations, surgeries, and ER visits in previous 12 months Vitals Screenings to include cognitive, depression, and falls Referrals and appointments  In addition, I have reviewed and discussed with Stephanie Dyer certain preventive protocols, quality metrics, and best practice recommendations. A written personalized care plan for preventive services as well as general preventive health recommendations is available and can be mailed to the patient at her request.      Lynnea Ferrier, LPN  [phone]

## 2020-10-01 NOTE — Patient Instructions (Signed)
  [facility] Maintenance Summary and Written Plan of Care  [name] ,  Thank you for allowing me to perform your Medicare Annual Wellness Visit and for your ongoing commitment to your health.   Health Maintenance & Immunization History Health Maintenance  Topic Date Due   COVID-19 Vaccine (1) 10/17/2020 (Originally 07/24/1980)   INFLUENZA VACCINE  10/06/2020   MAMMOGRAM  07/31/2022   TETANUS/TDAP  03/28/2025   Hepatitis C Screening  Completed   HIV Screening  Completed   Pneumococcal Vaccine 71-56 Years old  Aged Out   HPV VACCINES  Aged Out   Immunization History  Administered Date(s) Administered   Tdap 03/29/2015    These are the patient goals that we discussed:  Goals Addressed             This Visit's Progress    AWV       10/01/2020 AWV Goal: Fall Prevention  Over the next year, patient will decrease their risk for falls by: Using assistive devices, such as a cane or walker, as needed Identifying fall risks within their home and correcting them by: Removing throw rugs Adding handrails to stairs or ramps Removing clutter and keeping a clear pathway throughout the home Increasing light, especially at night Adding shower handles/bars Raising toilet seat Identifying potential personal risk factors for falls: Medication side effects Incontinence/urgency Vestibular dysfunction Hearing loss Musculoskeletal disorders Neurological disorders Orthostatic hypotension           This is a list of Health Maintenance Items that are overdue or due now: There are no preventive care reminders to display for this patient.   Orders/Referrals Placed Today: No orders of the defined types were placed in this encounter.  (Contact our referral department at [phone] if you have not spoken with someone about your referral appointment within the next 5 days)    Follow-up Plan Follow up with [name], FNP as needed Keep eye exam  appointment

## 2020-10-14 ENCOUNTER — Ambulatory Visit (INDEPENDENT_AMBULATORY_CARE_PROVIDER_SITE_OTHER): Payer: Medicare Other | Admitting: Family Medicine

## 2020-10-14 ENCOUNTER — Encounter: Payer: Self-pay | Admitting: Family Medicine

## 2020-10-14 VITALS — BP 116/80 | HR 72 | Temp 97.9°F | Ht 65.0 in | Wt 202.4 lb

## 2020-10-14 DIAGNOSIS — J4521 Mild intermittent asthma with (acute) exacerbation: Secondary | ICD-10-CM

## 2020-10-14 DIAGNOSIS — R059 Cough, unspecified: Secondary | ICD-10-CM | POA: Diagnosis not present

## 2020-10-14 MED ORDER — METHYLPREDNISOLONE ACETATE 40 MG/ML IJ SUSP: 40.0000 mg | Freq: Once | INTRAMUSCULAR | Status: DC

## 2020-10-14 MED ORDER — PREDNISONE 20 MG PO TABS: ORAL_TABLET | ORAL | 0 refills | Status: DC

## 2020-10-14 MED ORDER — ALBUTEROL SULFATE HFA 108 (90 BASE) MCG/ACT IN AERS: 2.0000 | INHALATION_SPRAY | Freq: Four times a day (QID) | RESPIRATORY_TRACT | 4 refills | Status: DC | PRN
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
## 2020-11-03 ENCOUNTER — Other Ambulatory Visit: Payer: Self-pay | Admitting: Family

## 2020-11-05 ENCOUNTER — Ambulatory Visit (INDEPENDENT_AMBULATORY_CARE_PROVIDER_SITE_OTHER): Payer: Medicare Other | Admitting: Family Medicine

## 2020-11-05 ENCOUNTER — Encounter: Payer: Self-pay | Admitting: Family Medicine

## 2020-11-05 ENCOUNTER — Other Ambulatory Visit: Payer: Self-pay

## 2020-11-05 VITALS — BP 107/64 | HR 67 | Temp 97.9°F | Ht 65.0 in | Wt 197.0 lb

## 2020-11-05 DIAGNOSIS — L98491 Non-pressure chronic ulcer of skin of other sites limited to breakdown of skin: Secondary | ICD-10-CM

## 2020-11-05 NOTE — Progress Notes (Signed)
Assessment & Plan:  1. Skin ulcer, limited to breakdown of skin ([place]) - educated on keeping the area clean twice a day with mild soap and water, avoid peroxide to promote healing  - cover with dry dressing to prevent further irritation   Follow up plan: Return if symptoms worsen or fail to improve.  Lucile Crater, NP Student  I personally was present during the history, physical exam, and medical decision-making activities of this service and have verified that the service and findings are accurately documented in the nurse practitioner student's note.  Hendricks Limes, MSN, APRN, FNP-C [facility]   Subjective:   Patient ID: Stephanie Dyer, female    DOB: Jan 17, 1976, 45 y.o.   MRN: [id number]  HPI: Stephanie Dyer is a 45 y.o. female presenting on 11/05/2020 for belly button pain (Patient states that she thinks she hurt her belly button after training on Saturday.  Sunday started to be painful and oozing. )   She states she thinks she hurt her belly button after training on Saturday. She states she worked out for 9 hours using heavy Event organiser and does not know if she injured her belly button directly as she does not have feeling in her lower abdomen due to a tummy tuck 6 years ago. She has abdominal tenderness below the umbilicus.    ROS: Negative unless specifically indicated above in HPI.   Relevant past medical history reviewed and updated as indicated.   Allergies and medications reviewed and updated.   Current Outpatient Medications:    albuterol (VENTOLIN HFA) 108 (90 Base) MCG/ACT inhaler, Inhale 2 puffs into the lungs every 6 (six) hours as needed., Disp: 18 g, Rfl: 4   cetirizine (ZYRTEC) 10 MG tablet, Take 10 mg by mouth daily., Disp: , Rfl:    EPINEPHrine 0.3 mg/0.3 mL IJ SOAJ injection, Inject 0.3 mg into the muscle as needed., Disp: , Rfl:    FLUoxetine (PROZAC) 20 MG capsule, Take 1 capsule (20 mg total) by mouth daily., Disp: 30 capsule,  Rfl: 2   pantoprazole (PROTONIX) 40 MG tablet, Take 1 tablet by mouth once daily, Disp: 90 tablet, Rfl: 0   rivaroxaban (XARELTO) 20 MG TABS tablet, Take 1 tablet (20 mg total) by mouth daily with supper., Disp: 90 tablet, Rfl: 3   topiramate (TOPAMAX) 50 MG tablet, Take 75 mg by mouth daily., Disp: , Rfl:   Current Facility-Administered Medications:    methylPREDNISolone acetate (DEPO-MEDROL) injection 40 mg, 40 mg, Intramuscular, Once, Gottschalk, Ashly M, DO  Allergies  Allergen Reactions   Codeine Anaphylaxis    Other reaction(s): Projectile vomiting, throat closes   Morphine Anaphylaxis    Other reaction(s): Projectile vomiting, throat clos   Other Anaphylaxis    peanuts   Fioricet [Butalbital-Apap-Caffeine] Other (See Comments)    Headache   Nsaids Other (See Comments)    Not allowed d/t gastric bypass.   Shellfish Allergy Swelling   Vancomycin Itching and Other (See Comments)    Itching & flushed skin.   Venlafaxine Other (See Comments)    GI upset   Zolpidem Other (See Comments)    Ambien-Parasomnia   Latex Rash    Objective:   BP 107/64   Pulse 67   Temp 97.9 F (36.6 C) (Temporal)   Ht '5\' 5"'$  (1.651 m)   Wt 89.4 kg   LMP 11/04/2018   BMI 32.78 kg/m    Physical Exam Constitutional:      General: She is not in acute  distress.    Appearance: Normal appearance. She is obese. She is not ill-appearing, toxic-appearing or diaphoretic.  HENT:     Head: Normocephalic.     Nose: Nose normal.  Eyes:     Extraocular Movements: Extraocular movements intact.     Pupils: Pupils are equal, round, and reactive to light.  Cardiovascular:     Rate and Rhythm: Normal rate.  Pulmonary:     Effort: Pulmonary effort is normal.  Abdominal:     General: Bowel sounds are normal. There is no distension.     Palpations: Abdomen is soft. There is no mass.     Tenderness: There is abdominal tenderness (below umbilicus to palpation). There is no guarding or rebound.     Hernia:  No hernia is present.     Comments: Small ulceration in the umbilicus, approx. 51m x 434m Musculoskeletal:        General: Normal range of motion.     Cervical back: Normal range of motion.  Skin:    General: Skin is warm and dry.     Findings: No bruising or erythema.  Neurological:     Mental Status: She is alert and oriented to person, place, and time.  Psychiatric:        Mood and Affect: Mood normal.        Behavior: Behavior normal.        Thought Content: Thought content normal.        Judgment: Judgment normal.

## 2020-11-11 ENCOUNTER — Other Ambulatory Visit: Payer: Self-pay

## 2020-11-11 ENCOUNTER — Inpatient Hospital Stay (HOSPITAL_COMMUNITY): Payer: Medicare Other | Attending: Hematology

## 2020-11-11 DIAGNOSIS — D509 Iron deficiency anemia, unspecified: Secondary | ICD-10-CM

## 2020-11-11 DIAGNOSIS — E611 Iron deficiency: Secondary | ICD-10-CM | POA: Insufficient documentation

## 2020-11-11 DIAGNOSIS — Z86718 Personal history of other venous thrombosis and embolism: Secondary | ICD-10-CM | POA: Insufficient documentation

## 2020-11-11 DIAGNOSIS — Z87891 Personal history of nicotine dependence: Secondary | ICD-10-CM | POA: Insufficient documentation

## 2020-11-11 DIAGNOSIS — Z7901 Long term (current) use of anticoagulants: Secondary | ICD-10-CM | POA: Insufficient documentation

## 2020-11-11 DIAGNOSIS — Z86711 Personal history of pulmonary embolism: Secondary | ICD-10-CM | POA: Insufficient documentation

## 2020-11-11 LAB — COMPREHENSIVE METABOLIC PANEL
ALT: 16 U/L (ref 0–44)
AST: 17 U/L (ref 15–41)
Albumin: 3.3 g/dL — ABNORMAL LOW (ref 3.5–5.0)
Alkaline Phosphatase: 91 U/L (ref 38–126)
Anion gap: 3 — ABNORMAL LOW (ref 5–15)
BUN: 16 mg/dL (ref 6–20)
CO2: 23 mmol/L (ref 22–32)
Calcium: 8.4 mg/dL — ABNORMAL LOW (ref 8.9–10.3)
Chloride: 112 mmol/L — ABNORMAL HIGH (ref 98–111)
Creatinine, Ser: 0.71 mg/dL (ref 0.44–1.00)
GFR, Estimated: >60 mL/min (ref >60)
Glucose, Bld: 100 mg/dL — ABNORMAL HIGH (ref 70–99)
Potassium: 3.8 mmol/L (ref 3.5–5.1)
Sodium: 138 mmol/L (ref 135–145)
Total Bilirubin: 0.5 mg/dL (ref 0.3–1.2)
Total Protein: 6.5 g/dL (ref 6.5–8.1)

## 2020-11-11 LAB — CBC WITH DIFFERENTIAL/PLATELET
Abs Immature Granulocytes: 0.02 10*3/uL (ref 0.00–0.07)
Basophils Absolute: 0 10*3/uL (ref 0.0–0.1)
Basophils Relative: 1 %
Eosinophils Absolute: 0.1 10*3/uL (ref 0.0–0.5)
Eosinophils Relative: 2 %
HCT: 40.5 % (ref 36.0–46.0)
Hemoglobin: 12.9 g/dL (ref 12.0–15.0)
Immature Granulocytes: 0 %
Lymphocytes Relative: 32 %
Lymphs Abs: 1.7 10*3/uL (ref 0.7–4.0)
MCH: 28.1 pg (ref 26.0–34.0)
MCHC: 31.9 g/dL (ref 30.0–36.0)
MCV: 88.2 fL (ref 80.0–100.0)
Monocytes Absolute: 0.4 10*3/uL (ref 0.1–1.0)
Monocytes Relative: 8 %
Neutro Abs: 3.1 10*3/uL (ref 1.7–7.7)
Neutrophils Relative %: 57 %
Platelets: 219 10*3/uL (ref 150–400)
RBC: 4.59 MIL/uL (ref 3.87–5.11)
RDW: 15.9 % — ABNORMAL HIGH (ref 11.5–15.5)
WBC: 5.3 10*3/uL (ref 4.0–10.5)
nRBC: 0 % (ref 0.0–0.2)

## 2020-11-11 LAB — FERRITIN: Ferritin: 5 ng/mL — ABNORMAL LOW (ref 11–307)

## 2020-11-11 LAB — IRON AND TIBC
Iron: 32 ug/dL (ref 28–170)
Saturation Ratios: 7 % — ABNORMAL LOW (ref 10.4–31.8)
TIBC: 467 ug/dL — ABNORMAL HIGH (ref 250–450)
UIBC: 435 ug/dL
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
## 2020-11-13 ENCOUNTER — Other Ambulatory Visit: Payer: Self-pay

## 2020-11-13 ENCOUNTER — Inpatient Hospital Stay (HOSPITAL_BASED_OUTPATIENT_CLINIC_OR_DEPARTMENT_OTHER): Payer: Medicare Other | Admitting: Physician Assistant

## 2020-11-13 VITALS — BP 113/76 | HR 63 | Temp 97.0°F | Resp 18 | Wt 198.7 lb

## 2020-11-13 DIAGNOSIS — D509 Iron deficiency anemia, unspecified: Secondary | ICD-10-CM

## 2020-11-13 DIAGNOSIS — E611 Iron deficiency: Secondary | ICD-10-CM | POA: Diagnosis not present

## 2020-11-13 NOTE — Patient Instructions (Signed)
[place] at [facility] Discharge Instructions  You were seen today by Tarri Abernethy PA-C for your iron deficiency anemia and history of pulmonary embolisms.  Your iron levels remain low despite your oral iron supplementation.  This is most likely due to malabsorption from your history of gastric bypass surgery.  At this point, you can stop taking the iron pills since they do not seem to be improving your levels at all.  Instead, we will schedule you for IV iron supplementation.  As previously discussed, we recommend that you continue Xarelto indefinitely due to high risk of recurrent pulmonary embolism.  However, if you experience any significant bleeding events, especially signs of gastrointestinal blood loss such as bright red blood in the toilet or black tarry stools, please seek immediate medical attention, as this may be a reason to discontinue Xarelto.  LABS: Return in 4 months for repeat labs  OTHER TESTS: None at this time  MEDICATIONS: Stop taking oral iron.  No other changes to home medications.  FOLLOW-UP APPOINTMENT: Phone visit in 4 months to discuss lab results   Thank you for choosing [place] at [facility] to provide your oncology and hematology care.  To afford each patient quality time with our provider, please arrive at least 15 minutes before your scheduled appointment time.   If you have a lab appointment with the [facility] please come in thru the Main Entrance and check in at the main information desk.  You need to re-schedule your appointment should you arrive 10 or more minutes late.  We strive to give you quality time with our providers, and arriving late affects you and other patients whose appointments are after yours.  Also, if you no show three or more times for appointments you may be dismissed from the clinic at the providers discretion.     Again, thank you for choosing [facility].  Our  hope is that these requests will decrease the amount of time that you wait before being seen by our physicians.       _____________________________________________________________  Should you have questions after your visit to [facility], please contact our office at [phone] and follow the prompts.  Our office hours are 8:00 a.m. and 4:30 p.m. Monday - Friday.  Please note that voicemails left after 4:00 p.m. may not be returned until the following business day.  We are closed weekends and major holidays.  You do have access to a nurse 24-7, just call the main number to the clinic [phone] and do not press any options, hold on the line and a nurse will answer the phone.    For prescription refill requests, have your pharmacy contact our office and allow 72 hours.    Due to Covid, you will need to wear a mask upon entering the hospital. If you do not have a mask, a mask will be given to you at the Main Entrance upon arrival. For doctor visits, patients may have 1 support person age 83 or older with them. For treatment visits, patients can not have anyone with them due to social distancing guidelines and our immunocompromised population.

## 2020-11-21 ENCOUNTER — Encounter (HOSPITAL_COMMUNITY): Payer: Self-pay

## 2020-11-21 ENCOUNTER — Inpatient Hospital Stay (HOSPITAL_COMMUNITY): Payer: Medicare Other

## 2020-11-21 VITALS — BP 96/62 | HR 64 | Temp 98.2°F | Resp 18

## 2020-11-21 DIAGNOSIS — D509 Iron deficiency anemia, unspecified: Secondary | ICD-10-CM

## 2020-11-21 DIAGNOSIS — E611 Iron deficiency: Secondary | ICD-10-CM | POA: Diagnosis not present

## 2020-11-21 MED ORDER — ACETAMINOPHEN 325 MG PO TABS
650.0000 mg | ORAL_TABLET | Freq: Once | ORAL | Status: AC
  Administered 2020-11-21: 650 mg via ORAL
  Filled 2020-11-21: qty 2

## 2020-11-21 MED ORDER — FAMOTIDINE 20 MG IN NS 100 ML IVPB
20.0000 mg | Freq: Once | INTRAVENOUS | Status: DC
  Filled 2020-11-21: qty 100

## 2020-11-21 MED ORDER — LORATADINE 10 MG PO TABS
10.0000 mg | ORAL_TABLET | Freq: Once | ORAL | Status: AC
  Administered 2020-11-21: 10 mg via ORAL
  Filled 2020-11-21: qty 1

## 2020-11-21 MED ORDER — FAMOTIDINE 20 MG PO TABS
20.0000 mg | ORAL_TABLET | Freq: Once | ORAL | Status: AC
  Administered 2020-11-21: 20 mg via ORAL
  Filled 2020-11-21: qty 1

## 2020-11-21 MED ORDER — SODIUM CHLORIDE 0.9 % IV SOLN: Freq: Once | INTRAVENOUS | Status: AC

## 2020-11-21 MED ORDER — SODIUM CHLORIDE 0.9 % IV SOLN
510.0000 mg | Freq: Once | INTRAVENOUS | Status: AC
  Administered 2020-11-21: 510 mg via INTRAVENOUS
  Filled 2020-11-21: qty 510

## 2020-11-21 NOTE — Patient Instructions (Signed)
[place]  Discharge Instructions: Thank you for choosing [facility] to provide your oncology and hematology care.  If you have a lab appointment with the [facility], please come in thru the Main Entrance and check in at the main information desk.  Wear comfortable clothing and clothing appropriate for easy access to any Portacath or PICC line.   We strive to give you quality time with your provider. You may need to reschedule your appointment if you arrive late (15 or more minutes).  Arriving late affects you and other patients whose appointments are after yours.  Also, if you miss three or more appointments without notifying the office, you may be dismissed from the clinic at the provider's discretion.      For prescription refill requests, have your pharmacy contact our office and allow 72 hours for refills to be completed.    Today you received the following Venofer. Return as scheduled.   To help prevent nausea and vomiting after your treatment, we encourage you to take your nausea medication as directed.  BELOW ARE SYMPTOMS THAT SHOULD BE REPORTED IMMEDIATELY: *FEVER GREATER THAN 100.4 F (38 C) OR HIGHER *CHILLS OR SWEATING *NAUSEA AND VOMITING THAT IS NOT CONTROLLED WITH YOUR NAUSEA MEDICATION *UNUSUAL SHORTNESS OF BREATH *UNUSUAL BRUISING OR BLEEDING *URINARY PROBLEMS (pain or burning when urinating, or frequent urination) *BOWEL PROBLEMS (unusual diarrhea, constipation, pain near the anus) TENDERNESS IN MOUTH AND THROAT WITH OR WITHOUT PRESENCE OF ULCERS (sore throat, sores in mouth, or a toothache) UNUSUAL RASH, SWELLING OR PAIN  UNUSUAL VAGINAL DISCHARGE OR ITCHING   Items with * indicate a potential emergency and should be followed up as soon as possible or go to the Emergency Department if any problems should occur.  Please show the CHEMOTHERAPY ALERT CARD or IMMUNOTHERAPY ALERT CARD at check-in to the Emergency Department and triage  nurse.  Should you have questions after your visit or need to cancel or reschedule your appointment, please contact [facility] [phone]  and follow the prompts.  Office hours are 8:00 a.m. to 4:30 p.m. Monday - Friday. Please note that voicemails left after 4:00 p.m. may not be returned until the following business day.  We are closed weekends and major holidays. You have access to a nurse at all times for urgent questions. Please call the main number to the clinic [phone] and follow the prompts.  For any non-urgent questions, you may also contact your provider using MyChart. We now offer e-Visits for anyone 9 and older to request care online for non-urgent symptoms. For details visit [email or web address].   Also download the MyChart app! Go to the app store, search "MyChart", open the app, select [facility], and log in with your MyChart username and password.  Due to Covid, a mask is required upon entering the hospital/clinic. If you do not have a mask, one will be given to you upon arrival. For doctor visits, patients may have 1 support person aged 43 or older with them. For treatment visits, patients cannot have anyone with them due to current Covid guidelines and our immunocompromised population.

## 2020-11-21 NOTE — Progress Notes (Signed)
Patient tolerated iron infusion with no complaints voiced.  Peripheral IV site clean and dry with good blood return noted before and after infusion.  Band aid applied.  VSS with discharge and left in satisfactory condition with no s/s of distress noted.   

## 2020-11-27 MED FILL — Ferumoxytol Inj 510 MG/17ML (30 MG/ML) (Elemental Fe): INTRAVENOUS | Qty: 17 | Status: AC

## 2020-11-28 ENCOUNTER — Inpatient Hospital Stay (HOSPITAL_COMMUNITY): Payer: Medicare Other

## 2020-11-28 ENCOUNTER — Encounter (HOSPITAL_COMMUNITY): Payer: Self-pay

## 2020-11-28 ENCOUNTER — Other Ambulatory Visit: Payer: Self-pay

## 2020-11-28 VITALS — BP 102/57 | HR 57 | Temp 97.0°F | Resp 17

## 2020-11-28 DIAGNOSIS — E611 Iron deficiency: Secondary | ICD-10-CM | POA: Diagnosis not present

## 2020-11-28 DIAGNOSIS — D509 Iron deficiency anemia, unspecified: Secondary | ICD-10-CM

## 2020-11-28 MED ORDER — ACETAMINOPHEN 325 MG PO TABS
650.0000 mg | ORAL_TABLET | Freq: Once | ORAL | Status: AC
  Administered 2020-11-28: 650 mg via ORAL
  Filled 2020-11-28: qty 2

## 2020-11-28 MED ORDER — FAMOTIDINE 20 MG PO TABS
20.0000 mg | ORAL_TABLET | Freq: Once | ORAL | Status: AC
  Administered 2020-11-28: 20 mg via ORAL
  Filled 2020-11-28: qty 1

## 2020-11-28 MED ORDER — SODIUM CHLORIDE 0.9 % IV SOLN
510.0000 mg | Freq: Once | INTRAVENOUS | Status: AC
  Administered 2020-11-28: 510 mg via INTRAVENOUS
  Filled 2020-11-28: qty 510

## 2020-11-28 MED ORDER — SODIUM CHLORIDE 0.9 % IV SOLN: Freq: Once | INTRAVENOUS | Status: AC

## 2020-11-28 MED ORDER — LORATADINE 10 MG PO TABS
10.0000 mg | ORAL_TABLET | Freq: Once | ORAL | Status: AC
  Administered 2020-11-28: 10 mg via ORAL
  Filled 2020-11-28: qty 1

## 2020-11-28 NOTE — Patient Instructions (Signed)
[facility]  Discharge Instructions: ?Thank you for choosing [facility] to provide your oncology and hematology care.  ?If you have a lab appointment with the Cancer Center, please come in thru the Main Entrance and check in at the main information desk. ? ?Wear comfortable clothing and clothing appropriate for easy access to any Portacath or PICC line.  ? ?We strive to give you quality time with your provider. You may need to reschedule your appointment if you arrive late (15 or more minutes).  Arriving late affects you and other patients whose appointments are after yours.  Also, if you miss three or more appointments without notifying the office, you may be dismissed from the clinic at the provider?s discretion.    ?  ?For prescription refill requests, have your pharmacy contact our office and allow 72 hours for refills to be completed.   ? ?Today you received the following Feraheme, return as scheduled. ?  ?To help prevent nausea and vomiting after your treatment, we encourage you to take your nausea medication as directed. ? ?BELOW ARE SYMPTOMS THAT SHOULD BE REPORTED IMMEDIATELY: ?*FEVER GREATER THAN 100.4 F (38 ?C) OR HIGHER ?*CHILLS OR SWEATING ?*NAUSEA AND VOMITING THAT IS NOT CONTROLLED WITH YOUR NAUSEA MEDICATION ?*UNUSUAL SHORTNESS OF BREATH ?*UNUSUAL BRUISING OR BLEEDING ?*URINARY PROBLEMS (pain or burning when urinating, or frequent urination) ?*BOWEL PROBLEMS (unusual diarrhea, constipation, pain near the anus) ?TENDERNESS IN MOUTH AND THROAT WITH OR WITHOUT PRESENCE OF ULCERS (sore throat, sores in mouth, or a toothache) ?UNUSUAL RASH, SWELLING OR PAIN  ?UNUSUAL VAGINAL DISCHARGE OR ITCHING  ? ?Items with * indicate a potential emergency and should be followed up as soon as possible or go to the Emergency Department if any problems should occur. ? ?Please show the CHEMOTHERAPY ALERT CARD or IMMUNOTHERAPY ALERT CARD at check-in to the Emergency Department and triage  nurse. ? ?Should you have questions after your visit or need to cancel or reschedule your appointment, please contact [facility] [phone]  and follow the prompts.  Office hours are 8:00 a.m. to 4:30 p.m. Monday - Friday. Please note that voicemails left after 4:00 p.m. may not be returned until the following business day.  We are closed weekends and major holidays. You have access to a nurse at all times for urgent questions. Please call the main number to the clinic [phone] and follow the prompts. ? ?For any non-urgent questions, you may also contact your provider using MyChart. We now offer e-Visits for anyone 18 and older to request care online for non-urgent symptoms. For details visit [email or web address]. ?  ?Also download the MyChart app! Go to the app store, search "MyChart", open the app, select [facility], and log in with your MyChart username and password. ? ?Due to Covid, a mask is required upon entering the hospital/clinic. If you do not have a mask, one will be given to you upon arrival. For doctor visits, patients may have 1 support person aged 18 or older with them. For treatment visits, patients cannot have anyone with them due to current Covid guidelines and our immunocompromised population.  ?

## 2020-11-28 NOTE — Progress Notes (Signed)
Patient tolerated iron infusion with no complaints voiced.  Peripheral IV site clean and dry with good blood return noted before and after infusion.  Band aid applied.  VSS with discharge and left in satisfactory condition with no s/s of distress noted.   

## 2020-12-12 ENCOUNTER — Ambulatory Visit (INDEPENDENT_AMBULATORY_CARE_PROVIDER_SITE_OTHER): Payer: Medicare Other | Admitting: Family Medicine

## 2020-12-12 ENCOUNTER — Encounter: Payer: Self-pay | Admitting: Family Medicine

## 2020-12-12 ENCOUNTER — Ambulatory Visit (INDEPENDENT_AMBULATORY_CARE_PROVIDER_SITE_OTHER): Payer: Medicare Other

## 2020-12-12 ENCOUNTER — Other Ambulatory Visit: Payer: Self-pay

## 2020-12-12 VITALS — BP 101/68 | HR 87 | Temp 97.0°F | Ht 65.0 in | Wt 190.0 lb

## 2020-12-12 DIAGNOSIS — R109 Unspecified abdominal pain: Secondary | ICD-10-CM

## 2020-12-12 LAB — URINALYSIS, ROUTINE W REFLEX MICROSCOPIC
Bilirubin, UA: NEGATIVE
Glucose, UA: NEGATIVE
Ketones, UA: NEGATIVE
Leukocytes,UA: NEGATIVE
Nitrite, UA: NEGATIVE
Protein,UA: NEGATIVE
RBC, UA: NEGATIVE
Specific Gravity, UA: 1.025 (ref 1.005–1.030)
Urobilinogen, Ur: 1 mg/dL (ref 0.2–1.0)
pH, UA: 5.5 (ref 5.0–7.5)

## 2020-12-12 NOTE — Progress Notes (Signed)
Assessment & Plan:  1. Left flank pain Continue Tylenol for pain relief. Will prescribe Flomax if KUB shows a stone. Education provided on kidney stones. Patient signed a record request for previous urologist (out of state).  - Urinalysis, Routine w reflex microscopic - Urine dipstick shows negative for all components.  Micro exam: negative for WBC's or RBC's. - DG Abd 1 View   Follow up plan: Return if symptoms worsen or fail to improve.  Hendricks Limes, MSN, APRN, FNP-C [facility]  Subjective:   Patient ID: Stephanie Dyer, female    DOB: Aug 15, 1975, 45 y.o.   MRN: [id number]  HPI: Stephanie Dyer is a 45 y.o. female presenting on 12/12/2020 for Pain (Patient states that she has been having left kidney pain x 3 days )  Patient reports left flank pain x3 days. Describes the pain as dull and aching. Also experiencing nausea and vomiting. History of kidney stones; requiring lithotripsy in the past. Currently managing pain with Tylenol. Use Flomax in the past.    ROS: Negative unless specifically indicated above in HPI.   Relevant past medical history reviewed and updated as indicated.   Allergies and medications reviewed and updated.   Current Outpatient Medications:    albuterol (VENTOLIN HFA) 108 (90 Base) MCG/ACT inhaler, Inhale 2 puffs into the lungs every 6 (six) hours as needed., Disp: 18 g, Rfl: 4   EPINEPHrine 0.3 mg/0.3 mL IJ SOAJ injection, Inject 0.3 mg into the muscle as needed., Disp: , Rfl:    FLUoxetine (PROZAC) 20 MG capsule, Take 1 capsule (20 mg total) by mouth daily., Disp: 30 capsule, Rfl: 2   pantoprazole (PROTONIX) 40 MG tablet, Take 1 tablet by mouth once daily, Disp: 90 tablet, Rfl: 0   rivaroxaban (XARELTO) 20 MG TABS tablet, Take 1 tablet (20 mg total) by mouth daily with supper., Disp: 90 tablet, Rfl: 3   topiramate (TOPAMAX) 50 MG tablet, Take 75 mg by mouth daily., Disp: , Rfl:   Current Facility-Administered Medications:     methylPREDNISolone acetate (DEPO-MEDROL) injection 40 mg, 40 mg, Intramuscular, Once, Gottschalk, Ashly M, DO  Allergies  Allergen Reactions   Codeine Anaphylaxis    Other reaction(s): Projectile vomiting, throat closes   Morphine Anaphylaxis    Other reaction(s): Projectile vomiting, throat clos   Other Anaphylaxis    peanuts   Fioricet [Butalbital-Apap-Caffeine] Other (See Comments)    Headache   Nsaids Other (See Comments)    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   Shellfish Allergy Swelling   Vancomycin Itching and Other (See Comments)    Itching & flushed skin.   Venlafaxine Other (See Comments)    GI upset   Zolpidem Other (See Comments)    Ambien-Parasomnia   Latex Rash    Objective:   BP 101/68   Pulse 87   Temp (!) 97 F (36.1 C) (Temporal)   Ht 5\' 5"  (1.651 m)   Wt 190 lb (86.2 kg)   LMP 11/04/2018   SpO2 97%   BMI 31.62 kg/m    Physical Exam Vitals reviewed.  Constitutional:      General: She is not in acute distress.    Appearance: Normal appearance. She is obese. She is not ill-appearing, toxic-appearing or diaphoretic.  HENT:     Head: Normocephalic and atraumatic.  Eyes:     General: No scleral icterus.       Right eye: No discharge.        Left eye: No discharge.  Conjunctiva/sclera: Conjunctivae normal.  Cardiovascular:     Rate and Rhythm: Normal rate.  Pulmonary:     Effort: Pulmonary effort is normal. No respiratory distress.  Abdominal:     Tenderness: There is left CVA tenderness. There is no right CVA tenderness.  Musculoskeletal:        General: Normal range of motion.     Cervical back: Normal range of motion.  Skin:    General: Skin is warm and dry.     Capillary Refill: Capillary refill takes less than 2 seconds.  Neurological:     General: No focal deficit present.     Mental Status: She is alert and oriented to person, place, and time. Mental status is at baseline.  Psychiatric:        Mood and Affect: Mood normal.         Behavior: Behavior normal.        Thought Content: Thought content normal.        Judgment: Judgment normal.

## 2021-01-05 ENCOUNTER — Encounter: Payer: Self-pay | Admitting: Family Medicine

## 2021-01-05 ENCOUNTER — Ambulatory Visit (INDEPENDENT_AMBULATORY_CARE_PROVIDER_SITE_OTHER): Payer: Medicare Other | Admitting: Family Medicine

## 2021-01-05 DIAGNOSIS — R399 Unspecified symptoms and signs involving the genitourinary system: Secondary | ICD-10-CM

## 2021-01-05 MED ORDER — FLUCONAZOLE 150 MG PO TABS: 150.0000 mg | ORAL_TABLET | Freq: Once | ORAL | 0 refills | Status: AC

## 2021-01-05 MED ORDER — CEPHALEXIN 500 MG PO CAPS: 500.0000 mg | ORAL_CAPSULE | Freq: Two times a day (BID) | ORAL | 0 refills | Status: AC

## 2021-01-05 NOTE — Progress Notes (Signed)
   Virtual Visit  Note Due to COVID-19 pandemic this visit was conducted virtually. This visit type was conducted due to national recommendations for restrictions regarding the COVID-19 Pandemic (e.g. social distancing, sheltering in place) in an effort to limit this patient's exposure and mitigate transmission in our community. All issues noted in this document were discussed and addressed.  A physical exam was not performed with this format.  I connected with Stephanie Dyer on 01/05/21 at 1244 by telephone and verified that I am speaking with the correct person using two identifiers. Stephanie Dyer is currently located in her car and no one is currently with her during the visit. The provider, Gwenlyn Perking, FNP is located in their office at time of visit.  I discussed the limitations, risks, security and privacy concerns of performing an evaluation and management service by telephone and the availability of in person appointments. I also discussed with the patient that there may be a patient responsible charge related to this service. The patient expressed understanding and agreed to proceed.  CC: UTI symptoms  History and Present Illness:  HPI Ladrea reports UTI symptoms that started 1 day ago. She reports dysuria, frequency, urgency, and lower back ache. She denies fever, chills, hematuria, nausea or vomiting. She is working out of town today and is not able to leave urine for testing.     ROS As per HPI.   Observations/Objective: Alert and oriented x 3. Able to speak in full sentences without difficulty.   Assessment and Plan: [name] was seen today for urinary tract infection.  Diagnoses and all orders for this visit:  UTI symptoms Keflex as below. Diflucan ordered as patient reports yeast infections with abx use.  -     cephALEXin (KEFLEX) 500 MG capsule; Take 1 capsule (500 mg total) by mouth 2 (two) times daily for 7 days. -     fluconazole (DIFLUCAN) 150 MG tablet; Take  1 tablet (150 mg total) by mouth once for 1 dose.    Follow Up Instructions: Return to office for new or worsening symptoms, or if symptoms persist.     I discussed the assessment and treatment plan with the patient. The patient was provided an opportunity to ask questions and all were answered. The patient agreed with the plan and demonstrated an understanding of the instructions.   The patient was advised to call back or seek an in-person evaluation if the symptoms worsen or if the condition fails to improve as anticipated.  The above assessment and management plan was discussed with the patient. The patient verbalized understanding of and has agreed to the management plan. Patient is aware to call the clinic if symptoms persist or worsen. Patient is aware when to return to the clinic for a follow-up visit. Patient educated on when it is appropriate to go to the emergency department.   Time call ended:  1255  I provided 11 minutes of  non face-to-face time during this encounter.    Gwenlyn Perking, FNP

## 2021-01-14 ENCOUNTER — Encounter: Payer: Self-pay | Admitting: Family Medicine

## 2021-02-10 ENCOUNTER — Telehealth: Payer: Self-pay

## 2021-02-10 ENCOUNTER — Other Ambulatory Visit: Payer: Self-pay | Admitting: Nurse Practitioner

## 2021-02-10 ENCOUNTER — Telehealth: Payer: Self-pay | Admitting: Family Medicine

## 2021-02-10 DIAGNOSIS — J101 Influenza due to other identified influenza virus with other respiratory manifestations: Secondary | ICD-10-CM

## 2021-02-10 MED ORDER — OSELTAMIVIR PHOSPHATE 75 MG PO CAPS: 75.0000 mg | ORAL_CAPSULE | Freq: Every day | ORAL | 0 refills | Status: DC

## 2021-02-10 NOTE — Telephone Encounter (Signed)
Pt wants to verify with PCP if it is safe for pt to fly on plane with P.E.

## 2021-02-10 NOTE — Telephone Encounter (Signed)
Son is positive for flu

## 2021-02-11 NOTE — Telephone Encounter (Signed)
Patient notified and verbalized understanding. 

## 2021-02-11 NOTE — Telephone Encounter (Signed)
She is 6 months out and on a blood thinner, I think flying should be fine.

## 2021-02-19 ENCOUNTER — Ambulatory Visit (INDEPENDENT_AMBULATORY_CARE_PROVIDER_SITE_OTHER): Payer: Medicare Other | Admitting: Family Medicine

## 2021-02-19 ENCOUNTER — Encounter: Payer: Self-pay | Admitting: Family Medicine

## 2021-02-19 VITALS — BP 109/65 | HR 73 | Temp 98.2°F | Ht 65.0 in | Wt 192.2 lb

## 2021-02-19 DIAGNOSIS — J209 Acute bronchitis, unspecified: Secondary | ICD-10-CM

## 2021-02-19 DIAGNOSIS — R051 Acute cough: Secondary | ICD-10-CM | POA: Diagnosis not present

## 2021-02-19 DIAGNOSIS — J45909 Unspecified asthma, uncomplicated: Secondary | ICD-10-CM | POA: Diagnosis not present

## 2021-02-19 LAB — VERITOR FLU A/B WAIVED
Influenza A: NEGATIVE
Influenza B: NEGATIVE

## 2021-02-19 MED ORDER — METHYLPREDNISOLONE 4 MG PO TBPK: ORAL_TABLET | ORAL | 0 refills | Status: DC

## 2021-02-19 NOTE — Progress Notes (Signed)
Assessment & Plan:  1. Acute bronchitis with asthma Education provided on acute bronchitis. - methylPREDNISolone (MEDROL DOSEPAK) 4 MG TBPK tablet; Use as directed.  Dispense: 21 each; Refill: 0  2. Acute cough - Veritor Flu A/B Waived (negative)   Follow up plan: Return in about 3 months (around 05/20/2021), or if symptoms worsen or fail to improve, for annual physical.  Hendricks Limes, MSN, APRN, FNP-C [facility]  Subjective:   Patient ID: Stephanie Dyer, female    DOB: June 29, 1975, 45 y.o.   MRN: [id number]  HPI: Stephanie Dyer is a 45 y.o. female presenting on 02/19/2021 for Cough (X 2 days and has gotten worse. Patient states she has a moldy taste in her mouth that started this morning. )  Patient complains of cough and a moldy taste in her mouth . Cough is productive at time. Onset of cough was 2 days ago, with onset of abnormal taste this morning. Denies any upper respiratory symptoms, wheezing, and shortness of breath. She is drinking plenty of fluids. Evaluation to date: at home COVID test negative. She has a history of asthma. She does not smoke. Patient has not been fully vaccinated against COVID-19. She was prescribed Tamiflu prophylactically due to her son testing positive for influenza. However, she has only been taking it once daily instead of twice daily as she has one more tablet left.     ROS: Negative unless specifically indicated above in HPI.   Relevant past medical history reviewed and updated as indicated.   Allergies and medications reviewed and updated.   Current Outpatient Medications:    albuterol (VENTOLIN HFA) 108 (90 Base) MCG/ACT inhaler, Inhale 2 puffs into the lungs every 6 (six) hours as needed., Disp: 18 g, Rfl: 4   EPINEPHrine 0.3 mg/0.3 mL IJ SOAJ injection, Inject 0.3 mg into the muscle as needed., Disp: , Rfl:    FLUoxetine (PROZAC) 20 MG capsule, Take 1 capsule (20 mg total) by mouth daily., Disp: 30  capsule, Rfl: 2   oseltamivir (TAMIFLU) 75 MG capsule, Take 1 capsule (75 mg total) by mouth daily., Disp: 10 capsule, Rfl: 0   pantoprazole (PROTONIX) 40 MG tablet, Take 1 tablet by mouth once daily, Disp: 90 tablet, Rfl: 0   rivaroxaban (XARELTO) 20 MG TABS tablet, Take 1 tablet (20 mg total) by mouth daily with supper., Disp: 90 tablet, Rfl: 3   topiramate (TOPAMAX) 50 MG tablet, Take 75 mg by mouth daily. 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   Shellfish Allergy Swelling   Vancomycin Itching and Other (See Comments)    Itching & flushed skin.   Venlafaxine Other (See Comments)    GI upset   Zolpidem Other (See Comments)    Ambien-Parasomnia   Latex Rash    Objective:   BP 109/65    Pulse 73    Temp 98.2 F (36.8 C) (Temporal)    Ht 5\' 5"  (1.651 m)    Wt 192 lb  3.2 oz (87.2 kg)    LMP 11/04/2018    SpO2 96%    BMI 31.98 kg/m    Physical Exam Vitals reviewed.  Constitutional:      General: She is not in acute distress.    Appearance: Normal appearance. She is not ill-appearing, toxic-appearing or diaphoretic.  HENT:     Head: Normocephalic and atraumatic.  Eyes:     General: No scleral icterus.       Right eye: No discharge.        Left eye: No discharge.     Conjunctiva/sclera: Conjunctivae normal.  Cardiovascular:     Rate and Rhythm: Normal rate and regular rhythm.     Heart sounds: Normal heart sounds. No murmur heard.   No friction  rub. No gallop.  Pulmonary:     Effort: Pulmonary effort is normal. No respiratory distress.     Breath sounds: Normal breath sounds. No stridor. No wheezing, rhonchi or rales.  Musculoskeletal:        General: Normal range of motion.     Cervical back: Normal range of motion.  Skin:    General: Skin is warm and dry.     Capillary Refill: Capillary refill takes less than 2 seconds.  Neurological:     General: No focal deficit present.     Mental Status: She is alert and oriented to person, place, and time. Mental status is at baseline.  Psychiatric:        Mood and Affect: Mood normal.        Behavior: Behavior normal.        Thought Content: Thought content normal.        Judgment: Judgment normal.

## 2021-02-20 ENCOUNTER — Other Ambulatory Visit: Payer: Self-pay | Admitting: Family Medicine

## 2021-02-20 ENCOUNTER — Telehealth: Payer: Self-pay | Admitting: Family Medicine

## 2021-02-20 DIAGNOSIS — Z9109 Other allergy status, other than to drugs and biological substances: Secondary | ICD-10-CM

## 2021-02-20 DIAGNOSIS — J209 Acute bronchitis, unspecified: Secondary | ICD-10-CM

## 2021-02-20 DIAGNOSIS — G43109 Migraine with aura, not intractable, without status migrainosus: Secondary | ICD-10-CM

## 2021-02-20 MED ORDER — PANTOPRAZOLE SODIUM 40 MG PO TBEC: 40.0000 mg | DELAYED_RELEASE_TABLET | Freq: Every day | ORAL | 1 refills | Status: DC

## 2021-02-20 MED ORDER — BENZONATATE 100 MG PO CAPS
200.0000 mg | ORAL_CAPSULE | Freq: Three times a day (TID) | ORAL | 0 refills | Status: DC | PRN
Start: 2021-02-20 — End: 2021-02-26

## 2021-02-20 MED ORDER — TOPIRAMATE 50 MG PO TABS: 75.0000 mg | ORAL_TABLET | Freq: Every day | ORAL | 1 refills | Status: DC

## 2021-02-20 MED ORDER — EPINEPHRINE 0.3 MG/0.3ML IJ SOAJ: 0.3000 mg | INTRAMUSCULAR | 2 refills | Status: DC | PRN

## 2021-02-20 NOTE — Telephone Encounter (Signed)
She has a viral illness; we do sometimes see viral pink eye. No antibiotic ointment/drops needed at this time.  I sent tessalon perles for the cough.

## 2021-02-20 NOTE — Telephone Encounter (Signed)
Patient aware and verbalizes understanding. 

## 2021-02-26 ENCOUNTER — Ambulatory Visit (INDEPENDENT_AMBULATORY_CARE_PROVIDER_SITE_OTHER): Payer: Medicare Other | Admitting: Family Medicine

## 2021-02-26 ENCOUNTER — Encounter: Payer: Self-pay | Admitting: Family Medicine

## 2021-02-26 ENCOUNTER — Telehealth: Payer: Self-pay | Admitting: Family Medicine

## 2021-02-26 VITALS — BP 106/74 | HR 66 | Temp 97.8°F | Ht 65.0 in | Wt 190.0 lb

## 2021-02-26 DIAGNOSIS — H60331 Swimmer's ear, right ear: Secondary | ICD-10-CM | POA: Diagnosis not present

## 2021-02-26 DIAGNOSIS — B9689 Other specified bacterial agents as the cause of diseases classified elsewhere: Secondary | ICD-10-CM

## 2021-02-26 DIAGNOSIS — J22 Unspecified acute lower respiratory infection: Secondary | ICD-10-CM

## 2021-02-26 MED ORDER — CIPROFLOXACIN-DEXAMETHASONE 0.3-0.1 % OT SUSP: 4.0000 [drp] | Freq: Two times a day (BID) | OTIC | 0 refills | Status: DC

## 2021-02-26 MED ORDER — CIPROFLOXACIN-HYDROCORTISONE 0.2-1 % OT SUSP: 3.0000 [drp] | Freq: Two times a day (BID) | OTIC | 0 refills | Status: DC

## 2021-02-26 MED ORDER — NEOMYCIN-POLYMYXIN-HC 3.5-10000-1 OT SOLN: 4.0000 [drp] | Freq: Four times a day (QID) | OTIC | 0 refills | Status: AC

## 2021-02-26 NOTE — Progress Notes (Signed)
Assessment & Plan:  1. Bacterial lower respiratory infection - continue antibiotics and steroids as previously prescribed - continue using albuterol prn for wheezing and chest tightness  2. Acute swimmer's ear of right side - ciprofloxacin-dexamethasone (CIPRODEX) OTIC suspension; Place 4 drops into the right ear 2 (two) times daily for 7 days.  Dispense: 7.5 mL; Refill: 0   Follow up plan: Return if symptoms worsen or fail to improve.  Lucile Crater, NP Student  I personally was present during the history, physical exam, and medical decision-making activities of this service and have verified that the service and findings are accurately documented in the nurse practitioner student's note.  Hendricks Limes, MSN, APRN, FNP-C [facility]  Subjective:   Patient ID: Stephanie Dyer, female    DOB: 1975-04-24, 45 y.o.   MRN: [id number]  HPI: Stephanie Dyer is a 45 y.o. female presenting on 02/26/2021 for Cough (Worse from visit on 12/15) and Ear Pain (Right x 1 day- feels like fluid is in it )  This patient presents today with worsening cough from visit 12/15 where she was diagnosed with acute bronchitis and given a course of steroids. She states this has not improved despite starting steroids and taking delsym for the cough. Since this visit she has also been to an urgent care 12/17 who diagnosed her with conjunctivitis and gave her a course of Polytrim eye drops and Keflex. She states she also went to an ophthalmologist who confirmed the conjunctivitis and also a clogged tear ducts. Today she has significant right ear pain and states she hears fluid moving around in her ear.    ROS: Negative unless specifically indicated above in HPI.   Relevant past medical history reviewed and updated as indicated.   Allergies and medications reviewed and updated.   Current Outpatient Medications:    albuterol (VENTOLIN HFA) 108 (90 Base) MCG/ACT inhaler,  Inhale 2 puffs into the lungs every 6 (six) hours as needed., Disp: 18 g, Rfl: 4   cephALEXin (KEFLEX) 250 MG capsule, Take by mouth 4 (four) times daily., Disp: , Rfl:    ciprofloxacin-dexamethasone (CIPRODEX) OTIC suspension, Place 4 drops into the right ear 2 (two) times daily for 7 days., Disp: 7.5 mL, Rfl: 0   dextromethorphan (DELSYM) 30 MG/5ML liquid, Take by mouth., Disp: , Rfl:    EPINEPHrine 0.3 mg/0.3 mL IJ SOAJ injection, Inject 0.3 mg into the muscle as needed., Disp: 2 each, Rfl: 2   FLUoxetine (PROZAC) 20 MG capsule, Take 1 capsule (20 mg total) by mouth daily., Disp: 30 capsule, Rfl: 2   methylPREDNISolone (MEDROL DOSEPAK) 4 MG TBPK tablet, Use as directed., Disp: 21 each, Rfl: 0   pantoprazole (PROTONIX) 40 MG tablet, Take 1 tablet (40 mg total) by mouth daily., Disp: 90 tablet, Rfl: 1   rivaroxaban (XARELTO) 20 MG TABS tablet, Take 1 tablet (20 mg total) by mouth daily with supper., Disp: 90 tablet, Rfl: 3   topiramate (TOPAMAX) 50 MG tablet, Take 1.5 tablets (75 mg total) by mouth daily., Disp: 135 tablet, Rfl: 1   trimethoprim-polymyxin b (POLYTRIM) ophthalmic solution, Place 1 drop into both eyes 6 times daily for 10 days., Disp: , Rfl:   Allergies  Allergen Reactions   Codeine Anaphylaxis and Other (See Comments)    Other reaction(s): Projectile vomiting, throat closes Headache   Morphine Anaphylaxis    Other reaction(s): Projectile vomiting, throat clos Other reaction(s): Projectile vomiting, throat clos   Other Anaphylaxis    peanuts  Fioricet [Butalbital-Apap-Caffeine] Other (See Comments)    Headache   Nsaids Other (See Comments) and Nausea And Vomiting    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   Shellfish Allergy Swelling   Vancomycin Itching and Other (See Comments)    Itching & flushed skin.   Venlafaxine Other (See Comments)    GI upset   Zolpidem Other (See Comments)    Ambien-Parasomnia   Latex Rash    Objective:   BP  106/74    Pulse 66    Temp 97.8 F (36.6 C) (Temporal)    Ht 5\' 5"  (1.651 m)    Wt 86.2 kg    LMP 11/04/2018    SpO2 97%    BMI 31.62 kg/m    Physical Exam Vitals reviewed.  Constitutional:      General: She is not in acute distress.    Appearance: Normal appearance. She is not ill-appearing, toxic-appearing or diaphoretic.  HENT:     Head: Normocephalic and atraumatic.     Right Ear: Hearing normal. Swelling and tenderness present. No drainage.     Left Ear: Tympanic membrane, ear canal and external ear normal. There is no impacted cerumen.     Nose: Nose normal.     Mouth/Throat:     Mouth: Mucous membranes are moist.     Pharynx: Oropharynx is clear.  Eyes:     Extraocular Movements: Extraocular movements intact.     Pupils: Pupils are equal, round, and reactive to light.  Cardiovascular:     Rate and Rhythm: Normal rate and regular rhythm.     Pulses: Normal pulses.     Heart sounds: Normal heart sounds.  Pulmonary:     Effort: Pulmonary effort is normal. No respiratory distress.     Breath sounds: Wheezing and rhonchi present.  Chest:     Chest wall: Tenderness present.  Abdominal:     General: There is no distension.     Palpations: Abdomen is soft. There is no mass.  Musculoskeletal:        General: Normal range of motion.     Cervical back: Normal range of motion.  Skin:    General: Skin is warm and dry.  Neurological:     General: No focal deficit present.     Mental Status: She is alert and oriented to person, place, and time.     Motor: No weakness.     Gait: Gait normal.  Psychiatric:        Mood and Affect: Mood normal.        Behavior: Behavior normal.        Thought Content: Thought content normal.        Judgment: Judgment normal.

## 2021-02-26 NOTE — Telephone Encounter (Signed)
New prescription sent

## 2021-02-26 NOTE — Telephone Encounter (Signed)
Pt aware new script sent to pharmacy

## 2021-02-26 NOTE — Telephone Encounter (Signed)
Patient aware.

## 2021-02-26 NOTE — Addendum Note (Signed)
Addended by: Loman Brooklyn on: 02/26/2021 04:02 PM   Modules accepted: Orders

## 2021-02-26 NOTE — Telephone Encounter (Signed)
Pt called back in, new ear gtts are not covered by her insurance Please advise on an alternative

## 2021-02-26 NOTE — Telephone Encounter (Signed)
New ear drop sent.

## 2021-03-05 ENCOUNTER — Telehealth: Payer: Self-pay | Admitting: Family Medicine

## 2021-03-05 DIAGNOSIS — F32A Depression, unspecified: Secondary | ICD-10-CM

## 2021-03-05 MED ORDER — FLUOXETINE HCL 20 MG PO CAPS: 20.0000 mg | ORAL_CAPSULE | Freq: Every day | ORAL | 0 refills | Status: DC

## 2021-03-05 NOTE — Telephone Encounter (Signed)
°  Prescription Request  03/05/2021  Is this a "Controlled Substance" medicine? NO  Have you seen your PCP in the last 2 weeks? NO  If YES, route message to pool  -  If NO, patient needs to be scheduled for appointment.  What is the name of the medication or equipment? Fluoxetine 20 mg  Have you contacted your pharmacy to request a refill? NO   Which pharmacy would you like this sent to? [facility]   Patient notified that their request is being sent to the clinical staff for review and that they should receive a response within 2 business days.

## 2021-03-05 NOTE — Telephone Encounter (Signed)
Left message to call back and schedule AWV

## 2021-03-05 NOTE — Telephone Encounter (Signed)
Refil sent to pharmacy. Appt made for 03/17/21

## 2021-03-10 ENCOUNTER — Encounter (HOSPITAL_COMMUNITY): Payer: Self-pay | Admitting: Hematology

## 2021-03-17 ENCOUNTER — Encounter: Payer: Self-pay | Admitting: Family Medicine

## 2021-03-17 ENCOUNTER — Other Ambulatory Visit: Payer: Self-pay

## 2021-03-17 ENCOUNTER — Ambulatory Visit (INDEPENDENT_AMBULATORY_CARE_PROVIDER_SITE_OTHER): Payer: Medicare Other | Admitting: Family Medicine

## 2021-03-17 ENCOUNTER — Inpatient Hospital Stay (HOSPITAL_COMMUNITY): Payer: Medicare Other | Attending: Hematology

## 2021-03-17 ENCOUNTER — Other Ambulatory Visit (HOSPITAL_COMMUNITY)
Admission: RE | Admit: 2021-03-17 | Discharge: 2021-03-17 | Disposition: A | Discharge status: Home / Self Care | Payer: Medicare Other | Source: Ambulatory Visit | Attending: Family Medicine | Admitting: Family Medicine

## 2021-03-17 VITALS — Wt 187.0 lb

## 2021-03-17 DIAGNOSIS — E611 Iron deficiency: Secondary | ICD-10-CM

## 2021-03-17 DIAGNOSIS — F603 Borderline personality disorder: Secondary | ICD-10-CM

## 2021-03-17 DIAGNOSIS — Z9109 Other allergy status, other than to drugs and biological substances: Secondary | ICD-10-CM | POA: Diagnosis not present

## 2021-03-17 DIAGNOSIS — F411 Generalized anxiety disorder: Secondary | ICD-10-CM | POA: Insufficient documentation

## 2021-03-17 DIAGNOSIS — Z6831 Body mass index (BMI) 31.0-31.9, adult: Secondary | ICD-10-CM | POA: Insufficient documentation

## 2021-03-17 DIAGNOSIS — M459 Ankylosing spondylitis of unspecified sites in spine: Secondary | ICD-10-CM | POA: Diagnosis not present

## 2021-03-17 DIAGNOSIS — E538 Deficiency of other specified B group vitamins: Secondary | ICD-10-CM

## 2021-03-17 DIAGNOSIS — G43109 Migraine with aura, not intractable, without status migrainosus: Secondary | ICD-10-CM

## 2021-03-17 DIAGNOSIS — E041 Nontoxic single thyroid nodule: Secondary | ICD-10-CM | POA: Diagnosis not present

## 2021-03-17 DIAGNOSIS — J4520 Mild intermittent asthma, uncomplicated: Secondary | ICD-10-CM | POA: Diagnosis not present

## 2021-03-17 DIAGNOSIS — Z79899 Other long term (current) drug therapy: Secondary | ICD-10-CM | POA: Insufficient documentation

## 2021-03-17 DIAGNOSIS — E669 Obesity, unspecified: Secondary | ICD-10-CM

## 2021-03-17 DIAGNOSIS — K219 Gastro-esophageal reflux disease without esophagitis: Secondary | ICD-10-CM

## 2021-03-17 DIAGNOSIS — Z86711 Personal history of pulmonary embolism: Secondary | ICD-10-CM

## 2021-03-17 DIAGNOSIS — E559 Vitamin D deficiency, unspecified: Secondary | ICD-10-CM

## 2021-03-17 DIAGNOSIS — H109 Unspecified conjunctivitis: Secondary | ICD-10-CM

## 2021-03-17 DIAGNOSIS — D509 Iron deficiency anemia, unspecified: Secondary | ICD-10-CM

## 2021-03-17 LAB — COMPREHENSIVE METABOLIC PANEL
ALT: 21 U/L (ref 0–44)
AST: 18 U/L (ref 15–41)
Albumin: 3.8 g/dL (ref 3.5–5.0)
Alkaline Phosphatase: 84 U/L (ref 38–126)
Anion gap: 7 (ref 5–15)
BUN: 11 mg/dL (ref 6–20)
CO2: 23 mmol/L (ref 22–32)
Calcium: 9.1 mg/dL (ref 8.9–10.3)
Chloride: 108 mmol/L (ref 98–111)
Creatinine, Ser: 0.74 mg/dL (ref 0.44–1.00)
GFR, Estimated: >60 mL/min (ref >60)
Glucose, Bld: 97 mg/dL (ref 70–99)
Potassium: 4.3 mmol/L (ref 3.5–5.1)
Sodium: 138 mmol/L (ref 135–145)
Total Bilirubin: 1.4 mg/dL — ABNORMAL HIGH (ref 0.3–1.2)
Total Protein: 6.9 g/dL (ref 6.5–8.1)

## 2021-03-17 LAB — VITAMIN D 25 HYDROXY (VIT D DEFICIENCY, FRACTURES): Vit D, 25-Hydroxy: 29.54 ng/mL — ABNORMAL LOW (ref 30–100)

## 2021-03-17 LAB — CBC WITH DIFFERENTIAL/PLATELET
Abs Immature Granulocytes: 0.03 10*3/uL (ref 0.00–0.07)
Basophils Absolute: 0 10*3/uL (ref 0.0–0.1)
Basophils Relative: 0 %
Eosinophils Absolute: 0.1 10*3/uL (ref 0.0–0.5)
Eosinophils Relative: 2 %
HCT: 44.4 % (ref 36.0–46.0)
Hemoglobin: 15 g/dL (ref 12.0–15.0)
Immature Granulocytes: 1 %
Lymphocytes Relative: 21 %
Lymphs Abs: 1.2 10*3/uL (ref 0.7–4.0)
MCH: 31.1 pg (ref 26.0–34.0)
MCHC: 33.8 g/dL (ref 30.0–36.0)
MCV: 92.1 fL (ref 80.0–100.0)
Monocytes Absolute: 0.4 10*3/uL (ref 0.1–1.0)
Monocytes Relative: 6 %
Neutro Abs: 4 10*3/uL (ref 1.7–7.7)
Neutrophils Relative %: 70 %
Platelets: 259 10*3/uL (ref 150–400)
RBC: 4.82 MIL/uL (ref 3.87–5.11)
RDW: 14.6 % (ref 11.5–15.5)
WBC: 5.7 10*3/uL (ref 4.0–10.5)
nRBC: 0 % (ref 0.0–0.2)

## 2021-03-17 LAB — IRON AND TIBC
Iron: 118 ug/dL (ref 28–170)
Saturation Ratios: 33 % — ABNORMAL HIGH (ref 10.4–31.8)
TIBC: 354 ug/dL (ref 250–450)
UIBC: 236 ug/dL

## 2021-03-17 LAB — FERRITIN: Ferritin: 70 ng/mL (ref 11–307)

## 2021-03-17 LAB — VITAMIN B12: Vitamin B-12: 149 pg/mL — ABNORMAL LOW (ref 180–914)

## 2021-03-17 LAB — LIPID PANEL
Cholesterol: 149 mg/dL (ref 0–200)
HDL: 61 mg/dL (ref >40)
LDL Cholesterol: 79 mg/dL (ref 0–99)
Total CHOL/HDL Ratio: 2.4 RATIO
Triglycerides: 44 mg/dL (ref <150)
VLDL: 9 mg/dL (ref 0–40)

## 2021-03-17 LAB — TSH: TSH: 0.855 u[IU]/mL (ref 0.350–4.500)

## 2021-03-17 MED ORDER — FLUOXETINE HCL 20 MG PO CAPS: 20.0000 mg | ORAL_CAPSULE | Freq: Every day | ORAL | 1 refills | Status: DC

## 2021-03-17 NOTE — Progress Notes (Signed)
Virtual Visit via Telephone Note  I connected with Stephanie Dyer on 03/17/21 at 9:14 AM by telephone and verified that I am speaking with the correct person using two identifiers. Stephanie Dyer is currently located at home and nobody is currently with her during this visit. The provider, Loman Brooklyn, FNP is located in their home at time of visit.  I discussed the limitations, risks, security and privacy concerns of performing an evaluation and management service by telephone and the availability of in person appointments. I also discussed with the patient that there may be a patient responsible charge related to this service. The patient expressed understanding and agreed to proceed.  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 takes Xarelto daily. The recommendation by hematology is to continue indefinitely since her PEs appear unprovoked.   Iron deficiency: due to intestinal malabsorption s/p gastric bypass. She has received IV iron supplementation due to failure of oral iron supplementation. This is managed by her hematologist and she is repeating blood work today to assess her numbers after the infusions.  Migraines: takes Topamax daily for prevention. Previously failed treatment with sumatriptan, venlafaxine, rizatriptan, fioricet, and Tylenol. She is unable to take TCAs due to the potential for weight gain s/p gastric bypass and should avoid beta-blockers due to asthma.   Thyroid cyst/nodules: patient reports she was told before she moved here almost 2 years ago to have her thyroid ultrasound repeated in 6 months. She was previously referred to endocrinologist in September 2021, but it appears they were unable to reach her and closed out the referral.   Environmental allergies: patient saw Dr. Maudie Mercury with allergy and asthma in November 2021 who  wanted to hold off on allergy testing since patient had recently moved here to [place] from [place] and the flora in this region is different.   Asthma: has Albuterol to take as needed and takes Singulair at night. When she saw Dr. Maudie Mercury with allergy and asthma in November 2021 they held off on spirometry due to the COVID-19 pandemic.   Vitamin D deficiency: last vitamin D level was normal in September 2021.  Vitamin B12 deficiency: last level low in January 2022.  GERD: taking pantoprazole daily.  Anxiety/Borderline Personality Disorder: patient previously reported being diagnosed by her counseling with borderline personality disorder and was referred to psychiatry. She sees her counselor every two weeks and does also see the psychiatrist. States their have been no changes in her medication. She feels in general she does very well but reports the past few weeks have been harder than usual due to the unexpected death of her best friend and also the death of her uncle.   Her only concern today is that her pink eye is coming back, but she has started using her eye drops again that she has at home from a previous infection.    ROS: Per HPI  Current Outpatient Medications:    albuterol (VENTOLIN HFA) 108 (90 Base) MCG/ACT inhaler, Inhale 2 puffs into the lungs every 6 (six) hours as needed., Disp: 18 g, Rfl: 4   EPINEPHrine 0.3 mg/0.3 mL IJ SOAJ injection, Inject 0.3 mg into the muscle as needed., Disp: 2 each, Rfl: 2   FLUoxetine (PROZAC) 20 MG capsule, Take 1 capsule (20 mg total) by mouth daily., Disp: 30 capsule, Rfl: 0   pantoprazole (  PROTONIX) 40 MG tablet, Take 1 tablet (40 mg total) by mouth daily., Disp: 90 tablet, Rfl: 1   rivaroxaban (XARELTO) 20 MG TABS tablet, Take 1 tablet (20 mg total) by mouth daily with supper., Disp: 90 tablet, Rfl: 3   topiramate (TOPAMAX) 50 MG tablet, Take 1.5 tablets (75 mg total) by mouth daily., Disp: 135 tablet, Rfl: 1  Allergies  Allergen Reactions    Codeine Anaphylaxis and Other (See Comments)    Other reaction(s): Projectile vomiting, throat closes Headache   Morphine Anaphylaxis    Other reaction(s): Projectile vomiting, throat clos Other reaction(s): Projectile vomiting, throat clos   Other Anaphylaxis    peanuts   Fioricet [Butalbital-Apap-Caffeine] Other (See Comments)    Headache   Nsaids Other (See Comments) and Nausea And Vomiting    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   Shellfish Allergy Swelling   Vancomycin Itching and Other (See Comments)    Itching & flushed skin.   Venlafaxine Other (See Comments)    GI upset   Zolpidem Other (See Comments)    Ambien-Parasomnia   Latex Rash   Past Medical History:  Diagnosis Date   Angio-edema    Ankylosing spondylitis (HCC)    Anxiety    Arthritis    Asthma    Cervical cancer ([place]) 1998   Elevated LFTs    Environmental allergies    GERD (gastroesophageal reflux disease)    History of gastric bypass    History of iron deficiency anemia    Hypoglycemia    Kidney stones    Lumbar back pain    Migraines    PCOS (polycystic ovarian syndrome)    Pseudotumor cerebri 2006   Thyroid cyst    Urticaria    Vitamin B12 deficiency 03/31/2020   Vitamin D deficiency     Observations/Objective: A&O  No respiratory distress or wheezing audible over the phone Mood, judgement, and thought processes all WNL  Assessment and Plan: 1. Migraine with aura and without status migrainosus, not intractable Well controlled on current regimen.  - CMP14+EGFR; Future  2. Thyroid cyst - TSH; Future - US THYROID; Future  3. History of pulmonary embolus (PE) Continue Xarelto indefinitely. Managed by hematology.  4. Vitamin D deficiency Labs to assess. - VITAMIN D 25 Hydroxy (Vit-D Deficiency, Fractures); Future  5. Vitamin B12 deficiency Labs to assess. - Vitamin B12; Future  6. Iron deficiency Patient is getting labs today as ordered by her  hematologist who manages.  7. Mild intermittent asthma without complication Well controlled on current regimen.   8. Environmental allergies Well controlled on current regimen.   9. Gastroesophageal reflux disease without esophagitis Well controlled on current regimen.  - CMP14+EGFR; Future  10. Generalized anxiety disorder Well controlled on current regimen.  - CMP14+EGFR; Future - FLUoxetine (PROZAC) 20 MG capsule; Take 1 capsule (20 mg total) by mouth daily.  Dispense: 90 capsule; Refill: 1  11. Borderline personality disorder ([place]) Well controlled on current regimen.  - CMP14+EGFR; Future - FLUoxetine (PROZAC) 20 MG capsule; Take 1 capsule (20 mg total) by mouth daily.  Dispense: 90 capsule; Refill: 1  12. Ankylosing spondylitis, unspecified site of spine ([place]) Tylenol/Ibuprofen for pain.  13. Obesity (BMI 30.0-34.9) - CMP14+EGFR; Future - Lipid panel; Future - TSH; Future  14. Bacterial conjunctivitis Continue drops as previously prescribed.    Follow Up Instructions: Return as directed after labs result.  I discussed the assessment and treatment plan with the patient. The patient was  provided an opportunity to ask questions and all were answered. The patient agreed with the plan and demonstrated an understanding of the instructions.   The patient was advised to call back or seek an in-person evaluation if the symptoms worsen or if the condition fails to improve as anticipated.  The above assessment and management plan was discussed with the patient. The patient verbalized understanding of and has agreed to the management plan. Patient is aware to call the clinic if symptoms persist or worsen. Patient is aware when to return to the clinic for a follow-up visit. Patient educated on when it is appropriate to go to the emergency department.   Time call ended: 9:39 AM  I provided 25 minutes of non-face-to-face time during this encounter.  Hendricks Limes, MSN, APRN,  FNP-C [facility] 03/17/21

## 2021-03-23 ENCOUNTER — Encounter (HOSPITAL_COMMUNITY): Payer: Self-pay | Admitting: Hematology

## 2021-03-23 ENCOUNTER — Ambulatory Visit (HOSPITAL_COMMUNITY)
Admission: RE | Admit: 2021-03-23 | Discharge: 2021-03-23 | Disposition: A | Discharge status: Home / Self Care | Payer: Medicare Other | Source: Ambulatory Visit | Attending: Family Medicine | Admitting: Family Medicine

## 2021-03-23 ENCOUNTER — Other Ambulatory Visit: Payer: Self-pay

## 2021-03-23 DIAGNOSIS — E041 Nontoxic single thyroid nodule: Secondary | ICD-10-CM | POA: Insufficient documentation

## 2021-03-23 NOTE — Progress Notes (Signed)
Virtual Visit via Telephone Note [facility]  I connected with Stephanie Dyer  on 03/24/21 at 8:45 AM by telephone and verified that I am speaking with the correct person using two identifiers.  Location: Patient: Home Provider: [facility]   I discussed the limitations, risks, security and privacy concerns of performing an evaluation and management service by telephone and the availability of in person appointments. I also discussed with the patient that there may be a patient responsible charge related to this service. The patient expressed understanding and agreed to proceed.   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 female returns for routine follow-up of her iron deficiency and her history of DVT/PE x2, on chronic anticoagulation.  She was last seen by Tarri Abernethy PA-C on 11/13/2020.    She remains on chronic Xarelto due to her recurrent unprovoked PE.  No symptoms of DVT such as unilateral leg swelling, pain, and erythema.  No symptoms of PE such as new shortness of breath, chest pain, cough, hemoptysis, or palpitations.  She has not had any major bleeding events while on Xarelto, but does report easy bruising.  She had an isolated episode of epistaxis last week, which she relates to altitude from being in the [place] of [place] at the time.  No hematuria, hematemesis, hematochezia, or melena.  She reports increased fatigue and headaches.  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  She has shortness of breath from her underlying asthma.  She denies any pica, restless legs, chest pain, lightheadedness.  She reports 25% energy  and 100% appetite.  She reports that she is maintaining a stable weight at this time.    OBSERVATIONS/OBJECTIVE: Review of Systems  Constitutional:  Positive for malaise/fatigue. Negative for chills, diaphoresis, fever and weight loss.  Respiratory:  Positive for cough and shortness of breath (Asthma).   Cardiovascular:  Negative for chest pain and palpitations.  Gastrointestinal:  Negative for abdominal pain, blood in stool, melena, nausea and vomiting.  Neurological:  Positive for dizziness, loss of consciousness (Related to firefighting gear malfunction during training event) and headaches.  Psychiatric/Behavioral:  Positive for depression. The patient is nervous/anxious and has insomnia.     PHYSICAL EXAM (per limitations of virtual telephone visit): The patient is alert and oriented x 3, exhibiting adequate mentation, good mood, and ability to speak in full sentences and execute sound judgement.   ASSESSMENT & PLAN: 1.  Recurrent PE, on chronic anticoagulation - Initial pulmonary embolism in 2008, provoked in the setting of hospitalization for bronchitis/pneumonia - Second pulmonary embolism June 2022 was diagnosed in ED at [facility] after patient presented with complaints of left-sided chest pain and LLE pain - CTA chest (08/16/2020) showed age-indeterminate subsegmental PE but no evidence of acute PE.  - Venous duplex ultrasound of bilateral lower extremities (08/29/2020): Negative for DVT - Patient reports a family history of blood clots, but no family members with definitive diagnosis of hypercoagulable disorder - Hypercoagulable work-up (08/29/2020) was unremarkable  - She is not on any oral birth control - Recommended indefinite anticoagulation due to recurrent pulmonary embolism, which appears to be unprovoked - Patient understands risks  of anticoagulation including increased risk of bleeding, but feels comfortable remaining on Xarelto, given her personal history of PE x2 as  well as family history of blood clots - She is tolerating Xarelto well without major bleeding events   - No signs or symptoms of recurrent DVT/PE   - PLAN: Continue Xarelto indefinitely.  We will continue to reassess periodically the ongoing risks and benefits of this treatment plan. - Patient is planning on receiving tattoo in the coming months, and was recommended by her tattoo artist to stop Xarelto for the 24 hours prior to tattooing.  We discussed that this does slightly increase her risk of blood clots during that timeframe, but that overall it would be allowable.  She verbalized understanding and acceptance of the risks of stopping Xarelto to receive a tattoo.   2.  Iron deficiency state, without anemia - Patient had history of gastric bypass in 2011, has had issues with iron level since that time and has required previous IV iron at another facility - Suspect iron deficiency secondary to intestinal malabsorption following gastric bypass surgery - Labs at last office visit in June 2022 showed ferritin 7 with elevated TIBC 516 and iron saturation 15% - Iron levels failed to improve with oral supplementation - Feraheme x2 on 11/21/2020 and 11/28/2020 - Most recent labs (03/17/2021): Hgb 15.0/MCV 92.1, ferritin 70, iron saturation 33%. -She is symptomatic with significant fatigue and headaches, but denies any signs or symptoms of major blood loss   - PLAN: IV iron with Feraheme x 2 due to significant fatigue in the setting of ferritin < 100. -  Will repeat labs with phone visit in 4 months.  3.  Other deficiencies - Labs from 03/17/2021 that were ordered by PCP showed low B12 (149) and low vitamin D (29.54) - She was started on vitamin B12 cyanocobalamin 500 MCG once daily and vitamin D3 1000 units once daily by her PCP - PLAN: Continue B12 and vitamin D supplementation per PCP.  We will defer ongoing monitoring to PCP.   FOLLOW UP INSTRUCTIONS: IV Feraheme x2 Labs in 4 months Phone visit  after labs    I discussed the assessment and treatment plan with the patient. The patient was provided an opportunity to ask questions and all were answered. The patient agreed with the plan and demonstrated an understanding of the instructions.   The patient was advised to call back or seek an in-person evaluation if the symptoms worsen or if the condition fails to improve as anticipated.  I provided 16 minutes of non-face-to-face time during this encounter.   Harriett Rush, PA-C 03/24/2021 9:05 AM

## 2021-03-24 ENCOUNTER — Inpatient Hospital Stay (HOSPITAL_BASED_OUTPATIENT_CLINIC_OR_DEPARTMENT_OTHER): Payer: Medicare Other | Admitting: Physician Assistant

## 2021-03-24 DIAGNOSIS — I2699 Other pulmonary embolism without acute cor pulmonale: Secondary | ICD-10-CM | POA: Diagnosis not present

## 2021-03-24 DIAGNOSIS — D509 Iron deficiency anemia, unspecified: Secondary | ICD-10-CM

## 2021-03-29 DIAGNOSIS — R079 Chest pain, unspecified: Secondary | ICD-10-CM | POA: Diagnosis not present

## 2021-03-29 DIAGNOSIS — R0789 Other chest pain: Secondary | ICD-10-CM | POA: Diagnosis not present

## 2021-03-29 DIAGNOSIS — Z7901 Long term (current) use of anticoagulants: Secondary | ICD-10-CM | POA: Diagnosis not present

## 2021-03-29 DIAGNOSIS — R0602 Shortness of breath: Secondary | ICD-10-CM | POA: Diagnosis not present

## 2021-03-29 DIAGNOSIS — Z86711 Personal history of pulmonary embolism: Secondary | ICD-10-CM | POA: Diagnosis not present

## 2021-03-29 DIAGNOSIS — R9431 Abnormal electrocardiogram [ECG] [EKG]: Secondary | ICD-10-CM | POA: Diagnosis not present

## 2021-03-29 DIAGNOSIS — M79661 Pain in right lower leg: Secondary | ICD-10-CM | POA: Diagnosis not present

## 2021-03-29 DIAGNOSIS — I498 Other specified cardiac arrhythmias: Secondary | ICD-10-CM | POA: Diagnosis not present

## 2021-03-29 DIAGNOSIS — Z86718 Personal history of other venous thrombosis and embolism: Secondary | ICD-10-CM | POA: Diagnosis not present

## 2021-03-30 ENCOUNTER — Ambulatory Visit (HOSPITAL_COMMUNITY): Payer: Medicare Other

## 2021-04-01 DIAGNOSIS — R0781 Pleurodynia: Secondary | ICD-10-CM | POA: Diagnosis not present

## 2021-04-01 DIAGNOSIS — S298XXA Other specified injuries of thorax, initial encounter: Secondary | ICD-10-CM | POA: Diagnosis not present

## 2021-04-01 DIAGNOSIS — R0789 Other chest pain: Secondary | ICD-10-CM | POA: Diagnosis not present

## 2021-04-06 ENCOUNTER — Other Ambulatory Visit: Payer: Self-pay

## 2021-04-06 ENCOUNTER — Inpatient Hospital Stay (HOSPITAL_COMMUNITY): Payer: Medicare Other

## 2021-04-06 VITALS — BP 100/58 | HR 58 | Temp 98.3°F | Resp 18 | Ht 65.0 in | Wt 185.0 lb

## 2021-04-06 DIAGNOSIS — E611 Iron deficiency: Secondary | ICD-10-CM

## 2021-04-06 MED ORDER — LORATADINE 10 MG PO TABS
10.0000 mg | ORAL_TABLET | Freq: Once | ORAL | Status: AC
  Administered 2021-04-06: 10 mg via ORAL
  Filled 2021-04-06: qty 1

## 2021-04-06 MED ORDER — SODIUM CHLORIDE 0.9 % IV SOLN: Freq: Once | INTRAVENOUS | Status: AC

## 2021-04-06 MED ORDER — SODIUM CHLORIDE 0.9 % IV SOLN
510.0000 mg | Freq: Once | INTRAVENOUS | Status: AC
  Administered 2021-04-06: 510 mg via INTRAVENOUS
  Filled 2021-04-06: qty 510

## 2021-04-06 MED ORDER — ACETAMINOPHEN 325 MG PO TABS
650.0000 mg | ORAL_TABLET | Freq: Once | ORAL | Status: AC
  Administered 2021-04-06: 650 mg via ORAL
  Filled 2021-04-06: qty 2

## 2021-04-06 MED ORDER — FAMOTIDINE 20 MG PO TABS
20.0000 mg | ORAL_TABLET | Freq: Once | ORAL | Status: AC
  Administered 2021-04-06: 20 mg via ORAL
  Filled 2021-04-06: qty 1

## 2021-04-06 NOTE — Patient Instructions (Signed)
[facility]  Discharge Instructions: Thank you for choosing [facility] to provide your oncology and hematology care.  If you have a lab appointment with the Cancer Center, please come in thru the Main Entrance and check in at the main information desk.  Wear comfortable clothing and clothing appropriate for easy access to any Portacath or PICC line.   We strive to give you quality time with your provider. You may need to reschedule your appointment if you arrive late (15 or more minutes).  Arriving late affects you and other patients whose appointments are after yours.  Also, if you miss three or more appointments without notifying the office, you may be dismissed from the clinic at the provider's discretion.      For prescription refill requests, have your pharmacy contact our office and allow 72 hours for refills to be completed.    Today you received Feraheme.    BELOW ARE SYMPTOMS THAT SHOULD BE REPORTED IMMEDIATELY: *FEVER GREATER THAN 100.4 F (38 C) OR HIGHER *CHILLS OR SWEATING *NAUSEA AND VOMITING THAT IS NOT CONTROLLED WITH YOUR NAUSEA MEDICATION *UNUSUAL SHORTNESS OF BREATH *UNUSUAL BRUISING OR BLEEDING *URINARY PROBLEMS (pain or burning when urinating, or frequent urination) *BOWEL PROBLEMS (unusual diarrhea, constipation, pain near the anus) TENDERNESS IN MOUTH AND THROAT WITH OR WITHOUT PRESENCE OF ULCERS (sore throat, sores in mouth, or a toothache) UNUSUAL RASH, SWELLING OR PAIN  UNUSUAL VAGINAL DISCHARGE OR ITCHING   Items with * indicate a potential emergency and should be followed up as soon as possible or go to the Emergency Department if any problems should occur.  Please show the CHEMOTHERAPY ALERT CARD or IMMUNOTHERAPY ALERT CARD at check-in to the Emergency Department and triage nurse.  Should you have questions after your visit or need to cancel or reschedule your appointment, please contact [facility] [phone]  and  follow the prompts.  Office hours are 8:00 a.m. to 4:30 p.m. Monday - Friday. Please note that voicemails left after 4:00 p.m. may not be returned until the following business day.  We are closed weekends and major holidays. You have access to a nurse at all times for urgent questions. Please call the main number to the clinic [phone] and follow the prompts.  For any non-urgent questions, you may also contact your provider using MyChart. We now offer e-Visits for anyone 18 and older to request care online for non-urgent symptoms. For details visit [email or web address].   Also download the MyChart app! Go to the app store, search "MyChart", open the app, select [facility], and log in with your MyChart username and password.  Due to Covid, a mask is required upon entering the hospital/clinic. If you do not have a mask, one will be given to you upon arrival. For doctor visits, patients may have 1 support person aged 18 or older with them. For treatment visits, patients cannot have anyone with them due to current Covid guidelines and our immunocompromised population.  

## 2021-04-06 NOTE — Progress Notes (Signed)
Pt presents today for Feraheme IV iron infusion per provider's order. Vital signs stable pt c/o having an increase in nosebleeds. Pt stated she went from having nosebleeds once or twice to nosebleed daily. Message sent to  R.Pennington-PA. Peripheral IV started with good blood return pre and post infusion.  Message received from R.Pennington and she stated: Have her continue Xarelto.  We will place referral for ENT, for them to evaluate for cause of nosebleeds. If she has bleeding that does not resolve on its own after 30 minutes, she should go to the ER. Referral sent to ENT per scheduler. Pt made aware and verbalized understanding.   Feraheme given today per MD orders. Tolerated infusion without adverse affects. Vital signs stable. No complaints at this time. Discharged from clinic ambulatory in stable condition. Alert and oriented x 3. F/U with [facility] as scheduled.

## 2021-04-13 ENCOUNTER — Inpatient Hospital Stay (HOSPITAL_COMMUNITY): Payer: Medicare Other | Attending: Hematology

## 2021-04-13 VITALS — BP 103/59 | HR 75 | Temp 97.6°F | Resp 18

## 2021-04-13 DIAGNOSIS — E611 Iron deficiency: Secondary | ICD-10-CM | POA: Insufficient documentation

## 2021-04-13 MED ORDER — SODIUM CHLORIDE 0.9 % IV SOLN
510.0000 mg | Freq: Once | INTRAVENOUS | Status: AC
  Administered 2021-04-13: 510 mg via INTRAVENOUS
  Filled 2021-04-13: qty 510

## 2021-04-13 MED ORDER — SODIUM CHLORIDE 0.9 % IV SOLN: Freq: Once | INTRAVENOUS | Status: AC

## 2021-04-13 NOTE — Patient Instructions (Signed)
[facility]  Discharge Instructions: Thank you for choosing [facility] to provide your oncology and hematology care.  If you have a lab appointment with the [facility], please come in thru the Main Entrance and check in at the main information desk.  Wear comfortable clothing and clothing appropriate for easy access to any Portacath or PICC line.   We strive to give you quality time with your provider. You may need to reschedule your appointment if you arrive late (15 or more minutes).  Arriving late affects you and other patients whose appointments are after yours.  Also, if you miss three or more appointments without notifying the office, you may be dismissed from the clinic at the providers discretion.      For prescription refill requests, have your pharmacy contact our office and allow 72 hours for refills to be completed.    Today you received the following Feraheme .       To help prevent nausea and vomiting after your treatment, we encourage you to take your nausea medication as directed.  BELOW ARE SYMPTOMS THAT SHOULD BE REPORTED IMMEDIATELY: *FEVER GREATER THAN 100.4 F (38 C) OR HIGHER *CHILLS OR SWEATING *NAUSEA AND VOMITING THAT IS NOT CONTROLLED WITH YOUR NAUSEA MEDICATION *UNUSUAL SHORTNESS OF BREATH *UNUSUAL BRUISING OR BLEEDING *URINARY PROBLEMS (pain or burning when urinating, or frequent urination) *BOWEL PROBLEMS (unusual diarrhea, constipation, pain near the anus) TENDERNESS IN MOUTH AND THROAT WITH OR WITHOUT PRESENCE OF ULCERS (sore throat, sores in mouth, or a toothache) UNUSUAL RASH, SWELLING OR PAIN  UNUSUAL VAGINAL DISCHARGE OR ITCHING   Items with * indicate a potential emergency and should be followed up as soon as possible or go to the Emergency Department if any problems should occur.  Please show the CHEMOTHERAPY ALERT CARD or IMMUNOTHERAPY ALERT CARD at check-in to the Emergency Department and triage nurse.  Should you  have questions after your visit or need to cancel or reschedule your appointment, please contact [facility] [phone]  and follow the prompts.  Office hours are 8:00 a.m. to 4:30 p.m. Monday - Friday. Please note that voicemails left after 4:00 p.m. may not be returned until the following business day.  We are closed weekends and major holidays. You have access to a nurse at all times for urgent questions. Please call the main number to the clinic [phone] and follow the prompts.  For any non-urgent questions, you may also contact your provider using MyChart. We now offer e-Visits for anyone 18 and older to request care online for non-urgent symptoms. For details visit [email or web address].   Also download the MyChart app! Go to the app store, search "MyChart", open the app, select [email or web address] and log in with your MyChart username and password.  Due to Covid, a mask is required upon entering the hospital/clinic. If you do not have a mask, one will be given to you upon arrival. For doctor visits, patients may have 1 support person aged 69 or older with them. For treatment visits, patients cannot have anyone with them due to current Covid guidelines and our immunocompromised population.

## 2021-04-13 NOTE — Progress Notes (Signed)
Patient presents today for Feraheme infusion. Patient declined pre-meds prior to iron infusion. Reported patient's request to pharmacy and R. Pennington PA. Patient teaching performed pertaining to allergic reaction and pre-meds. Patient understanding verbalized.   Feraheme  given today per MD orders. Tolerated infusion without adverse affects. Vital signs stable. No complaints at this time. Discharged from clinic ambulatory in stable condition. Alert and oriented x 3. F/U with [facility] as scheduled.

## 2021-04-14 ENCOUNTER — Encounter: Payer: Self-pay | Admitting: Family Medicine

## 2021-04-14 ENCOUNTER — Ambulatory Visit: Payer: Medicaid Other | Admitting: Family Medicine

## 2021-04-15 ENCOUNTER — Encounter: Payer: Self-pay | Admitting: Family Medicine

## 2021-04-15 ENCOUNTER — Ambulatory Visit (INDEPENDENT_AMBULATORY_CARE_PROVIDER_SITE_OTHER): Payer: Medicare Other

## 2021-04-15 ENCOUNTER — Ambulatory Visit (INDEPENDENT_AMBULATORY_CARE_PROVIDER_SITE_OTHER): Payer: Medicare Other | Admitting: Family Medicine

## 2021-04-15 VITALS — BP 105/74 | HR 75 | Temp 98.4°F | Ht 65.0 in | Wt 186.2 lb

## 2021-04-15 DIAGNOSIS — S299XXA Unspecified injury of thorax, initial encounter: Secondary | ICD-10-CM | POA: Diagnosis not present

## 2021-04-15 DIAGNOSIS — M940 Chondrocostal junction syndrome [Tietze]: Secondary | ICD-10-CM | POA: Diagnosis not present

## 2021-04-15 DIAGNOSIS — R0781 Pleurodynia: Secondary | ICD-10-CM

## 2021-04-15 MED ORDER — PREDNISONE 20 MG PO TABS: ORAL_TABLET | ORAL | 0 refills | Status: DC
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
## 2021-04-24 ENCOUNTER — Encounter: Payer: Self-pay | Admitting: Nurse Practitioner

## 2021-04-24 ENCOUNTER — Ambulatory Visit (INDEPENDENT_AMBULATORY_CARE_PROVIDER_SITE_OTHER): Payer: Medicare Other | Admitting: Nurse Practitioner

## 2021-04-24 VITALS — BP 106/74 | HR 67 | Temp 98.3°F | Resp 20 | Ht 65.0 in | Wt 185.0 lb

## 2021-04-24 DIAGNOSIS — Z9229 Personal history of other drug therapy: Secondary | ICD-10-CM

## 2021-04-24 DIAGNOSIS — Z020 Encounter for examination for admission to educational institution: Secondary | ICD-10-CM

## 2021-04-24 NOTE — Progress Notes (Signed)
° °  Subjective:    Patient ID: Stephanie Dyer, female    DOB: 1975-08-17, 46 y.o.   MRN: [id number]  Patient comes in today needed hep b vaccine. 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 She is doing well today without complaints.    Review of Systems  Constitutional:  Negative for diaphoresis.  Eyes:  Negative for pain.  Respiratory:  Negative for shortness of breath.   Cardiovascular:  Negative for chest pain, palpitations and leg swelling.  Gastrointestinal:  Negative for abdominal pain.  Endocrine: Negative for polydipsia.  Skin:  Negative for rash.  Neurological:  Negative for dizziness, weakness and headaches.  Hematological:  Does not bruise/bleed easily.  All other systems reviewed and are negative.     Objective:   Physical Exam Vitals and nursing note reviewed.  Constitutional:      General: She is not in acute distress.    Appearance: Normal appearance. She is well-developed.  Neck:     Vascular: No carotid bruit or JVD.  Cardiovascular:     Rate and Rhythm: Normal rate and regular rhythm.     Heart sounds: Normal heart sounds.  Pulmonary:     Effort: Pulmonary effort is normal. No respiratory distress.     Breath sounds: Normal breath sounds. No wheezing or rales.  Chest:     Chest wall: No tenderness.  Abdominal:     General: Bowel sounds are normal. There is no distension or abdominal bruit.     Palpations: Abdomen is soft. There is no hepatomegaly, splenomegaly, mass or pulsatile mass.     Tenderness: There is no abdominal tenderness.  Musculoskeletal:        General: Normal range of motion.     Cervical back: Normal range of motion and neck supple.  Lymphadenopathy:     Cervical: No cervical adenopathy.  Skin:    General: Skin is warm and dry.  Neurological:     Mental Status: She is alert and oriented to person, place, and time.     Deep Tendon Reflexes: Reflexes are normal and symmetric.  Psychiatric:        Behavior: Behavior  normal.        Thought Content: Thought content normal.        Judgment: Judgment normal.    BP 106/74    Pulse 67    Temp 98.3 F (36.8 C) (Oral)    Resp 20    Ht 5\' 5"  (1.651 m)    Wt 185 lb (83.9 kg)    LMP 11/04/2018    SpO2 99%    BMI 30.79 kg/m        Assessment & Plan:   [name] in today with chief complaint of No chief complaint on file.   1. School physical exam Forms reviewed  2. Immunization series complete Will call with immunization titer - Hepatitis B surface antibody,quantitative    The above assessment and management plan was discussed with the patient. The patient verbalized understanding of and has agreed to the management plan. Patient is aware to call the clinic if symptoms persist or worsen. Patient is aware when to return to the clinic for a follow-up visit. Patient educated on when it is appropriate to go to the emergency department.   Mary-Margaret Hassell Done, FNP

## 2021-04-24 NOTE — Patient Instructions (Signed)
Hepatitis B Hepatitis B is a liver infection that is caused by a germ (virus). Hepatitis B can spread from person to person. This means it is contagious. There are two kinds of hepatitis B: Acute hepatitis B. This may last for 6 months or less. Long-term, or chronic, hepatitis B. This lasts for more than 6 months. It can lead to serious liver problems. What are the causes? This condition is caused by a germ. The germ may spread through: Contact with the body fluids of someone who has the germ. This includes: Blood. Breast milk. Tears or saliva. Semen or fluids from the vagina. Childbirth. A woman can pass the germ to her baby during birth. What increases the risk? Having contact with needles or syringes that have the germ on them. This may happen when you: Inject drugs. Get a tattoo or body piercing. Get a treatment that puts thin needles in your skin (acupuncture). Having sex with someone who has the germ and not using a condom. Living with or having close contact with a person who has the condition. Working in a job where you have contact with blood or body fluids. Traveling to a country where this condition is common. Getting treatment to clean your blood (kidney dialysis). Having been given donated blood (blood transfusion) or an organ transplant. What are the signs or symptoms? Not feeling hungry. A fever. Feeling very tired. Feeling like you may vomit or you vomit. Belly pain. Dark yellow pee (urine). Your skin or the white parts of your eyes look yellow (jaundice). Poop (stool) that is a light color. Joint pain. How is this treated? Treatment may include antiviral medicine. This may help: Lower your risk of serious liver problems. Lower the risk you will spread the germ to others. You will need to avoid taking certain medicines that can lead to liver damage. Follow these instructions at home: Medicines Take over-the-counter and prescription medicines only as told by  your doctor. If you were prescribed an antiviral medicine, take it as told by your doctor. Do not stop taking it even if you start to feel better. Do not take: Over-the-counter medicines that have acetaminophen in them. New medicines unless your doctor says that this is okay. This includes over-the-counter medicines or supplements. Activity Rest as needed. Do not have sex until your doctor says this is okay. Return to your normal activities as told by your doctor. Ask your doctor what activities are safe for you. Ask your doctor when you may return to school or work. Avoid swimming and using hot tubs until your doctor says this is okay. Eating and drinking   Eat a healthy diet. Eat plenty of: Fruits and vegetables. Whole grains. Low-fat (lean) meats or non-meat proteins, such as beans or tofu. Drink enough fluids to keep your pee pale yellow. Do not drink alcohol. General instructions Do not share any of these: Toothbrushes. Nail clippers. Razors. Wash your hands often with soap and water for at least 20 seconds. If you cannot use soap and water, use hand sanitizer. Tell your doctor about people you live with or have close contact with. They may need the hepatitis B vaccine. Cover any cuts or open sores on your skin. Follow instructions about how to not spread the germ. Keep all follow-up visits. How is this prevented? Get the hepatitis B vaccine. If you were exposed to the germ, your doctor may want you to get one of these: Human immunoglobulin. This fights germs in the body. The hepatitis B  vaccine. Even if you got this vaccine as a child, a booster shot may help. Wash your hands often with soap and water for at least 20 seconds. Use a condom every time you have vaginal, oral, or anal sex. Do not share needles or syringes. Do not handle blood or body fluids without gloves or other protection. Avoid getting tattoos or piercings in shops that are not clean. Where to find more  information Centers for Disease Control and Prevention: InternetEnthusiasts.hu World Health Organization: RoleLink.com.br Contact a doctor if: You get a rash. Your skin or the whites of your eyes turn yellow or turn more yellow than they were before. You have a fever or chills. Get help right away if: You are not able to eat or drink. You feel confused. You have trouble breathing. You have swelling in any of these areas: Skin. Throat. Mouth. Face. Belly. You have a seizure. You get very sleepy, or you have trouble waking up. These symptoms may be an emergency. Get help right away. Call your local emergency services (911 in the [place] Do not wait to see if the symptoms will go away. Do not drive yourself to the hospital. Summary Hepatitis B is a liver infection that is caused by a germ (virus). The germ can spread from person to person. Do not take any new medicines unless your doctor says that this is okay. Wash your hands often with soap and water for at least 20 seconds. This helps to prevent infection. This information is not intended to replace advice given to you by your health care provider. Make sure you discuss any questions you have with your health care provider. Document Revised: 01/10/2020 Document Reviewed: 01/10/2020 Elsevier Patient Education  2022 [employer].

## 2021-04-25 LAB — HEPATITIS B SURFACE ANTIBODY, QUANTITATIVE: Hepatitis B Surf Ab Quant: 368.5 m[IU]/mL (ref >9.9)

## 2021-05-15 ENCOUNTER — Telehealth: Payer: Self-pay | Admitting: Family Medicine

## 2021-05-15 DIAGNOSIS — R61 Generalized hyperhidrosis: Secondary | ICD-10-CM | POA: Diagnosis not present

## 2021-05-15 DIAGNOSIS — T782XXA Anaphylactic shock, unspecified, initial encounter: Secondary | ICD-10-CM | POA: Diagnosis not present

## 2021-05-15 DIAGNOSIS — L299 Pruritus, unspecified: Secondary | ICD-10-CM | POA: Diagnosis not present

## 2021-05-15 DIAGNOSIS — J4521 Mild intermittent asthma with (acute) exacerbation: Secondary | ICD-10-CM

## 2021-05-15 DIAGNOSIS — T781XXA Other adverse food reactions, not elsewhere classified, initial encounter: Secondary | ICD-10-CM | POA: Diagnosis not present

## 2021-05-15 DIAGNOSIS — R404 Transient alteration of awareness: Secondary | ICD-10-CM | POA: Diagnosis not present

## 2021-05-15 DIAGNOSIS — Z9109 Other allergy status, other than to drugs and biological substances: Secondary | ICD-10-CM

## 2021-05-15 DIAGNOSIS — R609 Edema, unspecified: Secondary | ICD-10-CM | POA: Diagnosis not present

## 2021-05-15 DIAGNOSIS — T7840XA Allergy, unspecified, initial encounter: Secondary | ICD-10-CM | POA: Diagnosis not present

## 2021-05-15 DIAGNOSIS — F17290 Nicotine dependence, other tobacco product, uncomplicated: Secondary | ICD-10-CM | POA: Diagnosis not present

## 2021-05-15 DIAGNOSIS — Z743 Need for continuous supervision: Secondary | ICD-10-CM | POA: Diagnosis not present

## 2021-05-15 DIAGNOSIS — R450 Nervousness: Secondary | ICD-10-CM | POA: Diagnosis not present

## 2021-05-15 MED ORDER — ALBUTEROL SULFATE HFA 108 (90 BASE) MCG/ACT IN AERS: 2.0000 | INHALATION_SPRAY | Freq: Four times a day (QID) | RESPIRATORY_TRACT | 4 refills | Status: DC | PRN

## 2021-05-15 MED ORDER — EPINEPHRINE 0.3 MG/0.3ML IJ SOAJ: 0.3000 mg | INTRAMUSCULAR | 2 refills | Status: DC | PRN

## 2021-05-15 NOTE — Telephone Encounter (Signed)
Called and spoke with pt - she state that she does not need appt - but does need refill on epi pen - had to use one today (went to ER)  ? ?Refilled for her  ?

## 2021-06-07 ENCOUNTER — Encounter: Payer: Self-pay | Admitting: Family Medicine

## 2021-06-07 DIAGNOSIS — Z9109 Other allergy status, other than to drugs and biological substances: Secondary | ICD-10-CM

## 2021-06-13 DIAGNOSIS — H6501 Acute serous otitis media, right ear: Secondary | ICD-10-CM | POA: Diagnosis not present

## 2021-06-22 ENCOUNTER — Ambulatory Visit: Payer: Medicare Other | Admitting: Family Medicine

## 2021-06-22 DIAGNOSIS — U071 COVID-19: Secondary | ICD-10-CM | POA: Diagnosis not present

## 2021-06-23 ENCOUNTER — Encounter: Payer: Self-pay | Admitting: Family Medicine

## 2021-06-30 DIAGNOSIS — J4530 Mild persistent asthma, uncomplicated: Secondary | ICD-10-CM | POA: Diagnosis not present

## 2021-06-30 DIAGNOSIS — J3089 Other allergic rhinitis: Secondary | ICD-10-CM | POA: Diagnosis not present

## 2021-06-30 DIAGNOSIS — Z91018 Allergy to other foods: Secondary | ICD-10-CM | POA: Diagnosis not present

## 2021-06-30 DIAGNOSIS — R040 Epistaxis: Secondary | ICD-10-CM | POA: Diagnosis not present

## 2021-07-03 ENCOUNTER — Encounter: Payer: Self-pay | Admitting: Family

## 2021-07-03 ENCOUNTER — Ambulatory Visit (INDEPENDENT_AMBULATORY_CARE_PROVIDER_SITE_OTHER): Payer: Medicare Other | Admitting: Family

## 2021-07-03 ENCOUNTER — Encounter (HOSPITAL_COMMUNITY): Payer: Self-pay | Admitting: Hematology

## 2021-07-03 VITALS — BP 120/70 | HR 85 | Temp 97.8°F | Ht 65.0 in | Wt 191.0 lb

## 2021-07-03 DIAGNOSIS — N3001 Acute cystitis with hematuria: Secondary | ICD-10-CM | POA: Diagnosis not present

## 2021-07-03 DIAGNOSIS — N12 Tubulo-interstitial nephritis, not specified as acute or chronic: Secondary | ICD-10-CM

## 2021-07-03 DIAGNOSIS — M545 Low back pain, unspecified: Secondary | ICD-10-CM

## 2021-07-03 DIAGNOSIS — R399 Unspecified symptoms and signs involving the genitourinary system: Secondary | ICD-10-CM

## 2021-07-03 LAB — URINALYSIS, ROUTINE W REFLEX MICROSCOPIC
Bilirubin, UA: NEGATIVE
Glucose, UA: NEGATIVE
Ketones, UA: NEGATIVE
Nitrite, UA: NEGATIVE
Protein,UA: NEGATIVE
Specific Gravity, UA: 1.015 (ref 1.005–1.030)
Urobilinogen, Ur: 0.2 mg/dL (ref 0.2–1.0)
pH, UA: 7 (ref 5.0–7.5)

## 2021-07-03 LAB — MICROSCOPIC EXAMINATION: Renal Epithel, UA: NONE SEEN /hpf

## 2021-07-03 MED ORDER — CEPHALEXIN 500 MG PO CAPS: 500.0000 mg | ORAL_CAPSULE | Freq: Two times a day (BID) | ORAL | 0 refills | Status: DC

## 2021-07-03 MED ORDER — CEFTRIAXONE SODIUM 1 G IJ SOLR
1.0000 g | Freq: Once | INTRAMUSCULAR | Status: AC
  Administered 2021-07-03: 1 g via INTRAMUSCULAR

## 2021-07-03 MED ORDER — FLUCONAZOLE 150 MG PO TABS: 150.0000 mg | ORAL_TABLET | ORAL | 0 refills | Status: DC | PRN

## 2021-07-03 NOTE — Progress Notes (Signed)
? ?Subjective:  ? ? Patient ID: Stephanie Dyer, female    DOB: 04-25-1975, 46 y.o.   MRN: [id number] ? ?Chief Complaint  ?Patient presents with  ? Back Pain  ?  Middle back pain x 3 days  ? Urinary Urgency  ?  X 3 days  ? ?PT presents to the office today with flank pain that started three days ago. She has hx of kidney stones.  ?Back Pain ?This is a new problem. Associated symptoms include dysuria.  ?Dysuria  ?This is a new problem. The current episode started in the past 7 days. The problem occurs intermittently. The problem has been waxing and waning. The quality of the pain is described as burning (throbbing). The pain is at a severity of 6/10. There has been no fever. Associated symptoms include flank pain, frequency, hesitancy and urgency. Pertinent negatives include no hematuria, nausea or vomiting. She has tried increased fluids for the symptoms. The treatment provided mild relief.  ? ? ? ?Review of Systems  ?Gastrointestinal:  Negative for nausea and vomiting.  ?Genitourinary:  Positive for dysuria, flank pain, frequency, hesitancy and urgency. Negative for hematuria.  ?Musculoskeletal:  Positive for back pain.  ?All other systems reviewed and are negative. ? ?   ?Objective:  ? Physical Exam ?Vitals reviewed.  ?Constitutional:   ?   General: She is not in acute distress. ?   Appearance: She is well-developed.  ?HENT:  ?   Head: Normocephalic and atraumatic.  ?Eyes:  ?   Pupils: Pupils are equal, round, and reactive to light.  ?Neck:  ?   Thyroid: No thyromegaly.  ?Cardiovascular:  ?   Rate and Rhythm: Normal rate and regular rhythm.  ?   Heart sounds: Normal heart sounds. No murmur heard. ?Pulmonary:  ?   Effort: Pulmonary effort is normal. No respiratory distress.  ?   Breath sounds: Normal breath sounds. No wheezing.  ?Abdominal:  ?   General: Bowel sounds are normal. There is no distension.  ?   Palpations: Abdomen is soft.  ?   Tenderness: There is no abdominal tenderness. There is right CVA  tenderness.  ?Musculoskeletal:     ?   General: No tenderness. Normal range of motion.  ?   Cervical back: Normal range of motion and neck supple.  ?Skin: ?   General: Skin is warm and dry.  ?Neurological:  ?   Mental Status: She is alert and oriented to person, place, and time.  ?   Cranial Nerves: No cranial nerve deficit.  ?   Deep Tendon Reflexes: Reflexes are normal and symmetric.  ?Psychiatric:     ?   Behavior: Behavior normal.     ?   Thought Content: Thought content normal.     ?   Judgment: Judgment normal.  ? ? ?BP 120/70   Pulse 85   Temp 97.8 ?F (36.6 ?C) (Temporal)   Ht '5\' 5"'$  (1.651 m)   Wt 191 lb (86.6 kg)   LMP 11/04/2018   BMI 31.78 kg/m?  ? ? ? ?   ?Assessment & Plan:  ? ?Stephanie Dyer comes in today with chief complaint of Back Pain (Middle back pain x 3 days) and Urinary Urgency (X 3 days) ? ? ?Diagnosis and orders addressed: ? ?1. Low back pain, unspecified back pain laterality, unspecified chronicity, unspecified whether sciatica present ?- Urinalysis, Routine w reflex microscopic ? ?2. UTI symptoms ? ?3. Acute cystitis with hematuria ?- cephALEXin (KEFLEX) 500 MG capsule;  Take 1 capsule (500 mg total) by mouth 2 (two) times daily.  Dispense: 14 capsule; Refill: 0 ?- cefTRIAXone (ROCEPHIN) injection 1 g ? ?4. Pyelonephritis ?- cefTRIAXone (ROCEPHIN) injection 1 g ? ? ?Rocephin given today ?Keflex BID for 7 days  ?Force fluids ?AZO over the counter X2 days ?RTO if symptoms worsen or do not improve  ?Culture pending ? ? ?Stephanie Dun, FNP ? ? ?

## 2021-07-03 NOTE — Patient Instructions (Addendum)
Pyelonephritis, Adult Pyelonephritis is an infection that occurs in the kidney. The kidneys are the organs that filter a person's blood and move waste out of the bloodstream and into the urine. Urine passes from the kidneys, through tubes called ureters, and into the bladder. There are two main types of pyelonephritis: Infections that come on quickly without any warning (acute pyelonephritis). Infections that last for a long period of time (chronic pyelonephritis). In most cases, the infection clears up with treatment and does not cause further problems. More severe infections or chronic infections can sometimes spread to the bloodstream or lead to other problems with the kidneys. What are the causes? This condition is usually caused by: Bacteria traveling from the bladder up to the kidney. This may occur after having a bladder infection (cystitis) or urinary tract infection (UTI). Bladder infections caused from bacteria traveling from the bloodstream to the kidney. What increases the risk? This condition is more likely to develop in: Pregnant women. Older people. People who have any of these conditions: Diabetes. Inflammation of the prostate gland (prostatitis), in males. Kidney stones or bladder stones. Other abnormalities of the kidney or ureter. Cancer. People who have a catheter placed in the bladder. People who are sexually active. Women who use spermicides. People who have had a prior UTI. What are the signs or symptoms? Symptoms of this condition include: Frequent urination. Strong or persistent urge to urinate. Burning or stinging when urinating. Abdominal pain. Back pain. Pain in the side or flank area. Fever or chills. Blood in the urine, or dark urine. Nausea or vomiting. How is this diagnosed? This condition may be diagnosed based on: Your medical history and a physical exam. Urine tests. Blood tests. You may also have imaging tests of the kidneys, such as an  ultrasound or CT scan. How is this treated? Treatment for this condition may depend on the severity of the infection. If the infection is mild and is found early, you may be treated with antibiotic medicines taken by mouth (orally). You will need to drink fluids to remain hydrated. If the infection is more severe, you may need to stay in the hospital and receive antibiotics given directly into a vein through an IV. You may also need to receive fluids through an IV if you are not able to remain hydrated. After your hospital stay, you may need to take oral antibiotics for a period of time. Other treatments may be required, depending on the cause of the infection. Follow these instructions at home: Medicines Take your antibiotic medicine as told by your health care provider. Do not stop taking the antibiotic even if you start to feel better. Take over-the-counter and prescription medicines only as told by your health care provider. General instructions  Drink enough fluid to keep your urine pale yellow. Avoid caffeine, tea, and carbonated beverages. They tend to irritate the bladder. Urinate often. Avoid holding in urine for long periods of time. Urinate before and after sex. After a bowel movement, women should cleanse from front to back. Use each tissue only once. Keep all follow-up visits as told by your health care provider. This is important. Contact a health care provider if: Your symptoms do not get better after 2 days of treatment. Your symptoms get worse. You have a fever. Get help right away if you: Are unable to take your antibiotics or fluids. Have shaking chills. Vomit. Have severe flank or back pain. Have extreme weakness or fainting. Summary Pyelonephritis is a urinary tract infection (  UTI) that occurs in the kidney. Treatment for this condition may depend on the severity of the infection. Take your antibiotic medicine as told by your health care provider. Do not stop  taking the antibiotic even if you start to feel better. Drink enough fluid to keep your urine pale yellow. Keep all follow-up visits as told by your health care provider. This is important. This information is not intended to replace advice given to you by your health care provider. Make sure you discuss any questions you have with your health care provider. Document Revised: 10/02/2020 Document Reviewed: 10/02/2020 Elsevier Patient Education  2023 [employer] Inc[employer]  

## 2021-07-10 DIAGNOSIS — J3089 Other allergic rhinitis: Secondary | ICD-10-CM | POA: Diagnosis not present

## 2021-07-14 DIAGNOSIS — J3089 Other allergic rhinitis: Secondary | ICD-10-CM | POA: Diagnosis not present

## 2021-07-17 ENCOUNTER — Inpatient Hospital Stay (HOSPITAL_COMMUNITY): Payer: Medicare Other | Attending: Hematology

## 2021-07-17 DIAGNOSIS — J3089 Other allergic rhinitis: Secondary | ICD-10-CM | POA: Diagnosis not present

## 2021-07-20 DIAGNOSIS — Z91018 Allergy to other foods: Secondary | ICD-10-CM | POA: Diagnosis not present

## 2021-07-24 ENCOUNTER — Telehealth (HOSPITAL_COMMUNITY): Payer: Medicare Other | Admitting: Physician Assistant

## 2021-07-24 DIAGNOSIS — J3089 Other allergic rhinitis: Secondary | ICD-10-CM | POA: Diagnosis not present

## 2021-07-27 DIAGNOSIS — J3089 Other allergic rhinitis: Secondary | ICD-10-CM | POA: Diagnosis not present

## 2021-07-30 DIAGNOSIS — J3089 Other allergic rhinitis: Secondary | ICD-10-CM | POA: Diagnosis not present

## 2021-08-06 DIAGNOSIS — J3089 Other allergic rhinitis: Secondary | ICD-10-CM | POA: Diagnosis not present

## 2021-08-13 DIAGNOSIS — J3089 Other allergic rhinitis: Secondary | ICD-10-CM | POA: Diagnosis not present

## 2021-08-17 DIAGNOSIS — J3089 Other allergic rhinitis: Secondary | ICD-10-CM | POA: Diagnosis not present

## 2021-08-19 DIAGNOSIS — J3089 Other allergic rhinitis: Secondary | ICD-10-CM | POA: Diagnosis not present

## 2021-08-21 DIAGNOSIS — J3089 Other allergic rhinitis: Secondary | ICD-10-CM | POA: Diagnosis not present

## 2021-08-24 DIAGNOSIS — J3089 Other allergic rhinitis: Secondary | ICD-10-CM | POA: Diagnosis not present

## 2021-08-26 DIAGNOSIS — J3089 Other allergic rhinitis: Secondary | ICD-10-CM | POA: Diagnosis not present

## 2021-08-28 DIAGNOSIS — J3089 Other allergic rhinitis: Secondary | ICD-10-CM | POA: Diagnosis not present

## 2021-08-31 ENCOUNTER — Encounter: Payer: Self-pay | Admitting: Family Medicine

## 2021-08-31 ENCOUNTER — Ambulatory Visit (INDEPENDENT_AMBULATORY_CARE_PROVIDER_SITE_OTHER): Payer: Medicare Other | Admitting: Family Medicine

## 2021-08-31 ENCOUNTER — Telehealth: Payer: Self-pay | Admitting: Family Medicine

## 2021-08-31 DIAGNOSIS — J3089 Other allergic rhinitis: Secondary | ICD-10-CM | POA: Diagnosis not present

## 2021-08-31 DIAGNOSIS — S30861A Insect bite (nonvenomous) of abdominal wall, initial encounter: Secondary | ICD-10-CM | POA: Diagnosis not present

## 2021-08-31 DIAGNOSIS — W57XXXA Bitten or stung by nonvenomous insect and other nonvenomous arthropods, initial encounter: Secondary | ICD-10-CM | POA: Diagnosis not present

## 2021-08-31 MED ORDER — DOXYCYCLINE HYCLATE 100 MG PO TABS: 200.0000 mg | ORAL_TABLET | Freq: Once | ORAL | 0 refills | Status: AC

## 2021-08-31 NOTE — Progress Notes (Signed)
   Virtual Visit  Note Due to COVID-19 pandemic this visit was conducted virtually. This visit type was conducted due to national recommendations for restrictions regarding the COVID-19 Pandemic (e.g. social distancing, sheltering in place) in an effort to limit this patient's exposure and mitigate transmission in our community. All issues noted in this document were discussed and addressed.  A physical exam was not performed with this format.  I connected with Stephanie Dyer on 08/31/21 at 1356 by telephone and verified that I am speaking with the correct person using two identifiers. Stephanie Dyer is currently located at work and no one is currently with her during the visit. The provider, Gabriel Earing, FNP is located in their office at time of visit.  I discussed the limitations, risks, security and privacy concerns of performing an evaluation and management service by telephone and the availability of in person appointments. I also discussed with the patient that there may be a patient responsible charge related to this service. The patient expressed understanding and agreed to proceed.  CC: tick bites  History and Present Illness:  HPI Stephanie Dyer reports 3 tick bites. She removed a tick from her abdomen, groin, and tight this morning. The ticks have been attached for at least 24 hours and were engorged. She denies fever, chills, myalgias, HA, neck pain, or neurological symptoms. Denies rash.     ROS As per HPI.   Observations/Objective: Alert and oriented x 3. Able to speak in full sentences without difficulty.    Assessment and Plan: Stephanie Dyer was seen today for tick removal.  Diagnoses and all orders for this visit:  Tick bite of multiple sites Prophylaxis as below. Strict return precautions.  -     doxycycline (VIBRA-TABS) 100 MG tablet; Take 2 tablets (200 mg total) by mouth once for 1 dose.  Follow Up Instructions: Return to office for new or worsening  symptoms, or if symptoms persist.     I discussed the assessment and treatment plan with the patient. The patient was provided an opportunity to ask questions and all were answered. The patient agreed with the plan and demonstrated an understanding of the instructions.   The patient was advised to call back or seek an in-person evaluation if the symptoms worsen or if the condition fails to improve as anticipated.  The above assessment and management plan was discussed with the patient. The patient verbalized understanding of and has agreed to the management plan. Patient is aware to call the clinic if symptoms persist or worsen. Patient is aware when to return to the clinic for a follow-up visit. Patient educated on when it is appropriate to go to the emergency department.   Time call ended:  1408  I provided 12 minutes of  non face-to-face time during this encounter.    Gabriel Earing, FNP  .

## 2021-09-01 DIAGNOSIS — S4991XA Unspecified injury of right shoulder and upper arm, initial encounter: Secondary | ICD-10-CM | POA: Diagnosis not present

## 2021-09-02 DIAGNOSIS — J3089 Other allergic rhinitis: Secondary | ICD-10-CM | POA: Diagnosis not present

## 2021-09-02 DIAGNOSIS — J4530 Mild persistent asthma, uncomplicated: Secondary | ICD-10-CM | POA: Diagnosis not present

## 2021-09-02 DIAGNOSIS — M25511 Pain in right shoulder: Secondary | ICD-10-CM | POA: Diagnosis not present

## 2021-09-04 DIAGNOSIS — J3089 Other allergic rhinitis: Secondary | ICD-10-CM | POA: Diagnosis not present

## 2021-09-07 DIAGNOSIS — J3089 Other allergic rhinitis: Secondary | ICD-10-CM | POA: Diagnosis not present

## 2021-09-09 DIAGNOSIS — J3089 Other allergic rhinitis: Secondary | ICD-10-CM | POA: Diagnosis not present

## 2021-09-10 ENCOUNTER — Ambulatory Visit (INDEPENDENT_AMBULATORY_CARE_PROVIDER_SITE_OTHER): Payer: Medicare Other | Admitting: Family Medicine

## 2021-09-10 ENCOUNTER — Encounter: Payer: Self-pay | Admitting: Family Medicine

## 2021-09-10 VITALS — BP 105/67 | HR 112 | Temp 98.2°F | Ht 65.0 in | Wt 192.0 lb

## 2021-09-10 DIAGNOSIS — W57XXXA Bitten or stung by nonvenomous insect and other nonvenomous arthropods, initial encounter: Secondary | ICD-10-CM | POA: Diagnosis not present

## 2021-09-10 DIAGNOSIS — B379 Candidiasis, unspecified: Secondary | ICD-10-CM | POA: Diagnosis not present

## 2021-09-10 DIAGNOSIS — T3695XA Adverse effect of unspecified systemic antibiotic, initial encounter: Secondary | ICD-10-CM | POA: Diagnosis not present

## 2021-09-10 MED ORDER — DOXYCYCLINE HYCLATE 100 MG PO TABS: 100.0000 mg | ORAL_TABLET | Freq: Two times a day (BID) | ORAL | 0 refills | Status: AC

## 2021-09-10 MED ORDER — FLUCONAZOLE 150 MG PO TABS: ORAL_TABLET | ORAL | 0 refills | Status: DC

## 2021-09-10 NOTE — Progress Notes (Signed)
   Acute Office Visit  Subjective:     Patient ID: Stephanie Dyer, female    DOB: 03-27-1975, 46 y.o.   MRN: [id number]  Chief Complaint  Patient presents with   tick bites    HPI Patient is in today for body aches, fatigue, and HA for the last 2-3 days. She recently had over 9 ticks bites that were attached for over 24 hours on 08/31/21. He took a 1 time dose of doxy 200 mg on on 09/01/21. She denies fever, nausea, vomiting, neck rigidity, changes in vision, weakness, numbness, tingling, confusion, or changes in gait. Denies rash.   ROS As per HPI.      Objective:    BP 105/67   Pulse (!) 112   Temp 98.2 F (36.8 C) (Temporal)   Ht '5\' 5"'$  (1.651 m)   Wt 192 lb (87.1 kg)   LMP 11/04/2018   SpO2 96%   BMI 31.95 kg/m    Physical Exam Vitals and nursing note reviewed.  Constitutional:      General: She is not in acute distress.    Appearance: She is not ill-appearing, toxic-appearing or diaphoretic.  Cardiovascular:     Rate and Rhythm: Normal rate and regular rhythm.     Heart sounds: Normal heart sounds. No murmur heard. Pulmonary:     Effort: Pulmonary effort is normal. No respiratory distress.     Breath sounds: Normal breath sounds.  Musculoskeletal:        General: No swelling.     Cervical back: Neck supple. No rigidity.     Right lower leg: No edema.     Left lower leg: No edema.  Skin:    General: Skin is warm and dry.     Findings: No erythema or rash.  Neurological:     General: No focal deficit present.     Mental Status: She is alert and oriented to person, place, and time.  Psychiatric:        Mood and Affect: Mood normal.        Behavior: Behavior normal.     No results found for any visits on 09/10/21.      Assessment & Plan:   Stephanie Dyer was seen today for tick bites.  Diagnoses and all orders for this visit:  Tick bite of multiple sites 11 days ago. Reporting fatigue, HA, and myalgias for the last 2-3 days. Start doxy as below  for lyme/RMSF coverage.  -     doxycycline (VIBRA-TABS) 100 MG tablet; Take 1 tablet (100 mg total) by mouth 2 (two) times daily for 14 days.  Antibiotic-induced yeast infection -     fluconazole (DIFLUCAN) 150 MG tablet; Take one tablet at completion of antibiotics. Can repeat in 3 days if needed.  Return to office for new or worsening symptoms, or if symptoms persist.   The patient indicates understanding of these issues and agrees with the plan.   Gwenlyn Perking, FNP

## 2021-09-12 DIAGNOSIS — M25511 Pain in right shoulder: Secondary | ICD-10-CM | POA: Diagnosis not present

## 2021-09-15 DIAGNOSIS — J3089 Other allergic rhinitis: Secondary | ICD-10-CM | POA: Diagnosis not present

## 2021-09-18 DIAGNOSIS — J3089 Other allergic rhinitis: Secondary | ICD-10-CM | POA: Diagnosis not present

## 2021-09-21 DIAGNOSIS — M25511 Pain in right shoulder: Secondary | ICD-10-CM | POA: Diagnosis not present

## 2021-09-25 DIAGNOSIS — J302 Other seasonal allergic rhinitis: Secondary | ICD-10-CM | POA: Diagnosis not present

## 2021-09-28 ENCOUNTER — Other Ambulatory Visit: Payer: Self-pay

## 2021-09-28 ENCOUNTER — Ambulatory Visit: Payer: Medicare Other | Attending: Orthopedic Surgery | Admitting: Physical Therapy

## 2021-09-28 DIAGNOSIS — M25511 Pain in right shoulder: Secondary | ICD-10-CM | POA: Diagnosis not present

## 2021-09-28 NOTE — Therapy (Signed)
OUTPATIENT PHYSICAL THERAPY SHOULDER EVALUATION   Patient Name: Stephanie Dyer MRN: [id number] DOB:07-03-1975, 46 y.o., female Today's Date: 09/28/2021   PT End of Session - 09/28/21 0856     Visit Number 1    Number of Visits 8    Date for PT Re-Evaluation 11/23/21    Authorization Type FOTO....    PT Start Time [phone]    PT Stop Time 0849    PT Time Calculation (min) 26 min    Activity Tolerance Patient tolerated treatment well    Behavior During Therapy WFL for tasks assessed/performed             Past Medical History:  Diagnosis Date   Angio-edema    Ankylosing spondylitis ([place])    Anxiety    Arthritis    Asthma    Cervical cancer ([place]) 1998   Elevated LFTs    Environmental allergies    GERD (gastroesophageal reflux disease)    History of gastric bypass    History of iron deficiency anemia    Hypoglycemia    Kidney stones    Lumbar back pain    Migraines    PCOS (polycystic ovarian syndrome)    Pseudotumor cerebri 2006   Thyroid cyst    Urticaria    Vitamin B12 deficiency 03/31/2020   Vitamin D deficiency    Past Surgical History:  Procedure Laterality Date   ADENOIDECTOMY     CESAREAN SECTION  2016   [place]  2015   GASTRIC BYPASS  2014   KNEE SURGERY Left 2001   LITHOTRIPSY  2021   TONSILLECTOMY     TOTAL ABDOMINAL HYSTERECTOMY  10/2018   Patient Active Problem List   Diagnosis Date Noted   History of pulmonary embolus (PE) 03/17/2021   Borderline personality disorder ([place]) 03/17/2021   Obesity (BMI 30.0-34.9) 03/17/2021   Iron deficiency 08/29/2020   Vitamin B12 deficiency 03/31/2020   History of systemic reaction to hymenoptera sting 01/22/2020   Marijuana use 12/09/2019   Generalized anxiety disorder 12/06/2019   Hot flashes 12/06/2019   Pain in female genitalia on intercourse 12/06/2019   Ankylosing spondylitis ([facility])    Thyroid cyst    Asthma    Environmental allergies    Migraines     Vitamin D deficiency    Lumbar back pain    PCOS (polycystic ovarian syndrome)    GERD (gastroesophageal reflux disease)    REFERRING PROVIDER: Latanya Maudlin MD  REFERRING DIAG: Pain of right shoulder joint.  THERAPY DIAG:  Acute pain of right shoulder  Rationale for Evaluation and Treatment Rehabilitation  ONSET DATE: ~2 months ago.  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  She was in tremendous pain and had limited range of motion.  She had an injection on 09/21/21 that has been very effective.  She reports no pain at rest today.  She can have some pain post-activity.    PERTINENT HISTORY: Ankylosing Spondylitis, OA, migraines, left knee surgery.  PAIN:  Are you having pain? No  PRECAUTIONS: None  WEIGHT BEARING RESTRICTIONS No  FALLS:  Has patient fallen in last 6 months? No  LIVING ENVIRONMENT: Lives in: House/apartment  OCCUPATION: Airline pilot.  PLOF: Independent  PATIENT GOALS Use right shoulder without  pain  OBJECTIVE:  PATIENT SURVEYS:  FOTO Complete.  POSTURE: Forward head and rounded shoulders.  UPPER EXTREMITY ROM:   Full active right shoulder range of motion.  UPPER EXTREMITY MMT:  Right ER is 4+/5, all others motions are essentially normal.  SHOULDER SPECIAL TESTS:  Impingement tests: Neer impingement test: negative and Hawkins/Kennedy impingement test: negative. PALPATION:  No significant areas of right shoulder pain.    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  She was in severe pain but a recent injection was very helpful.  Today, at rest she is having no pain but can have pain post-activity.  Her right shoulder active range of motion is WNL.  She has minimal weakness into right shoulder ER.  She has no significant areas of palpable tenderness.  Special testing is negative.  Patient will benefit from skilled physical therapy intervention to address deficits.   OBJECTIVE IMPAIRMENTS decreased activity tolerance.   REHAB POTENTIAL: Excellent  CLINICAL DECISION MAKING: Stable/uncomplicated  EVALUATION COMPLEXITY: Low   GOALS:  LONG TERM GOALS: Target date: 10/26/2021                    Ind with a HEP. Baseline:  Goal status: INITIAL  2.  UBE x 10 minutes at 90 RPM's with no c/o right shoulder pain. Baseline:  Goal status: INITIAL   PLAN: PT FREQUENCY: 1-2x/week  PT DURATION: 4 weeks  PLANNED INTERVENTIONS: Therapeutic exercises, Therapeutic activity, Neuromuscular re-education, Patient/Family education, Self Care, Electrical stimulation, Vasopneumatic device, Ultrasound, and Manual therapy  PLAN FOR NEXT SESSION: RW4, SDLY ER, PRE's, modalities as needed.   Mc Hollen, [place], PT 09/28/2021, 8:57 AM

## 2021-10-01 ENCOUNTER — Other Ambulatory Visit: Payer: Self-pay | Admitting: Family Medicine

## 2021-10-01 DIAGNOSIS — G43109 Migraine with aura, not intractable, without status migrainosus: Secondary | ICD-10-CM

## 2021-10-01 DIAGNOSIS — J3089 Other allergic rhinitis: Secondary | ICD-10-CM | POA: Diagnosis not present

## 2021-10-02 ENCOUNTER — Encounter: Payer: Medicare Other | Admitting: Physical Therapy

## 2021-10-05 DIAGNOSIS — J3089 Other allergic rhinitis: Secondary | ICD-10-CM | POA: Diagnosis not present

## 2021-10-06 ENCOUNTER — Ambulatory Visit: Payer: Medicare Other | Attending: Orthopedic Surgery

## 2021-10-09 DIAGNOSIS — J3089 Other allergic rhinitis: Secondary | ICD-10-CM | POA: Diagnosis not present

## 2021-10-20 ENCOUNTER — Other Ambulatory Visit: Payer: Self-pay | Admitting: Family Medicine

## 2021-10-20 DIAGNOSIS — F603 Borderline personality disorder: Secondary | ICD-10-CM

## 2021-10-20 DIAGNOSIS — F411 Generalized anxiety disorder: Secondary | ICD-10-CM

## 2021-10-21 DIAGNOSIS — J3089 Other allergic rhinitis: Secondary | ICD-10-CM | POA: Diagnosis not present

## 2021-10-26 DIAGNOSIS — J3089 Other allergic rhinitis: Secondary | ICD-10-CM | POA: Diagnosis not present

## 2021-10-30 DIAGNOSIS — J3089 Other allergic rhinitis: Secondary | ICD-10-CM | POA: Diagnosis not present

## 2021-11-03 ENCOUNTER — Ambulatory Visit (INDEPENDENT_AMBULATORY_CARE_PROVIDER_SITE_OTHER): Payer: Medicare Other | Admitting: Family Medicine

## 2021-11-03 ENCOUNTER — Encounter: Payer: Self-pay | Admitting: Family Medicine

## 2021-11-03 VITALS — BP 106/70 | HR 86 | Temp 98.2°F | Ht 65.0 in | Wt 190.6 lb

## 2021-11-03 DIAGNOSIS — K219 Gastro-esophageal reflux disease without esophagitis: Secondary | ICD-10-CM | POA: Diagnosis not present

## 2021-11-03 DIAGNOSIS — E669 Obesity, unspecified: Secondary | ICD-10-CM

## 2021-11-03 DIAGNOSIS — Z0000 Encounter for general adult medical examination without abnormal findings: Secondary | ICD-10-CM | POA: Diagnosis not present

## 2021-11-03 DIAGNOSIS — R6889 Other general symptoms and signs: Secondary | ICD-10-CM | POA: Diagnosis not present

## 2021-11-03 DIAGNOSIS — H938X3 Other specified disorders of ear, bilateral: Secondary | ICD-10-CM

## 2021-11-03 DIAGNOSIS — J3089 Other allergic rhinitis: Secondary | ICD-10-CM | POA: Diagnosis not present

## 2021-11-03 DIAGNOSIS — E538 Deficiency of other specified B group vitamins: Secondary | ICD-10-CM | POA: Diagnosis not present

## 2021-11-03 DIAGNOSIS — Z9109 Other allergy status, other than to drugs and biological substances: Secondary | ICD-10-CM

## 2021-11-03 DIAGNOSIS — F5101 Primary insomnia: Secondary | ICD-10-CM | POA: Diagnosis not present

## 2021-11-03 DIAGNOSIS — E611 Iron deficiency: Secondary | ICD-10-CM | POA: Diagnosis not present

## 2021-11-03 DIAGNOSIS — J4520 Mild intermittent asthma, uncomplicated: Secondary | ICD-10-CM | POA: Diagnosis not present

## 2021-11-03 DIAGNOSIS — Z0001 Encounter for general adult medical examination with abnormal findings: Secondary | ICD-10-CM

## 2021-11-03 DIAGNOSIS — F603 Borderline personality disorder: Secondary | ICD-10-CM

## 2021-11-03 DIAGNOSIS — E559 Vitamin D deficiency, unspecified: Secondary | ICD-10-CM | POA: Diagnosis not present

## 2021-11-03 DIAGNOSIS — F411 Generalized anxiety disorder: Secondary | ICD-10-CM

## 2021-11-03 DIAGNOSIS — R229 Localized swelling, mass and lump, unspecified: Secondary | ICD-10-CM

## 2021-11-03 DIAGNOSIS — E041 Nontoxic single thyroid nodule: Secondary | ICD-10-CM | POA: Diagnosis not present

## 2021-11-03 DIAGNOSIS — G43109 Migraine with aura, not intractable, without status migrainosus: Secondary | ICD-10-CM | POA: Diagnosis not present

## 2021-11-03 DIAGNOSIS — Z86711 Personal history of pulmonary embolism: Secondary | ICD-10-CM

## 2021-11-03 MED ORDER — FLUTICASONE PROPIONATE 50 MCG/ACT NA SUSP: 2.0000 | Freq: Every day | NASAL | 5 refills | Status: DC

## 2021-11-03 MED ORDER — TRAZODONE HCL 50 MG PO TABS: 50.0000 mg | ORAL_TABLET | Freq: Every evening | ORAL | 2 refills | Status: DC | PRN
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
## 2021-11-04 LAB — ANEMIA PROFILE B
Basophils Absolute: 0 10*3/uL (ref 0.0–0.2)
Basos: 0 %
EOS (ABSOLUTE): 0.1 10*3/uL (ref 0.0–0.4)
Eos: 2 %
Ferritin: 215 ng/mL — ABNORMAL HIGH (ref 15–150)
Folate: 6.8 ng/mL (ref >3.0)
Hematocrit: 44.4 % (ref 34.0–46.6)
Hemoglobin: 14.7 g/dL (ref 11.1–15.9)
Immature Grans (Abs): 0 10*3/uL (ref 0.0–0.1)
Immature Granulocytes: 0 %
Iron Saturation: 37 % (ref 15–55)
Iron: 111 ug/dL (ref 27–159)
Lymphocytes Absolute: 1.8 10*3/uL (ref 0.7–3.1)
Lymphs: 34 %
MCH: 30.8 pg (ref 26.6–33.0)
MCHC: 33.1 g/dL (ref 31.5–35.7)
MCV: 93 fL (ref 79–97)
Monocytes Absolute: 0.4 10*3/uL (ref 0.1–0.9)
Monocytes: 7 %
Neutrophils Absolute: 3.1 10*3/uL (ref 1.4–7.0)
Neutrophils: 57 %
Platelets: 239 10*3/uL (ref 150–450)
RBC: 4.77 x10E6/uL (ref 3.77–5.28)
RDW: 13.8 % (ref 11.7–15.4)
Retic Ct Pct: 1 % (ref 0.6–2.6)
Total Iron Binding Capacity: 301 ug/dL (ref 250–450)
UIBC: 190 ug/dL (ref 131–425)
Vitamin B-12: 149 pg/mL — ABNORMAL LOW (ref 232–1245)
WBC: 5.5 10*3/uL (ref 3.4–10.8)

## 2021-11-04 LAB — LIPID PANEL
Chol/HDL Ratio: 2.6 ratio (ref 0.0–4.4)
Cholesterol, Total: 159 mg/dL (ref 100–199)
HDL: 62 mg/dL (ref >39)
LDL Chol Calc (NIH): 80 mg/dL (ref 0–99)
Triglycerides: 89 mg/dL (ref 0–149)
VLDL Cholesterol Cal: 17 mg/dL (ref 5–40)

## 2021-11-04 LAB — TSH: TSH: 1.29 u[IU]/mL (ref 0.450–4.500)

## 2021-11-04 LAB — CMP14+EGFR
ALT: 19 IU/L (ref 0–32)
AST: 14 IU/L (ref 0–40)
Albumin/Globulin Ratio: 2 (ref 1.2–2.2)
Albumin: 4.1 g/dL (ref 3.9–4.9)
Alkaline Phosphatase: 94 IU/L (ref 44–121)
BUN/Creatinine Ratio: 12 (ref 9–23)
BUN: 9 mg/dL (ref 6–24)
Bilirubin Total: 0.5 mg/dL (ref 0.0–1.2)
CO2: 19 mmol/L — ABNORMAL LOW (ref 20–29)
Calcium: 9.1 mg/dL (ref 8.7–10.2)
Chloride: 111 mmol/L — ABNORMAL HIGH (ref 96–106)
Creatinine, Ser: 0.75 mg/dL (ref 0.57–1.00)
Globulin, Total: 2.1 g/dL (ref 1.5–4.5)
Glucose: 70 mg/dL (ref 70–99)
Potassium: 3.9 mmol/L (ref 3.5–5.2)
Sodium: 144 mmol/L (ref 134–144)
Total Protein: 6.2 g/dL (ref 6.0–8.5)
eGFR: 101 mL/min/{1.73_m2} (ref >59)

## 2021-11-04 LAB — VITAMIN D 25 HYDROXY (VIT D DEFICIENCY, FRACTURES): Vit D, 25-Hydroxy: 22.5 ng/mL — ABNORMAL LOW (ref 30.0–100.0)

## 2021-11-06 ENCOUNTER — Ambulatory Visit (INDEPENDENT_AMBULATORY_CARE_PROVIDER_SITE_OTHER): Payer: Medicare Other | Admitting: Family Medicine

## 2021-11-06 ENCOUNTER — Encounter: Payer: Self-pay | Admitting: Family Medicine

## 2021-11-06 ENCOUNTER — Encounter (HOSPITAL_COMMUNITY): Payer: Self-pay | Admitting: Hematology

## 2021-11-06 VITALS — BP 89/60 | HR 71 | Temp 98.2°F | Ht 65.0 in | Wt 185.5 lb

## 2021-11-06 DIAGNOSIS — R300 Dysuria: Secondary | ICD-10-CM

## 2021-11-06 DIAGNOSIS — F5101 Primary insomnia: Secondary | ICD-10-CM | POA: Insufficient documentation

## 2021-11-06 LAB — MICROSCOPIC EXAMINATION: Renal Epithel, UA: NONE SEEN /hpf

## 2021-11-06 LAB — URINALYSIS, ROUTINE W REFLEX MICROSCOPIC
Bilirubin, UA: NEGATIVE
Glucose, UA: NEGATIVE
Ketones, UA: NEGATIVE
Nitrite, UA: NEGATIVE
Protein,UA: NEGATIVE
Specific Gravity, UA: 1.025 (ref 1.005–1.030)
Urobilinogen, Ur: 2 mg/dL — ABNORMAL HIGH (ref 0.2–1.0)
pH, UA: 6.5 (ref 5.0–7.5)

## 2021-11-06 MED ORDER — SULFAMETHOXAZOLE-TRIMETHOPRIM 800-160 MG PO TABS: 1.0000 | ORAL_TABLET | Freq: Two times a day (BID) | ORAL | 0 refills | Status: AC

## 2021-11-06 MED ORDER — FLUCONAZOLE 150 MG PO TABS: ORAL_TABLET | ORAL | 0 refills | Status: DC

## 2021-11-06 NOTE — Progress Notes (Signed)
   Acute Office Visit  Subjective:     Patient ID: Stephanie Dyer, female    DOB: [birth year], 46 y.o.   MRN: [id number]  Chief Complaint  Patient presents with   Dysuria    Dysuria  This is a new problem. The current episode started yesterday. The problem occurs every urination. The problem has been rapidly worsening. There has been no fever. Associated symptoms include frequency and urgency. Pertinent negatives include no chills, discharge, flank pain, hematuria, hesitancy, nausea, sweats or vomiting. She has tried increased fluids for the symptoms. The treatment provided no relief. Her past medical history is significant for kidney stones and recurrent UTIs.     Review of Systems  Constitutional:  Negative for chills and fever.  Gastrointestinal:  Negative for abdominal pain, nausea and vomiting.  Genitourinary:  Positive for dysuria, frequency and urgency. Negative for flank pain, hematuria and hesitancy.  Musculoskeletal:  Negative for back pain.        Objective:    BP (!) 89/60   Pulse 71   Temp 98.2 F (36.8 C) (Temporal)   Ht '5\' 5"'$  (1.651 m)   Wt 185 lb 8 oz (84.1 kg)   LMP 11/04/2018   SpO2 96%   BMI 30.87 kg/m  BP Readings from Last 3 Encounters:  11/06/21 (!) 89/60  11/03/21 106/70  09/10/21 105/67      Physical Exam Vitals and nursing note reviewed.  Constitutional:      General: She is not in acute distress.    Appearance: She is not ill-appearing, toxic-appearing or diaphoretic.  Cardiovascular:     Rate and Rhythm: Normal rate and regular rhythm.     Heart sounds: Normal heart sounds. No murmur heard. Pulmonary:     Effort: Pulmonary effort is normal. No respiratory distress.     Breath sounds: Normal breath sounds.  Abdominal:     General: Bowel sounds are normal. There is no distension.     Palpations: Abdomen is soft.     Tenderness: There is no abdominal tenderness. There is no right CVA tenderness, left CVA tenderness, guarding or  rebound.  Skin:    General: Skin is warm and dry.  Neurological:     General: No focal deficit present.     Mental Status: She is alert and oriented to person, place, and time.  Psychiatric:        Mood and Affect: Mood normal.        Behavior: Behavior normal.     No results found for any visits on 11/06/21.      Assessment & Plan:   Laretha was seen today for dysuria.  Diagnoses and all orders for this visit:  Dysuria UA with small about of WBC and RBCs, few bacteria. Will treat with bactrim given history of UTIs and symptoms. Culture is pending. No red flags today. Diflucan order for hx of antibiotic induced yeast infections.  -     Urinalysis, Routine w reflex microscopic -     sulfamethoxazole-trimethoprim (BACTRIM DS) 800-160 MG tablet; Take 1 tablet by mouth 2 (two) times daily for 7 days. -     Urine Culture -     fluconazole (DIFLUCAN) 150 MG tablet; Take one tablet by mouth. May repeat in 3 days if symptoms persist.  Return to office for new or worsening symptoms, or if symptoms persist.   The patient indicates understanding of these issues and agrees with the plan.   Gwenlyn Perking, FNP

## 2021-11-10 LAB — URINE CULTURE

## 2021-11-11 DIAGNOSIS — J3089 Other allergic rhinitis: Secondary | ICD-10-CM | POA: Diagnosis not present

## 2021-11-16 DIAGNOSIS — J3089 Other allergic rhinitis: Secondary | ICD-10-CM | POA: Diagnosis not present

## 2021-11-19 DIAGNOSIS — J3089 Other allergic rhinitis: Secondary | ICD-10-CM | POA: Diagnosis not present

## 2021-11-23 DIAGNOSIS — J3089 Other allergic rhinitis: Secondary | ICD-10-CM | POA: Diagnosis not present

## 2021-11-26 DIAGNOSIS — J3089 Other allergic rhinitis: Secondary | ICD-10-CM | POA: Diagnosis not present

## 2021-12-05 ENCOUNTER — Other Ambulatory Visit: Payer: Self-pay | Admitting: Family Medicine

## 2021-12-11 DIAGNOSIS — J3089 Other allergic rhinitis: Secondary | ICD-10-CM | POA: Diagnosis not present

## 2021-12-15 DIAGNOSIS — J3089 Other allergic rhinitis: Secondary | ICD-10-CM | POA: Diagnosis not present

## 2021-12-18 DIAGNOSIS — J3089 Other allergic rhinitis: Secondary | ICD-10-CM | POA: Diagnosis not present

## 2022-01-06 ENCOUNTER — Other Ambulatory Visit (HOSPITAL_COMMUNITY): Payer: Self-pay | Admitting: Hematology and Oncology

## 2022-01-06 ENCOUNTER — Other Ambulatory Visit: Payer: Self-pay | Admitting: *Deleted

## 2022-01-06 DIAGNOSIS — I2699 Other pulmonary embolism without acute cor pulmonale: Secondary | ICD-10-CM

## 2022-01-06 DIAGNOSIS — D509 Iron deficiency anemia, unspecified: Secondary | ICD-10-CM

## 2022-01-06 NOTE — Telephone Encounter (Signed)
Made patient appt.

## 2022-01-07 ENCOUNTER — Inpatient Hospital Stay: Payer: Medicare Other | Attending: Hematology

## 2022-01-07 DIAGNOSIS — E611 Iron deficiency: Secondary | ICD-10-CM | POA: Insufficient documentation

## 2022-01-07 DIAGNOSIS — D509 Iron deficiency anemia, unspecified: Secondary | ICD-10-CM

## 2022-01-07 LAB — CBC WITH DIFFERENTIAL/PLATELET
Abs Immature Granulocytes: 0.01 10*3/uL (ref 0.00–0.07)
Basophils Absolute: 0 10*3/uL (ref 0.0–0.1)
Basophils Relative: 1 %
Eosinophils Absolute: 0.1 10*3/uL (ref 0.0–0.5)
Eosinophils Relative: 1 %
HCT: 41.9 % (ref 36.0–46.0)
Hemoglobin: 13.9 g/dL (ref 12.0–15.0)
Immature Granulocytes: 0 %
Lymphocytes Relative: 31 %
Lymphs Abs: 1.4 10*3/uL (ref 0.7–4.0)
MCH: 30.1 pg (ref 26.0–34.0)
MCHC: 33.2 g/dL (ref 30.0–36.0)
MCV: 90.7 fL (ref 80.0–100.0)
Monocytes Absolute: 0.3 10*3/uL (ref 0.1–1.0)
Monocytes Relative: 7 %
Neutro Abs: 2.7 10*3/uL (ref 1.7–7.7)
Neutrophils Relative %: 60 %
Platelets: 214 10*3/uL (ref 150–400)
RBC: 4.62 MIL/uL (ref 3.87–5.11)
RDW: 13.6 % (ref 11.5–15.5)
WBC: 4.4 10*3/uL (ref 4.0–10.5)
nRBC: 0 % (ref 0.0–0.2)

## 2022-01-07 LAB — IRON AND TIBC
Iron: 125 ug/dL (ref 28–170)
Saturation Ratios: 35 % — ABNORMAL HIGH (ref 10.4–31.8)
TIBC: 352 ug/dL (ref 250–450)
UIBC: 227 ug/dL

## 2022-01-07 LAB — FERRITIN: Ferritin: 134 ng/mL (ref 11–307)

## 2022-01-18 ENCOUNTER — Ambulatory Visit (INDEPENDENT_AMBULATORY_CARE_PROVIDER_SITE_OTHER): Payer: Medicare Other | Admitting: Nurse Practitioner

## 2022-01-18 ENCOUNTER — Encounter: Payer: Self-pay | Admitting: Nurse Practitioner

## 2022-01-18 DIAGNOSIS — R300 Dysuria: Secondary | ICD-10-CM | POA: Diagnosis not present

## 2022-01-18 MED ORDER — PHENAZOPYRIDINE HCL 95 MG PO TABS: 95.0000 mg | ORAL_TABLET | Freq: Three times a day (TID) | ORAL | 0 refills | Status: DC | PRN

## 2022-01-18 NOTE — Progress Notes (Signed)
   Virtual Visit  Note Due to COVID-19 pandemic this visit was conducted virtually. This visit type was conducted due to national recommendations for restrictions regarding the COVID-19 Pandemic (e.g. social distancing, sheltering in place) in an effort to limit this patient's exposure and mitigate transmission in our community. All issues noted in this document were discussed and addressed.  A physical exam was not performed with this format.  I connected with Stephanie Dyer on 01/18/22 at 9:50 AM by telephone and verified that I am speaking with the correct person using two identifiers. Stephanie Dyer is currently located at work during visit. The provider, Ivy Lynn, NP is located in their office at time of visit.  I discussed the limitations, risks, security and privacy concerns of performing an evaluation and management service by telephone and the availability of in person appointments. I also discussed with the patient that there may be a patient responsible charge related to this service. The patient expressed understanding and agreed to proceed.   History and Present Illness:  Dysuria  This is a recurrent problem. Episode onset: In the past 2 to 3 days. The problem has been gradually worsening. The quality of the pain is described as aching and burning. The pain is at a severity of 5/10. The pain is moderate. There has been no fever. Associated symptoms include flank pain, hematuria and urgency. Pertinent negatives include no chills, nausea or vomiting. She has tried acetaminophen for the symptoms. The treatment provided no relief. Her past medical history is significant for recurrent UTIs.      Review of Systems  Constitutional: Negative.  Negative for chills.  HENT: Negative.    Respiratory: Negative.    Cardiovascular: Negative.   Gastrointestinal: Negative.  Negative for nausea and vomiting.  Genitourinary:  Positive for dysuria, flank pain, hematuria and  urgency.  Skin: Negative.  Negative for rash.  All other systems reviewed and are negative.    Observations/Objective: Televisit patient not in distress.  Assessment and Plan:  UTI  vs  interstitial cystitis -urinalysis completed results pending -pyridium for pain Precaution and education provided -All questions answered -follow up with unresolved symptoms   Follow Up Instructions: Follow-up with unresolved symptoms.    I discussed the assessment and treatment plan with the patient. The patient was provided an opportunity to ask questions and all were answered. The patient agreed with the plan and demonstrated an understanding of the instructions.   The patient was advised to call back or seek an in-person evaluation if the symptoms worsen or if the condition fails to improve as anticipated.  The above assessment and management plan was discussed with the patient. The patient verbalized understanding of and has agreed to the management plan. Patient is aware to call the clinic if symptoms persist or worsen. Patient is aware when to return to the clinic for a follow-up visit. Patient educated on when it is appropriate to go to the emergency department.   Time call ended:  10:11 am   I provided 11 minutes of  non face-to-face time during this encounter.    Ivy Lynn, NP

## 2022-01-18 NOTE — Progress Notes (Unsigned)
Virtual Visit via Telephone Note [facility]  I connected with Stephanie Dyer  on 01/19/2022 at 9:15 AM by telephone and verified that I am speaking with the correct person using two identifiers.  Location: Patient: Home Provider: [facility]   I discussed the limitations, risks, security and privacy concerns of performing an evaluation and management service by telephone and the availability of in person appointments. I also discussed with the patient that there may be a patient responsible charge related to this service. The patient expressed understanding and agreed to proceed.  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 46 y.o. female returns for routine follow-up of her iron deficiency and her history of DVT/PE x2, on chronic anticoagulation.  She was last evaluated via telemedicine visit by Tarri Abernethy PA-C on 03/24/2021.  She currently reports significant fatigue, decreased appetite, and arthralgias which she attributes to RA flareup from colder weather.  She did feel slightly better after her IV Feraheme on 04/06/2021 and 04/13/2021.  She has occasional headaches. She has shortness of breath from her underlying asthma.  She denies any pica, restless legs, chest pain, lightheadedness, or syncope.  She remains on chronic Xarelto due to her recurrent unprovoked PE.  No symptoms of DVT such as unilateral leg swelling, pain, and erythema.  No symptoms of PE such as new shortness of breath, chest pain, cough, hemoptysis, or palpitations.  She has not had any major bleeding events while on Xarelto, but does report easy bruising.  No epistaxis, hematuria, hematemesis, hematochezia, or melena.   She reports 25% energy and 75% appetite.  She reports that  she is maintaining a stable weight at this time.   OBSERVATIONS/OBJECTIVE:  Review of Systems  Constitutional:  Positive for malaise/fatigue. Negative for chills, diaphoresis, fever and weight loss.  Respiratory:  Positive for shortness of breath (Asthma). Negative for cough.   Cardiovascular:  Negative for chest pain and palpitations.  Gastrointestinal:  Positive for diarrhea. Negative for abdominal pain, blood in stool, melena, nausea and vomiting.  Genitourinary:  Positive for dysuria.  Neurological:  Positive for headaches. Negative for dizziness and loss of consciousness.  Psychiatric/Behavioral:  Positive for depression. The patient is nervous/anxious and has insomnia.      PHYSICAL EXAM (per limitations of virtual telephone visit): The patient is alert and oriented x 3, exhibiting adequate mentation, good mood, and ability to speak in full sentences and execute sound judgement.   ASSESSMENT & PLAN: 1.  Recurrent PE, on chronic anticoagulation - Initial pulmonary embolism in 2008, provoked in the setting of hospitalization for bronchitis/pneumonia - Second pulmonary embolism June 2022 was diagnosed in ED at [facility] after patient presented with complaints of left-sided chest pain and LLE pain - CTA chest (08/16/2020) showed age-indeterminate subsegmental PE but no evidence of acute PE.  - Venous duplex ultrasound of bilateral lower extremities (08/29/2020): Negative for DVT - Patient reports a family history of blood clots, but no family members with definitive diagnosis of hypercoagulable disorder - Hypercoagulable work-up (08/29/2020) was unremarkable  - She is not on any oral birth control - Recommended indefinite anticoagulation due to recurrent pulmonary embolism, which appears to be unprovoked - Patient understands risks of anticoagulation including increased risk of bleeding, but feels comfortable remaining on Xarelto, given her  personal history of PE x2 as well as family  history of blood clots - She is tolerating Xarelto well without major bleeding events   - No signs or symptoms of recurrent DVT/PE   - PLAN: Continue Xarelto indefinitely.  We will continue to reassess periodically the ongoing risks and benefits of this treatment plan.  2.   Iron deficiency state, without anemia - Patient had history of gastric bypass in 2011, has had issues with iron level since that time and has required previous IV iron at another facility - Iron levels failed to improve with oral supplementation.  Suspect malabsorption secondary to gastric bypass surgery. - Most recent IV iron with Feraheme x2 on 04/06/2021 and 04/13/2021 - Most recent labs (01/07/2022): Hgb 13.9/MCV 90.7, ferritin 134, iron saturation 35 %. - She has ongoing fatigue, but denies any signs or symptoms of major blood loss - PLAN: No indication for IV iron at this time. -  Will check labs with phone visit in 6 months.  3.  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  Prescription sent to pharmacy for vitamin B12 1000 mcg injections, which patient will self administer monthly. - Continue vitamin D supplements. - We will recheck B12/MMA and vitamin D in 6 months  PLAN SUMMARY: >> Prescription to self administer B12 injections monthly sent to pharmacy >> Labs in 6 months (CBC/D, ferritin, iron/TIBC, B12, MMA, vitamin D) >> PHONE visit 1 week after labs   I discussed the assessment and treatment plan with the patient. The patient was provided an opportunity to ask questions and all were answered. The patient agreed with the plan and demonstrated an understanding of the instructions.   The patient was  advised to call back or seek an in-person evaluation if the symptoms worsen or if the condition fails to improve as anticipated.  I provided 22 minutes of non-face-to-face time during this encounter.   Harriett Rush, PA-C 01/19/2022 9:47 AM

## 2022-01-19 ENCOUNTER — Inpatient Hospital Stay (HOSPITAL_BASED_OUTPATIENT_CLINIC_OR_DEPARTMENT_OTHER): Payer: Medicare Other | Admitting: Physician Assistant

## 2022-01-19 ENCOUNTER — Other Ambulatory Visit: Payer: Self-pay

## 2022-01-19 DIAGNOSIS — D509 Iron deficiency anemia, unspecified: Secondary | ICD-10-CM | POA: Diagnosis not present

## 2022-01-19 DIAGNOSIS — I2699 Other pulmonary embolism without acute cor pulmonale: Secondary | ICD-10-CM

## 2022-01-19 DIAGNOSIS — E538 Deficiency of other specified B group vitamins: Secondary | ICD-10-CM | POA: Diagnosis not present

## 2022-01-19 DIAGNOSIS — E611 Iron deficiency: Secondary | ICD-10-CM

## 2022-01-19 MED ORDER — CYANOCOBALAMIN 1000 MCG/ML IJ SOLN: 1000.0000 ug | INTRAMUSCULAR | 1 refills | Status: DC

## 2022-01-19 MED ORDER — NEEDLES & SYRINGES MISC: 1 refills | Status: DC

## 2022-01-20 ENCOUNTER — Other Ambulatory Visit: Payer: Self-pay | Admitting: Family Medicine

## 2022-01-20 DIAGNOSIS — Z1231 Encounter for screening mammogram for malignant neoplasm of breast: Secondary | ICD-10-CM

## 2022-01-26 ENCOUNTER — Encounter: Payer: Self-pay | Admitting: Family Medicine

## 2022-01-26 ENCOUNTER — Ambulatory Visit: Payer: Medicare Other | Admitting: Family

## 2022-01-26 DIAGNOSIS — M25511 Pain in right shoulder: Secondary | ICD-10-CM | POA: Diagnosis not present

## 2022-01-27 ENCOUNTER — Other Ambulatory Visit: Payer: Self-pay | Admitting: Family Medicine

## 2022-01-27 DIAGNOSIS — F603 Borderline personality disorder: Secondary | ICD-10-CM

## 2022-01-27 DIAGNOSIS — F411 Generalized anxiety disorder: Secondary | ICD-10-CM

## 2022-02-15 ENCOUNTER — Other Ambulatory Visit: Payer: Self-pay | Admitting: Family Medicine

## 2022-02-15 DIAGNOSIS — G43109 Migraine with aura, not intractable, without status migrainosus: Secondary | ICD-10-CM

## 2022-02-17 ENCOUNTER — Ambulatory Visit (INDEPENDENT_AMBULATORY_CARE_PROVIDER_SITE_OTHER): Payer: Medicare Other | Admitting: Family Medicine

## 2022-02-17 ENCOUNTER — Encounter: Payer: Self-pay | Admitting: Family Medicine

## 2022-02-17 VITALS — BP 95/62 | HR 60 | Temp 99.9°F | Ht 65.0 in | Wt 184.0 lb

## 2022-02-17 DIAGNOSIS — Z9109 Other allergy status, other than to drugs and biological substances: Secondary | ICD-10-CM

## 2022-02-17 DIAGNOSIS — M25611 Stiffness of right shoulder, not elsewhere classified: Secondary | ICD-10-CM | POA: Diagnosis not present

## 2022-02-17 DIAGNOSIS — M459 Ankylosing spondylitis of unspecified sites in spine: Secondary | ICD-10-CM

## 2022-02-17 DIAGNOSIS — M79643 Pain in unspecified hand: Secondary | ICD-10-CM

## 2022-02-17 DIAGNOSIS — M79641 Pain in right hand: Secondary | ICD-10-CM | POA: Diagnosis not present

## 2022-02-17 DIAGNOSIS — L819 Disorder of pigmentation, unspecified: Secondary | ICD-10-CM | POA: Diagnosis not present

## 2022-02-17 DIAGNOSIS — G8929 Other chronic pain: Secondary | ICD-10-CM

## 2022-02-17 DIAGNOSIS — M25511 Pain in right shoulder: Secondary | ICD-10-CM

## 2022-02-17 DIAGNOSIS — M79642 Pain in left hand: Secondary | ICD-10-CM | POA: Diagnosis not present

## 2022-02-17 DIAGNOSIS — M7989 Other specified soft tissue disorders: Secondary | ICD-10-CM | POA: Diagnosis not present

## 2022-02-17 MED ORDER — EPINEPHRINE 0.3 MG/0.3ML IJ SOAJ: 0.3000 mg | INTRAMUSCULAR | 2 refills | Status: DC | PRN

## 2022-02-17 NOTE — Progress Notes (Unsigned)
   Acute Office Visit  Subjective:     Patient ID: Stephanie Dyer, female    DOB: Feb 08, 1976, 46 y.o.   MRN: [id number]  Chief Complaint  Patient presents with   Referral    Rheumatology flare-up in hands & R shoulder    HPI Patient is in today for right shoulder. She had an injury of her shoulder. She has been evaluated by ortho and had an    She has a history of ank  Seen by rhematology in another state.   Stiffness, pain, limited ROM of hands in the morning.  ROS      Objective:    BP 95/62   Pulse 60   Temp 99.9 F (37.7 C) (Temporal)   Ht '5\' 5"'$  (1.651 m)   Wt 184 lb (83.5 kg)   LMP 11/04/2018   SpO2 97%   BMI 30.62 kg/m  {Vitals History (Optional):23777}  Physical Exam  No results found for any visits on 02/17/22.      Assessment & Plan:   Problem List Items Addressed This Visit       Other   Environmental allergies    No orders of the defined types were placed in this encounter.   No follow-ups on file.  Gwenlyn Perking, FNP

## 2022-02-23 LAB — C-REACTIVE PROTEIN: CRP: <1 mg/L (ref 0–10)

## 2022-02-23 LAB — RHEUMASSURE
14.3.3 ETA, Rheum. Arthritis: <0.2 ng/mL
CCP Antibodies IgG/IgA: <20 Units
Rheumatoid Arthritis Factor: <14 Units/mL

## 2022-02-23 LAB — SEDIMENTATION RATE: Sed Rate: 2 mm/hr (ref 0–32)

## 2022-03-03 ENCOUNTER — Ambulatory Visit
Admission: RE | Admit: 2022-03-03 | Discharge: 2022-03-03 | Disposition: A | Discharge status: Home / Self Care | Payer: Medicare Other | Source: Ambulatory Visit | Attending: Family Medicine | Admitting: Family Medicine

## 2022-03-03 ENCOUNTER — Other Ambulatory Visit: Payer: Self-pay | Admitting: Family Medicine

## 2022-03-03 DIAGNOSIS — Z1231 Encounter for screening mammogram for malignant neoplasm of breast: Secondary | ICD-10-CM

## 2022-03-03 DIAGNOSIS — B009 Herpesviral infection, unspecified: Secondary | ICD-10-CM

## 2022-03-18 ENCOUNTER — Encounter: Payer: Self-pay | Admitting: Radiology

## 2022-03-23 DIAGNOSIS — M545 Low back pain, unspecified: Secondary | ICD-10-CM | POA: Diagnosis not present

## 2022-03-23 DIAGNOSIS — M25511 Pain in right shoulder: Secondary | ICD-10-CM | POA: Diagnosis not present

## 2022-03-23 DIAGNOSIS — R5382 Chronic fatigue, unspecified: Secondary | ICD-10-CM | POA: Diagnosis not present

## 2022-03-23 DIAGNOSIS — M459 Ankylosing spondylitis of unspecified sites in spine: Secondary | ICD-10-CM | POA: Diagnosis not present

## 2022-03-23 DIAGNOSIS — M1991 Primary osteoarthritis, unspecified site: Secondary | ICD-10-CM | POA: Diagnosis not present

## 2022-03-23 DIAGNOSIS — M5136 Other intervertebral disc degeneration, lumbar region: Secondary | ICD-10-CM | POA: Diagnosis not present

## 2022-04-05 DIAGNOSIS — M459 Ankylosing spondylitis of unspecified sites in spine: Secondary | ICD-10-CM | POA: Diagnosis not present

## 2022-04-06 ENCOUNTER — Other Ambulatory Visit: Payer: Self-pay | Admitting: Physician Assistant

## 2022-04-13 ENCOUNTER — Other Ambulatory Visit: Payer: Self-pay | Admitting: *Deleted

## 2022-04-13 MED ORDER — RIVAROXABAN 20 MG PO TABS: 20.0000 mg | ORAL_TABLET | Freq: Every day | ORAL | 0 refills | Status: DC

## 2022-04-15 ENCOUNTER — Encounter (HOSPITAL_COMMUNITY): Payer: Self-pay | Admitting: Hematology

## 2022-05-06 ENCOUNTER — Encounter: Payer: Self-pay | Admitting: Radiology

## 2022-05-11 ENCOUNTER — Encounter (HOSPITAL_COMMUNITY): Payer: Self-pay | Admitting: Hematology

## 2022-05-12 ENCOUNTER — Encounter (HOSPITAL_COMMUNITY): Payer: Self-pay | Admitting: Hematology

## 2022-05-12 ENCOUNTER — Encounter: Payer: Self-pay | Admitting: Family Medicine

## 2022-05-12 ENCOUNTER — Ambulatory Visit (INDEPENDENT_AMBULATORY_CARE_PROVIDER_SITE_OTHER): Payer: 59 | Admitting: Family Medicine

## 2022-05-12 VITALS — BP 92/61 | HR 61 | Temp 98.2°F | Ht 65.0 in | Wt 182.2 lb

## 2022-05-12 DIAGNOSIS — M25511 Pain in right shoulder: Secondary | ICD-10-CM | POA: Diagnosis not present

## 2022-05-12 DIAGNOSIS — G8929 Other chronic pain: Secondary | ICD-10-CM | POA: Diagnosis not present

## 2022-05-12 NOTE — Progress Notes (Signed)
   Acute Office Visit  Subjective:     Patient ID: Stephanie Dyer, female    DOB: [birth year], 47 y.o.   MRN: [id number]  Chief Complaint  Patient presents with   Shoulder Pain    Shoulder Pain  The pain is present in the right shoulder. This is a chronic problem. Episode onset: 1 year ago. There has been no history of extremity trauma (after injury). The problem occurs constantly. The problem has been gradually worsening. The quality of the pain is described as aching, sharp and pounding. Associated symptoms include a limited range of motion, numbness (right fingers) and tingling (tingling). Pertinent negatives include no joint swelling. The symptoms are aggravated by activity. She has tried acetaminophen, NSAIDS, OTC ointments and rest for the symptoms. The treatment provided no relief.   Has been evaluated by [facility]. Had MRI that was normal. She has also had a steroid injection. She also did PT at home. She was previously referred back to ortho, but was lost to follow up.   Review of Systems  Musculoskeletal:  Negative for neck pain.  Neurological:  Positive for tingling (tingling) and numbness (right fingers).        Objective:    BP 92/61   Pulse 61   Temp 98.2 F (36.8 C) (Temporal)   Ht '5\' 5"'$  (1.651 m)   Wt 182 lb 4 oz (82.7 kg)   LMP 11/04/2018   SpO2 98%   BMI 30.33 kg/m    Physical Exam Vitals and nursing note reviewed.  Constitutional:      General: She is not in acute distress.    Appearance: She is not ill-appearing, toxic-appearing or diaphoretic.  Cardiovascular:     Rate and Rhythm: Normal rate and regular rhythm.     Heart sounds: Normal heart sounds. No murmur heard. Musculoskeletal:     Right shoulder: Tenderness present. No swelling, deformity, effusion, bony tenderness or crepitus. Decreased range of motion.     Cervical back: No edema, erythema or rigidity. No spinous process tenderness. Normal range of motion.  Skin:    General:  Skin is warm and dry.  Neurological:     General: No focal deficit present.     Mental Status: She is alert and oriented to person, place, and time.  Psychiatric:        Mood and Affect: Mood normal.        Behavior: Behavior normal.     No results found for any visits on 05/12/22.      Assessment & Plan:   Stephanie Dyer was seen today for shoulder pain.  Diagnoses and all orders for this visit:  Chronic right shoulder pain Gradually worsening. Referral back to [facility] for evaluation and treatment. Continue NSAIDs, rest, heat, stretching.  -     Ambulatory referral to Orthopedic Surgery   Follow up for CPE.   The patient indicates understanding of these issues and agrees with the plan.  Gwenlyn Perking, FNP

## 2022-05-17 DIAGNOSIS — M25511 Pain in right shoulder: Secondary | ICD-10-CM | POA: Diagnosis not present

## 2022-05-19 DIAGNOSIS — M25511 Pain in right shoulder: Secondary | ICD-10-CM | POA: Diagnosis not present

## 2022-05-20 ENCOUNTER — Encounter: Payer: Self-pay | Admitting: Family Medicine

## 2022-05-20 ENCOUNTER — Ambulatory Visit (HOSPITAL_COMMUNITY)
Admission: RE | Admit: 2022-05-20 | Discharge: 2022-05-20 | Disposition: A | Discharge status: Home / Self Care | Payer: 59 | Source: Ambulatory Visit | Attending: Family Medicine | Admitting: Family Medicine

## 2022-05-20 ENCOUNTER — Ambulatory Visit (INDEPENDENT_AMBULATORY_CARE_PROVIDER_SITE_OTHER): Payer: 59 | Admitting: Family Medicine

## 2022-05-20 VITALS — BP 109/66 | HR 67 | Temp 98.6°F | Ht 65.0 in | Wt 179.0 lb

## 2022-05-20 DIAGNOSIS — R4701 Aphasia: Secondary | ICD-10-CM | POA: Diagnosis not present

## 2022-05-20 DIAGNOSIS — R55 Syncope and collapse: Secondary | ICD-10-CM | POA: Insufficient documentation

## 2022-05-20 DIAGNOSIS — R278 Other lack of coordination: Secondary | ICD-10-CM | POA: Diagnosis not present

## 2022-05-20 DIAGNOSIS — R4781 Slurred speech: Secondary | ICD-10-CM

## 2022-05-20 DIAGNOSIS — E162 Hypoglycemia, unspecified: Secondary | ICD-10-CM | POA: Diagnosis not present

## 2022-05-20 LAB — BAYER DCA HB A1C WAIVED: HB A1C (BAYER DCA - WAIVED): 5 % (ref 4.8–5.6)

## 2022-05-20 NOTE — Progress Notes (Signed)
Acute Office Visit  Subjective:     Patient ID: Stephanie Dyer, female    DOB: 23-May-1975, 47 y.o.   MRN: [id number]  Chief Complaint  Patient presents with   Blood Sugar Problem    Pt states she ended up passing out last night about 9:30 , states she was unable to talk     HPI She had dinner about 6. She ate short rib grilled cheese sandwhich and ate the whole sandwich and a few ounces of beer. She then went to [facility] to have an MRI done. She didn't take any medications prior to the MRI.  She was riding in the care with a friend, his wife, and their daughter. It was around 9 that she started slurring her words, had aphasia, and became confused, felt jittery, and had palpitations. Her friend checked her blood sugar and it was 40. They witness her lose consciousness. She does not recall events after this. She was given oral glucose and it came up to 65. Her memory picks back up at this point. This morning her blood sugar was 95 fasting. She reports that she feels tired and has brain fog today. She reports that she feels off balance and has had difficulty with word finding today. She denies dizziness, chest pain, palpitations, numbness, tingling, visual disturbances, nausea, vomiting, or fever.   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   ROS As per HPI.      Objective:    BP 109/66   Pulse 67   Temp 98.6 F (37 C)   Ht '5\' 5"'$  (1.651 m)   Wt 179 lb (81.2 kg)   LMP 11/04/2018   SpO2 95%   BMI 29.79 kg/m    Physical Exam Vitals and nursing note reviewed.  Constitutional:      General: She is awake.     Appearance: Normal appearance.  HENT:     Head: Normocephalic and atraumatic.     Mouth/Throat:     Mouth: Mucous membranes are moist.     Pharynx: Oropharynx is clear. No oropharyngeal exudate or posterior oropharyngeal erythema.  Eyes:     Extraocular Movements: Extraocular movements intact.     Pupils: Pupils are equal, round, and reactive to light.   Cardiovascular:     Rate and Rhythm: Normal rate and regular rhythm.     Heart sounds: Normal heart sounds.  Pulmonary:     Effort: Pulmonary effort is normal. No respiratory distress.     Breath sounds: Normal breath sounds.  Musculoskeletal:     Cervical back: Neck supple. No rigidity.     Right lower leg: No edema.     Left lower leg: No edema.  Skin:    General: Skin is warm and dry.  Neurological:     Mental Status: She is alert and oriented to person, place, and time.     Cranial Nerves: No cranial nerve deficit, dysarthria or facial asymmetry.     Motor: No tremor.     Coordination: Finger-Nose-Finger Test abnormal. Rapid alternating movements normal.     Comments: Anomic aphasia noted  Psychiatric:        Behavior: Behavior normal. Behavior is cooperative.     No results found for any visits on 05/20/22.      Assessment & Plan:   Stephanie Dyer was seen today for blood sugar problem.  Diagnoses and all orders for this visit:  Syncope, unspecified syncope type -     CT HEAD WO CONTRAST (5MM);  Future  Hypoglycemia -     Bayer DCA Hb A1c Waived -     CBC with Differential/Platelet -     BMP8+EGFR  Aphasia -     CT HEAD WO CONTRAST (5MM); Future  Abnormal motor coordination -     CT HEAD WO CONTRAST (5MM); Future  Slurred speech -     CT HEAD WO CONTRAST (5MM); Future   Syncopal event last night with blood sugar reading of 40. Hx of reactive hypoglycemia. She reports feeling off balance today with brain fog however. Abnormal coordination on exam with abnormal point to point. Word finding difficulty is also noted. STAT CT ordered to rule out stroke. Will also check labs as above. Will notify patient of imaging report and plan of care when available.   The patient indicates understanding of these issues and agrees with the plan.  Gwenlyn Perking, FNP

## 2022-05-21 LAB — CBC WITH DIFFERENTIAL/PLATELET
Basophils Absolute: 0 10*3/uL (ref 0.0–0.2)
Basos: 0 %
EOS (ABSOLUTE): 0.1 10*3/uL (ref 0.0–0.4)
Eos: 2 %
Hematocrit: 43.3 % (ref 34.0–46.6)
Hemoglobin: 14.3 g/dL (ref 11.1–15.9)
Immature Grans (Abs): 0 10*3/uL (ref 0.0–0.1)
Immature Granulocytes: 0 %
Lymphocytes Absolute: 1.5 10*3/uL (ref 0.7–3.1)
Lymphs: 32 %
MCH: 30.1 pg (ref 26.6–33.0)
MCHC: 33 g/dL (ref 31.5–35.7)
MCV: 91 fL (ref 79–97)
Monocytes Absolute: 0.3 10*3/uL (ref 0.1–0.9)
Monocytes: 7 %
Neutrophils Absolute: 2.7 10*3/uL (ref 1.4–7.0)
Neutrophils: 59 %
Platelets: 262 10*3/uL (ref 150–450)
RBC: 4.75 x10E6/uL (ref 3.77–5.28)
RDW: 13.1 % (ref 11.7–15.4)
WBC: 4.6 10*3/uL (ref 3.4–10.8)

## 2022-05-21 LAB — BMP8+EGFR
BUN/Creatinine Ratio: 17 (ref 9–23)
BUN: 13 mg/dL (ref 6–24)
CO2: 21 mmol/L (ref 20–29)
Calcium: 8.9 mg/dL (ref 8.7–10.2)
Chloride: 112 mmol/L — ABNORMAL HIGH (ref 96–106)
Creatinine, Ser: 0.78 mg/dL (ref 0.57–1.00)
Glucose: 88 mg/dL (ref 70–99)
Potassium: 4.3 mmol/L (ref 3.5–5.2)
Sodium: 145 mmol/L — ABNORMAL HIGH (ref 134–144)
eGFR: 96 mL/min/{1.73_m2} (ref >59)

## 2022-05-24 ENCOUNTER — Telehealth: Payer: Self-pay | Admitting: Family Medicine

## 2022-05-24 DIAGNOSIS — M25511 Pain in right shoulder: Secondary | ICD-10-CM | POA: Diagnosis not present

## 2022-05-24 NOTE — Telephone Encounter (Signed)
Called patient to schedule Medicare Annual Wellness Visit (AWV). No voicemail available to leave a message.  Last date of AWV: 10/01/2020   Please schedule an appointment at any time with either Mickel Baas or [place]. .  If any questions, please contact me at [phone].  Thank you,  Colletta Maryland,  [place] Program Direct Dial ??[phone]

## 2022-05-25 ENCOUNTER — Encounter: Payer: Self-pay | Admitting: Family Medicine

## 2022-05-25 NOTE — Telephone Encounter (Signed)
Forwarding this to you.  May need referral to endo.  I had a patient with similar issues that I ended up referring.  Typically, they recommend multiple scheduled meals/ snacks to reduce hypoglycemia but you should also check and AM cortisol level for this patient.  Perhaps a freestyle libre for continuous monitoring?

## 2022-05-26 ENCOUNTER — Other Ambulatory Visit: Payer: Self-pay | Admitting: Family Medicine

## 2022-05-26 ENCOUNTER — Other Ambulatory Visit: Payer: 59

## 2022-05-26 DIAGNOSIS — E162 Hypoglycemia, unspecified: Secondary | ICD-10-CM | POA: Diagnosis not present

## 2022-05-27 ENCOUNTER — Other Ambulatory Visit: Payer: Self-pay | Admitting: Family Medicine

## 2022-05-27 DIAGNOSIS — E161 Other hypoglycemia: Secondary | ICD-10-CM

## 2022-05-27 LAB — CORTISOL-AM, BLOOD: Cortisol - AM: 10.2 ug/dL (ref 6.2–19.4)

## 2022-05-27 LAB — BMP8+EGFR
BUN/Creatinine Ratio: 13 (ref 9–23)
BUN: 10 mg/dL (ref 6–24)
CO2: 19 mmol/L — ABNORMAL LOW (ref 20–29)
Calcium: 9.1 mg/dL (ref 8.7–10.2)
Chloride: 111 mmol/L — ABNORMAL HIGH (ref 96–106)
Creatinine, Ser: 0.79 mg/dL (ref 0.57–1.00)
Glucose: 114 mg/dL — ABNORMAL HIGH (ref 70–99)
Potassium: 3.8 mmol/L (ref 3.5–5.2)
Sodium: 143 mmol/L (ref 134–144)
eGFR: 95 mL/min/{1.73_m2} (ref >59)

## 2022-05-27 MED ORDER — FREESTYLE LIBRE 3 SENSOR MISC: 12 refills | Status: DC

## 2022-06-09 ENCOUNTER — Other Ambulatory Visit: Payer: Self-pay | Admitting: Family Medicine

## 2022-06-17 DIAGNOSIS — M75121 Complete rotator cuff tear or rupture of right shoulder, not specified as traumatic: Secondary | ICD-10-CM | POA: Diagnosis not present

## 2022-06-20 DIAGNOSIS — M75120 Complete rotator cuff tear or rupture of unspecified shoulder, not specified as traumatic: Secondary | ICD-10-CM | POA: Insufficient documentation

## 2022-06-23 IMAGING — DX DG LUMBAR SPINE COMPLETE 4+V
4 series · 4 of 4 positions shown · non-contrast
Comparison: None.

CLINICAL DATA: 44-year-old female with back pain after fall.

EXAM:
LUMBAR SPINE - COMPLETE 4+ VIEW

[l-spine ap]
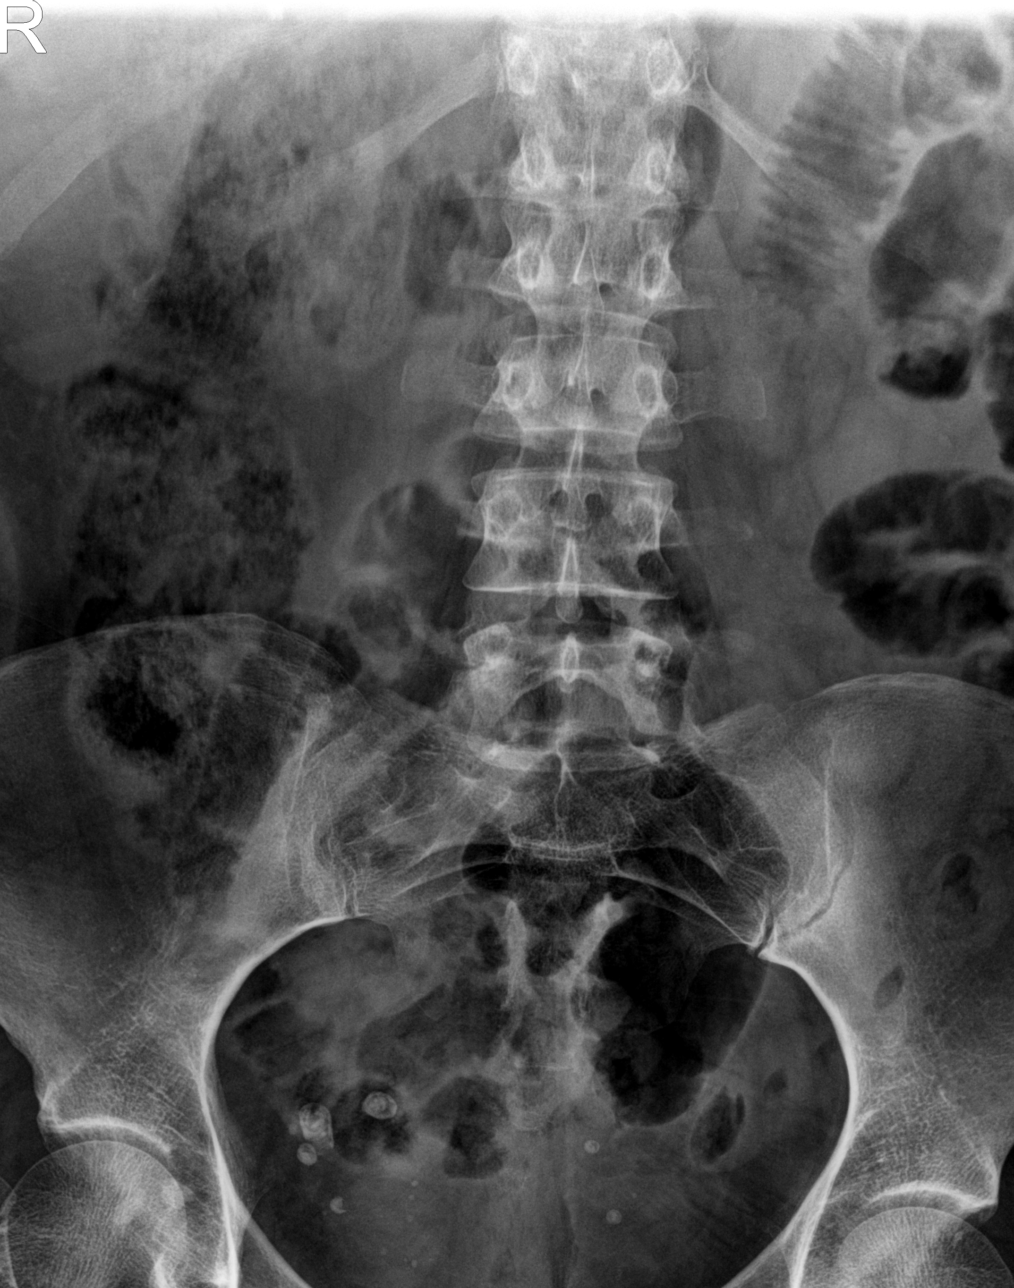

[l-spine obl (1 of 2)]
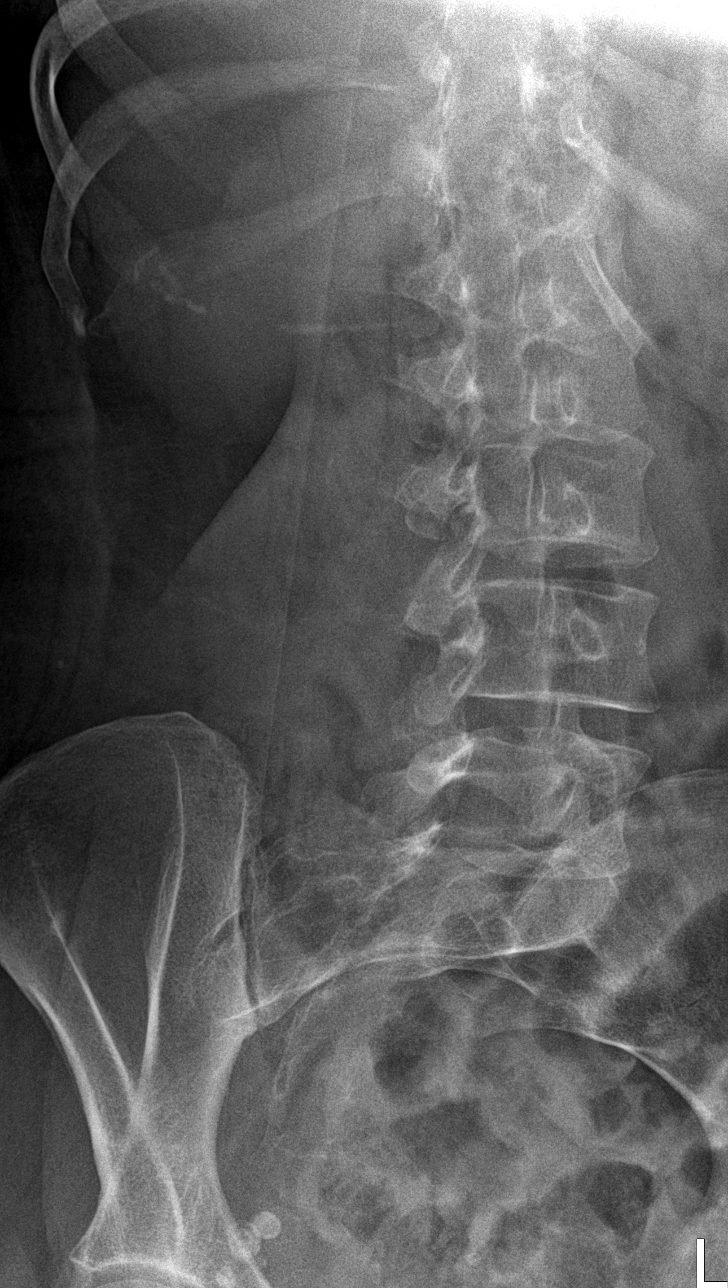

[l-spine obl (2 of 2)]
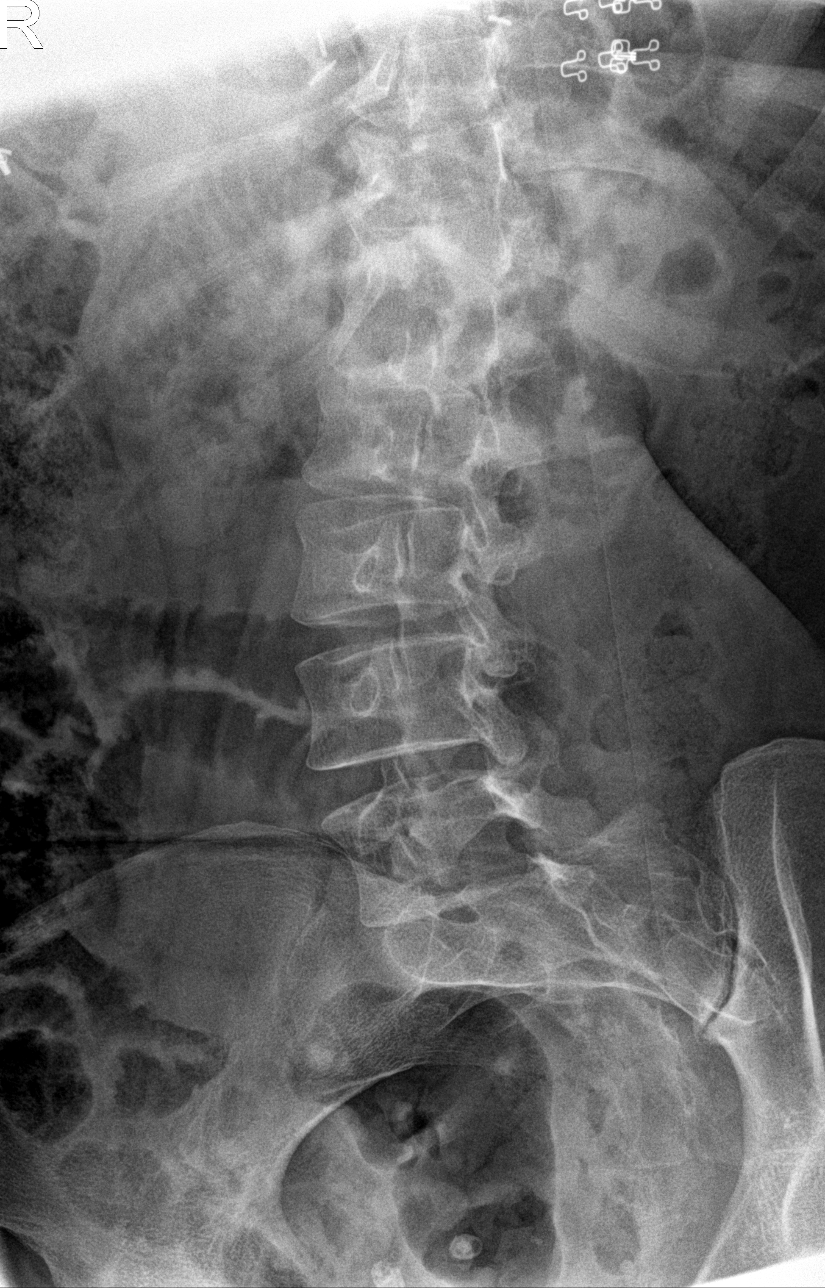

[l-spine lat]
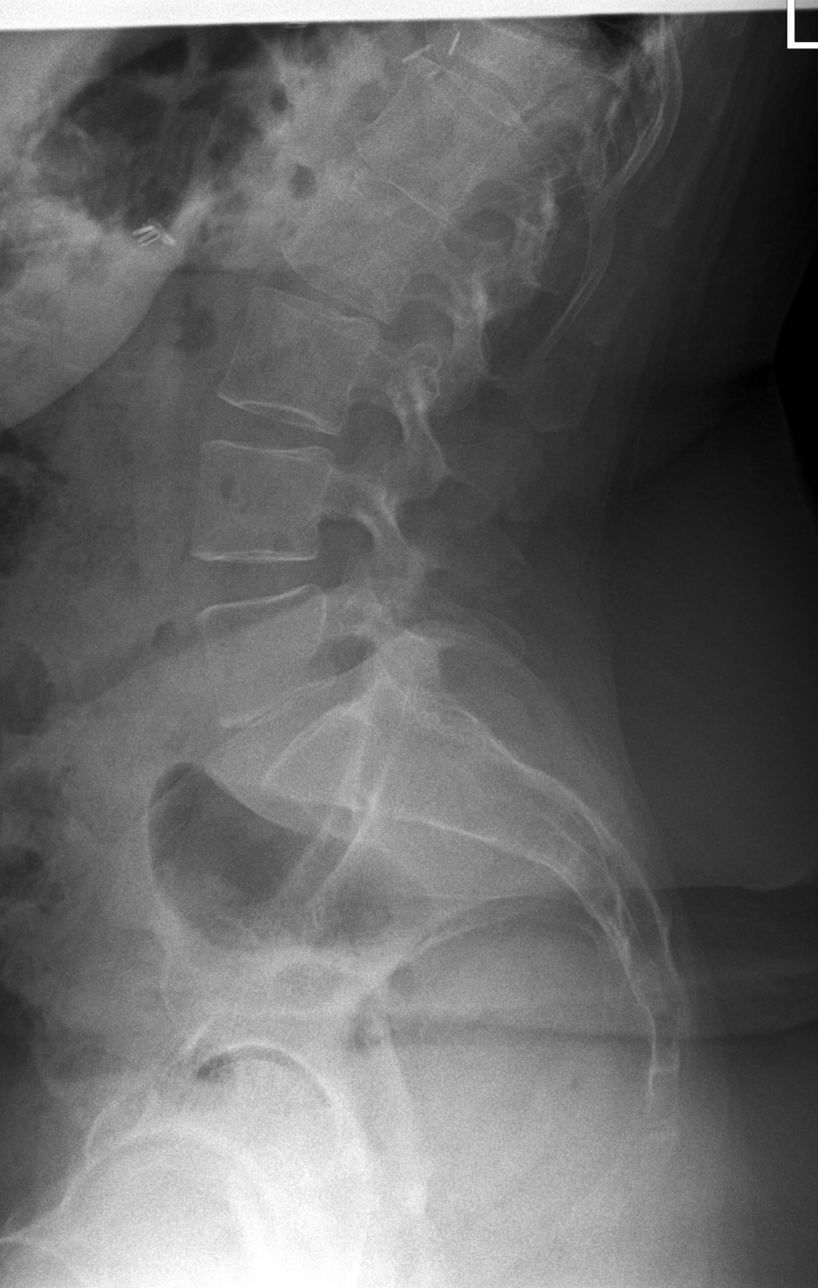

[4 of 4 positions shown; findings below may reference images not displayed]

FINDINGS: Five lumbar type vertebra. There is no acute fracture or subluxation
of the lumbar spine. The vertebral body heights and disc spaces are
maintained. The visualized posterior elements are intact. The soft
tissues are unremarkable.
IMPRESSION: Negative.

## 2022-06-24 IMAGING — CT CT T SPINE W/O CM
3 series · 9 of 33 positions shown, 11 images · non-contrast
Comparison: Thoracic spine radiographs 07/03/2020. Chest
radiographs 01/31/2020.

CLINICAL DATA: Mid back pain. Recent fall. Possible acute
compression fracture on radiographs.

EXAM:
CT THORACIC SPINE WITHOUT CONTRAST
TECHNIQUE: Multidetector CT images of the thoracic were obtained using the
standard protocol without intravenous contrast.

[Series 2: sag bone · sagittal · 0.39mm/px · 5 of 69 slices shown, 6 images]
[im 23/69  bone]
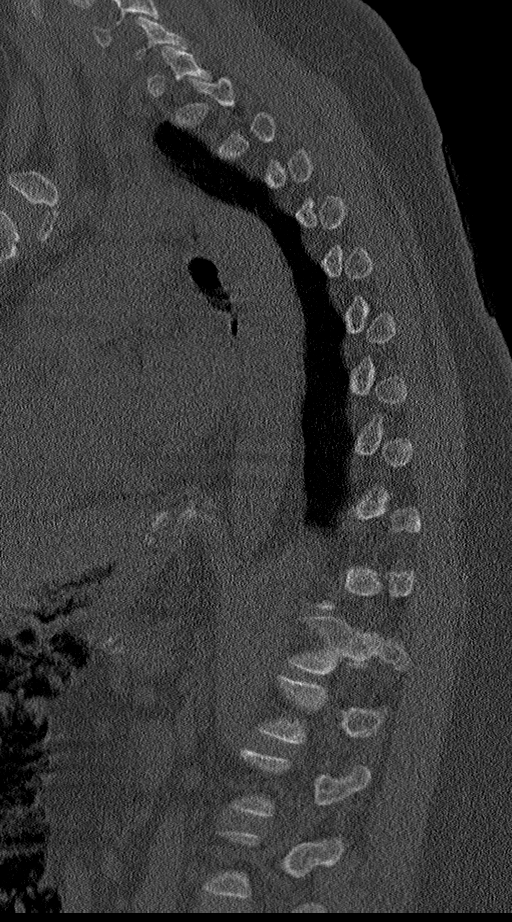
[im 29/69  bone]
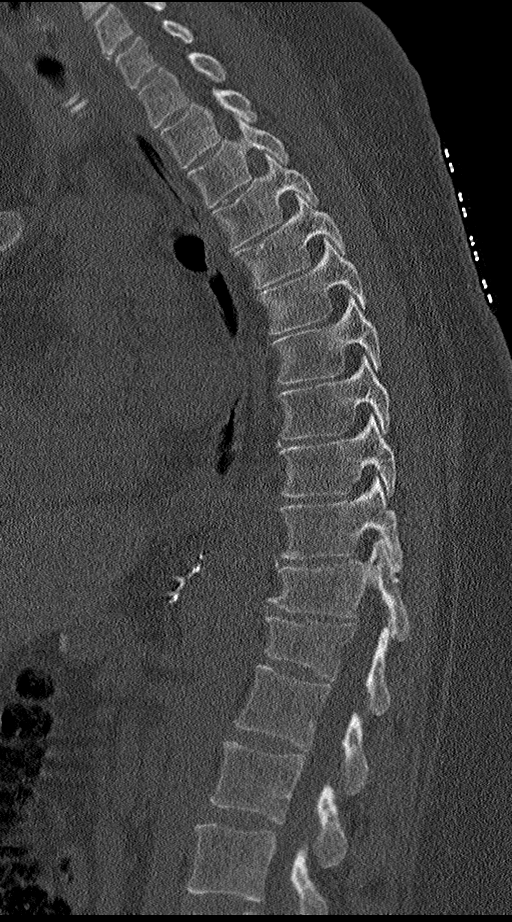
[im 35/69  soft-tissue]
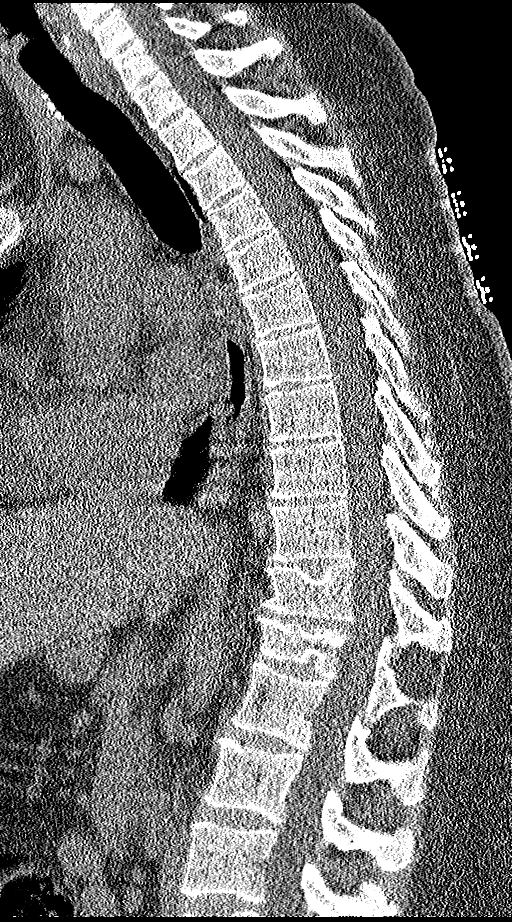
[im 35/69  bone]
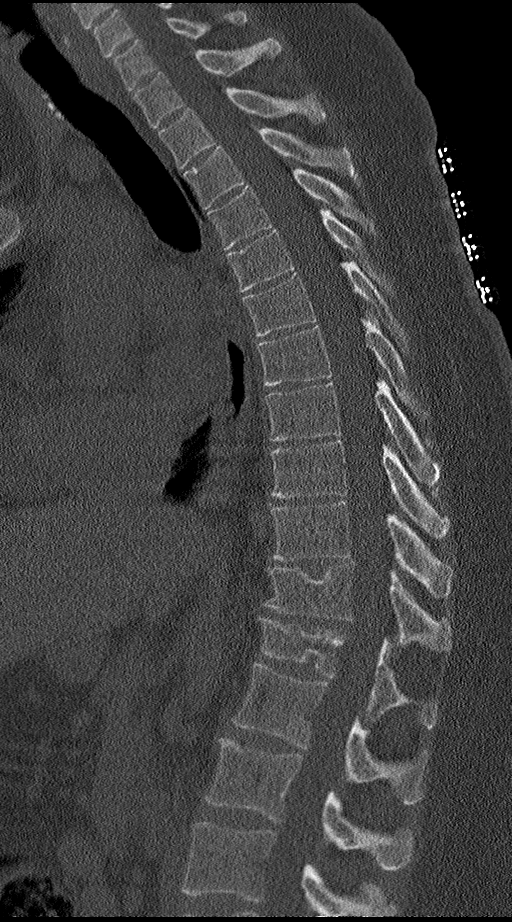
[im 40/69  bone]
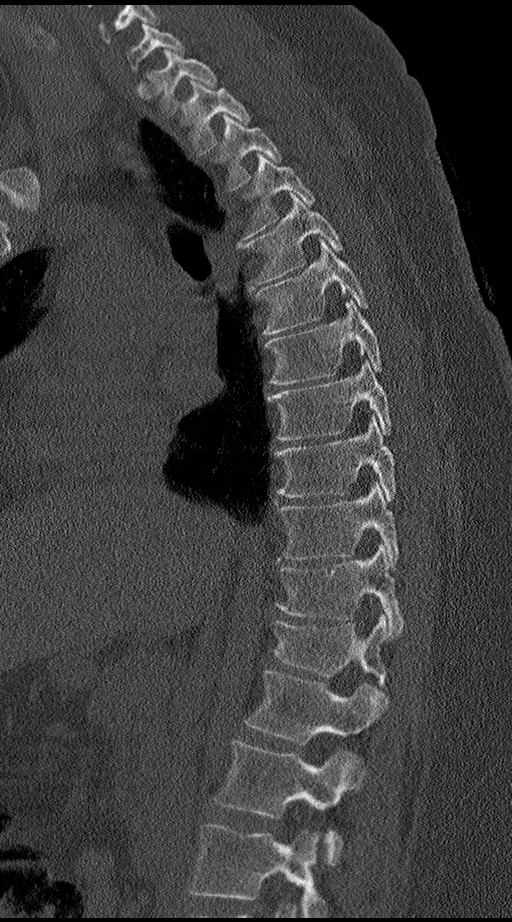
[im 46/69  bone]
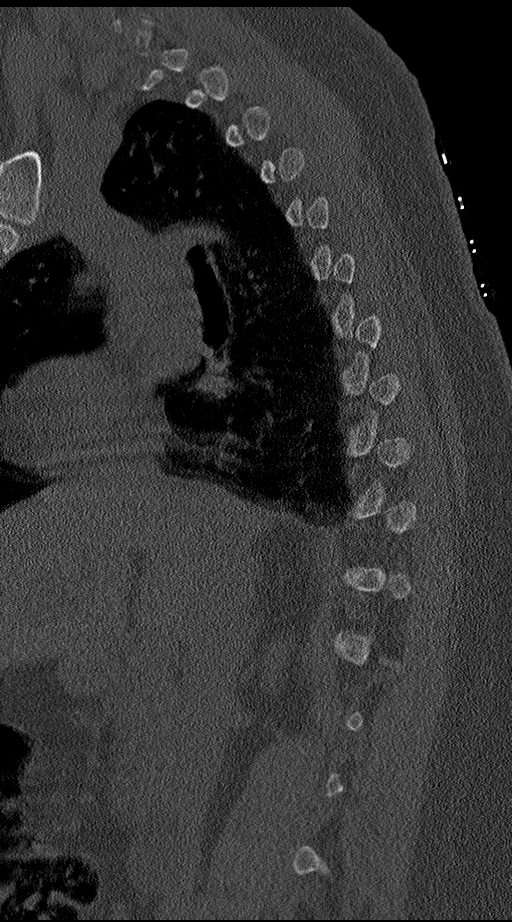

[Series 4: t spine soft · axial · 0.40mm/px · z∈[+1492,+1492]mm · 1 of 178 slices shown, 2 images]
[im 96/178  soft-tissue]
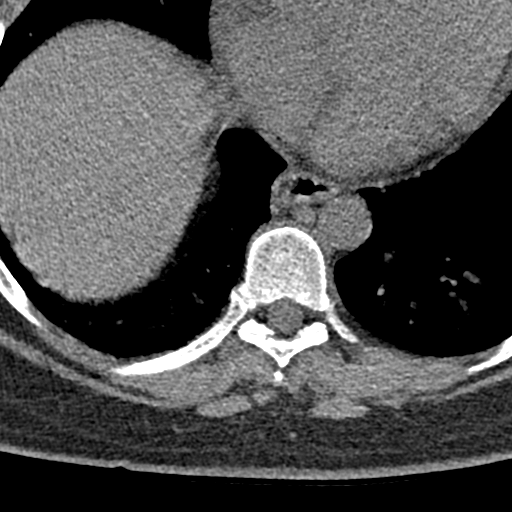
[im 96/178  bone]
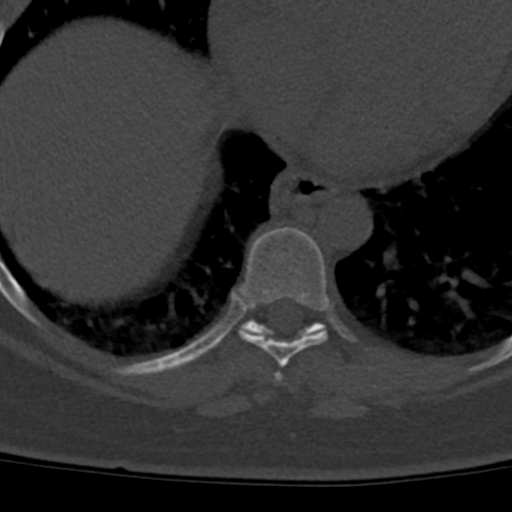

[Series 5: cor bone · coronal · 0.31mm/px · 3 of 88 slices shown]
[im 18/88  bone]
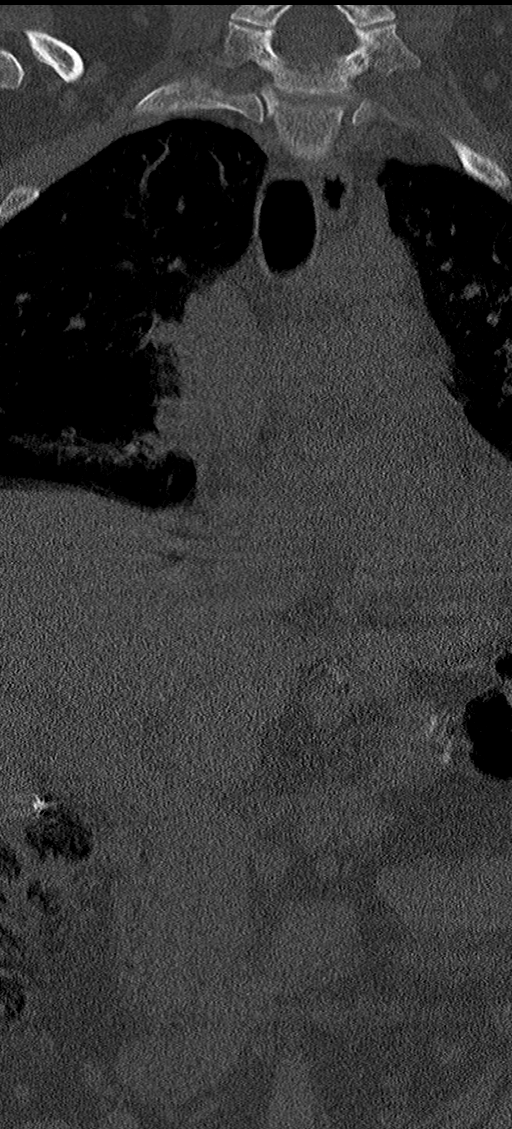
[im 35/88  bone]
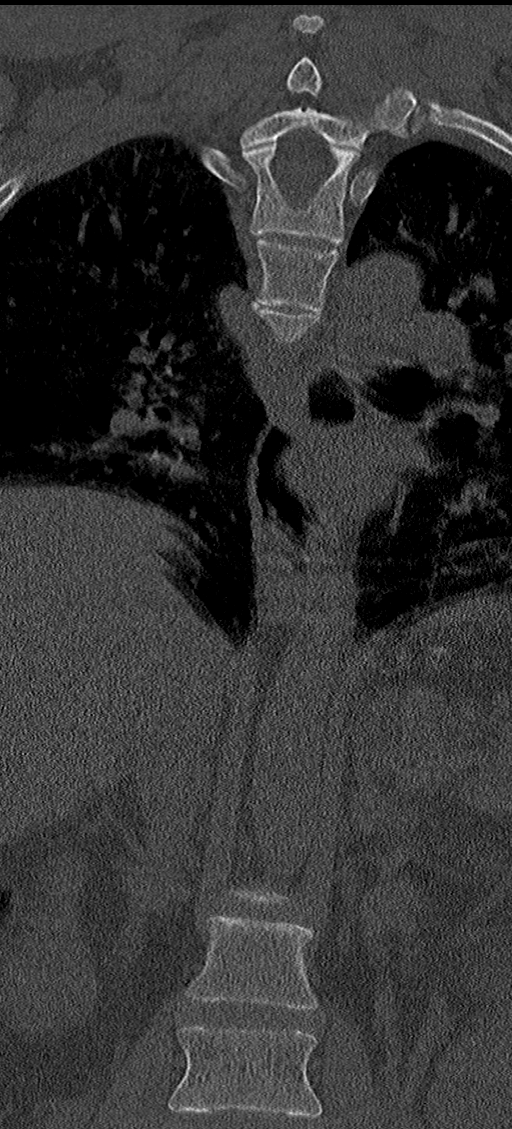
[im 53/88  bone]
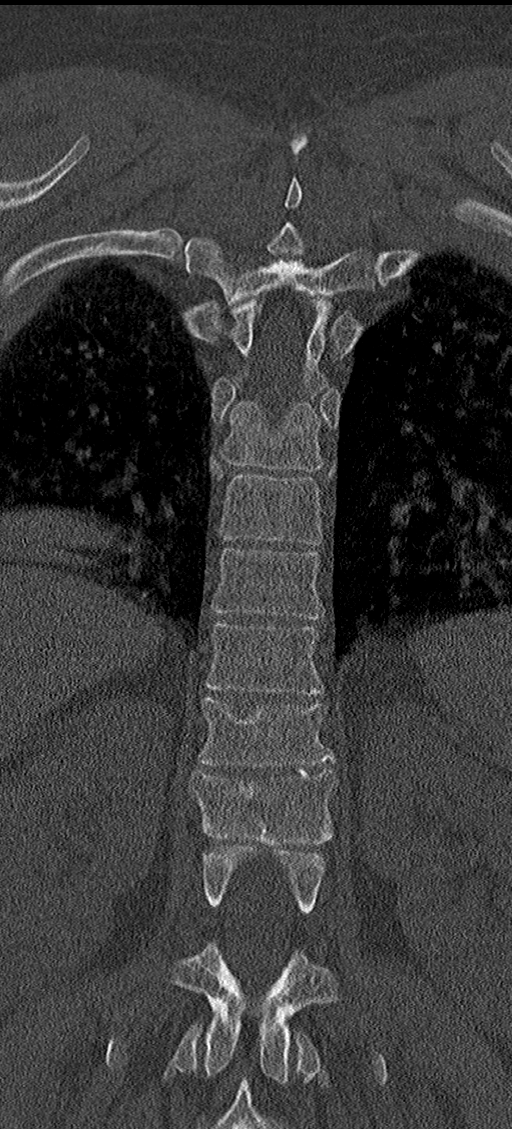

[9 of 33 positions shown; findings below may reference images not displayed]

FINDINGS: Alignment: Normal.

Vertebrae: There is an acute nondisplaced left L2 transverse process
fracture. There are T11 and T12 compression fractures with mild
height loss which are similar to the [DATE] chest radiographs and
chronic in appearance. There are scattered small Schmorl's nodes in
the thoracic spine including one involving the T5 superior endplate
which accounts for the appearance on yesterday's thoracic spine
radiographs. No acute thoracic spine fracture or suspicious osseous
lesion is identified.

Paraspinal and other soft tissues: Small nonobstructing stone in the
lower pole of the right kidney. Status post cholecystectomy and
gastric bypass.

Disc levels: Very mild thoracic spondylosis. Mild facet arthrosis at
T10-11. No evidence of compressive spinal canal or neural foraminal
stenosis.
IMPRESSION: 1. Acute nondisplaced left L2 transverse process fracture.
2. No acute fracture identified in the thoracic spine. A small
Schmorl's node at T5 accounts for the radiographic abnormality.
3. Chronic T11 and T12 compression fractures.
4. Nonobstructing right renal stone.

## 2022-06-25 ENCOUNTER — Other Ambulatory Visit: Payer: Self-pay | Admitting: Family Medicine

## 2022-06-25 ENCOUNTER — Encounter: Payer: Self-pay | Admitting: Family Medicine

## 2022-06-25 DIAGNOSIS — J301 Allergic rhinitis due to pollen: Secondary | ICD-10-CM

## 2022-06-25 MED ORDER — LEVOCETIRIZINE DIHYDROCHLORIDE 5 MG PO TABS: 5.0000 mg | ORAL_TABLET | Freq: Every evening | ORAL | 3 refills | Status: DC

## 2022-06-25 MED ORDER — MONTELUKAST SODIUM 10 MG PO TABS: 10.0000 mg | ORAL_TABLET | Freq: Every day | ORAL | 3 refills | Status: DC

## 2022-06-29 ENCOUNTER — Telehealth: Payer: Self-pay | Admitting: Family Medicine

## 2022-06-29 NOTE — Telephone Encounter (Signed)
Contacted [facility] to schedule their annual wellness visit. Appointment made for 06/30/2022.  Thank you,  Judeth Cornfield,  AMB Clinical Support [facility] AWV Program Direct Dial ??[phone]

## 2022-07-14 ENCOUNTER — Telehealth: Payer: Self-pay | Admitting: Family Medicine

## 2022-07-14 NOTE — Telephone Encounter (Signed)
Contacted [facility] to schedule their annual wellness visit. Appointment made for 07/23/2022.   Thank you,  Judeth Cornfield,  AMB Clinical Support [facility] AWV Program Direct Dial ??[phone]

## 2022-07-23 ENCOUNTER — Ambulatory Visit (INDEPENDENT_AMBULATORY_CARE_PROVIDER_SITE_OTHER): Payer: 59

## 2022-07-23 ENCOUNTER — Inpatient Hospital Stay: Payer: 59 | Attending: Physician Assistant

## 2022-07-23 VITALS — Ht 64.0 in | Wt 174.0 lb

## 2022-07-23 DIAGNOSIS — E559 Vitamin D deficiency, unspecified: Secondary | ICD-10-CM | POA: Insufficient documentation

## 2022-07-23 DIAGNOSIS — D509 Iron deficiency anemia, unspecified: Secondary | ICD-10-CM

## 2022-07-23 DIAGNOSIS — Z0000 Encounter for general adult medical examination without abnormal findings: Secondary | ICD-10-CM | POA: Diagnosis not present

## 2022-07-23 DIAGNOSIS — E611 Iron deficiency: Secondary | ICD-10-CM | POA: Insufficient documentation

## 2022-07-23 DIAGNOSIS — E538 Deficiency of other specified B group vitamins: Secondary | ICD-10-CM | POA: Insufficient documentation

## 2022-07-23 DIAGNOSIS — Z86711 Personal history of pulmonary embolism: Secondary | ICD-10-CM | POA: Insufficient documentation

## 2022-07-23 DIAGNOSIS — Z7901 Long term (current) use of anticoagulants: Secondary | ICD-10-CM | POA: Diagnosis not present

## 2022-07-23 LAB — CBC WITH DIFFERENTIAL/PLATELET
Abs Immature Granulocytes: 0.01 10*3/uL (ref 0.00–0.07)
Basophils Absolute: 0 10*3/uL (ref 0.0–0.1)
Basophils Relative: 1 %
Eosinophils Absolute: 0.1 10*3/uL (ref 0.0–0.5)
Eosinophils Relative: 3 %
HCT: 43.3 % (ref 36.0–46.0)
Hemoglobin: 14.3 g/dL (ref 12.0–15.0)
Immature Granulocytes: 0 %
Lymphocytes Relative: 32 %
Lymphs Abs: 1.4 10*3/uL (ref 0.7–4.0)
MCH: 29.8 pg (ref 26.0–34.0)
MCHC: 33 g/dL (ref 30.0–36.0)
MCV: 90.2 fL (ref 80.0–100.0)
Monocytes Absolute: 0.3 10*3/uL (ref 0.1–1.0)
Monocytes Relative: 7 %
Neutro Abs: 2.5 10*3/uL (ref 1.7–7.7)
Neutrophils Relative %: 57 %
Platelets: 220 10*3/uL (ref 150–400)
RBC: 4.8 MIL/uL (ref 3.87–5.11)
RDW: 14.2 % (ref 11.5–15.5)
WBC: 4.4 10*3/uL (ref 4.0–10.5)
nRBC: 0 % (ref 0.0–0.2)

## 2022-07-23 LAB — IRON AND TIBC
Iron: 136 ug/dL (ref 28–170)
Saturation Ratios: 41 % — ABNORMAL HIGH (ref 10.4–31.8)
TIBC: 335 ug/dL (ref 250–450)
UIBC: 199 ug/dL

## 2022-07-23 LAB — VITAMIN D 25 HYDROXY (VIT D DEFICIENCY, FRACTURES): Vit D, 25-Hydroxy: 32.39 ng/mL (ref 30–100)

## 2022-07-23 LAB — VITAMIN B12: Vitamin B-12: 215 pg/mL (ref 180–914)

## 2022-07-23 LAB — FERRITIN: Ferritin: 99 ng/mL (ref 11–307)

## 2022-07-23 NOTE — Patient Instructions (Signed)
Stephanie Dyer , Thank you for taking time to come for your Medicare Wellness Visit. I appreciate your ongoing commitment to your health goals. Please review the following plan we discussed and let me know if I can assist you in the future.   These are the goals we discussed:  Goals      AWV     10/01/2020 AWV Goal: Fall Prevention  Over the next year, patient will decrease their risk for falls by: Using assistive devices, such as a cane or walker, as needed Identifying fall risks within their home and correcting them by: Removing throw rugs Adding handrails to stairs or ramps Removing clutter and keeping a clear pathway throughout the home Increasing light, especially at night Adding shower handles/bars Raising toilet seat Identifying potential personal risk factors for falls: Medication side effects Incontinence/urgency Vestibular dysfunction Hearing loss Musculoskeletal disorders Neurological disorders Orthostatic hypotension          This is a list of the screening recommended for you and due dates:  Health Maintenance  Topic Date Due   COVID-19 Vaccine (1) 08/08/2022*   Flu Shot  10/07/2022   Mammogram  03/04/2023   Medicare Annual Wellness Visit  07/23/2023   DTaP/Tdap/Td vaccine (2 - Td or Tdap) 03/28/2025   Hepatitis C Screening: USPSTF Recommendation to screen - Ages 18-79 yo.  Completed   HIV Screening  Completed   HPV Vaccine  Aged Out  *Topic was postponed. The date shown is not the original due date.    Advanced directives: Advance directive discussed with you today. I have provided a copy for you to complete at home and have notarized. Once this is complete please bring a copy in to our office so we can scan it into your chart.   Conditions/risks identified: Aim for 30 minutes of exercise or brisk walking, 6-8 glasses of water, and 5 servings of fruits and vegetables each day.   Next appointment: Follow up in one year for your annual wellness visit.    Preventive Care 40-64 Years, Female Preventive care refers to lifestyle choices and visits with your health care provider that can promote health and wellness. What does preventive care include? A yearly physical exam. This is also called an annual well check. Dental exams once or twice a year. Routine eye exams. Ask your health care provider how often you should have your eyes checked. Personal lifestyle choices, including: Daily care of your teeth and gums. Regular physical activity. Eating a healthy diet. Avoiding tobacco and drug use. Limiting alcohol use. Practicing safe sex. Taking low-dose aspirin daily starting at age 67. Taking vitamin and mineral supplements as recommended by your health care provider. What happens during an annual well check? The services and screenings done by your health care provider during your annual well check will depend on your age, overall health, lifestyle risk factors, and family history of disease. Counseling  Your health care provider may ask you questions about your: Alcohol use. Tobacco use. Drug use. Emotional well-being. Home and relationship well-being. Sexual activity. Eating habits. Work and work Astron[employer]. Method of birth control. Menstrual cycle. Pregnancy history. Screening  You may have the following tests or measurements: Height, weight, and BMI. Blood pressure. Lipid and cholesterol levels. These may be checked every 5 years, or more frequently if you are over 24 years old. Skin check. Lung cancer screening. You may have this screening every year starting at age 33 if you have a 30-pack-year history of smoking and currently smoke  or have quit within the past 15 years. Fecal occult blood test (FOBT) of the stool. You may have this test every year starting at age 70. Flexible sigmoidoscopy or colonoscopy. You may have a sigmoidoscopy every 5 years or a colonoscopy every 10 years starting at age 17. Hepatitis C blood  test. Hepatitis B blood test. Sexually transmitted disease (STD) testing. Diabetes screening. This is done by checking your blood sugar (glucose) after you have not eaten for a while (fasting). You may have this done every 1-3 years. Mammogram. This may be done every 1-2 years. Talk to your health care provider about when you should start having regular mammograms. This may depend on whether you have a family history of breast cancer. BRCA-related cancer screening. This may be done if you have a family history of breast, ovarian, tubal, or peritoneal cancers. Pelvic exam and Pap test. This may be done every 3 years starting at age 52. Starting at age 31, this may be done every 5 years if you have a Pap test in combination with an HPV test. Bone density scan. This is done to screen for osteoporosis. You may have this scan if you are at high risk for osteoporosis. Discuss your test results, treatment options, and if necessary, the need for more tests with your health care provider. Vaccines  Your health care provider may recommend certain vaccines, such as: Influenza vaccine. This is recommended every year. Tetanus, diphtheria, and acellular pertussis (Tdap, Td) vaccine. You may need a Td booster every 10 years. Zoster vaccine. You may need this after age 74. Pneumococcal 13-valent conjugate (PCV13) vaccine. You may need this if you have certain conditions and were not previously vaccinated. Pneumococcal polysaccharide (PPSV23) vaccine. You may need one or two doses if you smoke cigarettes or if you have certain conditions. Talk to your health care provider about which screenings and vaccines you need and how often you need them. This information is not intended to replace advice given to you by your health care provider. Make sure you discuss any questions you have with your health care provider. Document Released: 03/21/2015 Document Revised: 11/12/2015 Document Reviewed: 12/24/2014 Elsevier  Interactive Patient Education  2017 [employer].    Fall Prevention in the Home Falls can cause injuries. They can happen to people of all ages. There are many things you can do to make your home safe and to help prevent falls. What can I do on the outside of my home? Regularly fix the edges of walkways and driveways and fix any cracks. Remove anything that might make you trip as you walk through a door, such as a raised step or threshold. Trim any bushes or trees on the path to your home. Use bright outdoor lighting. Clear any walking paths of anything that might make someone trip, such as rocks or tools. Regularly check to see if handrails are loose or broken. Make sure that both sides of any steps have handrails. Any raised decks and porches should have guardrails on the edges. Have any leaves, snow, or ice cleared regularly. Use sand or salt on walking paths during winter. Clean up any spills in your garage right away. This includes oil or grease spills. What can I do in the bathroom? Use night lights. Install grab bars by the toilet and in the tub and shower. Do not use towel bars as grab bars. Use non-skid mats or decals in the tub or shower. If you need to sit down in the shower,  use a plastic, non-slip stool. Keep the floor dry. Clean up any water that spills on the floor as soon as it happens. Remove soap buildup in the tub or shower regularly. Attach bath mats securely with double-sided non-slip rug tape. Do not have throw rugs and other things on the floor that can make you trip. What can I do in the bedroom? Use night lights. Make sure that you have a light by your bed that is easy to reach. Do not use any sheets or blankets that are too big for your bed. They should not hang down onto the floor. Have a firm chair that has side arms. You can use this for support while you get dressed. Do not have throw rugs and other things on the floor that can make you trip. What can I  do in the kitchen? Clean up any spills right away. Avoid walking on wet floors. Keep items that you use a lot in easy-to-reach places. If you need to reach something above you, use a strong step stool that has a grab bar. Keep electrical cords out of the way. Do not use floor polish or wax that makes floors slippery. If you must use wax, use non-skid floor wax. Do not have throw rugs and other things on the floor that can make you trip. What can I do with my stairs? Do not leave any items on the stairs. Make sure that there are handrails on both sides of the stairs and use them. Fix handrails that are broken or loose. Make sure that handrails are as long as the stairways. Check any carpeting to make sure that it is firmly attached to the stairs. Fix any carpet that is loose or worn. Avoid having throw rugs at the top or bottom of the stairs. If you do have throw rugs, attach them to the floor with carpet tape. Make sure that you have a light switch at the top of the stairs and the bottom of the stairs. If you do not have them, ask someone to add them for you. What else can I do to help prevent falls? Wear shoes that: Do not have high heels. Have rubber bottoms. Are comfortable and fit you well. Are closed at the toe. Do not wear sandals. If you use a stepladder: Make sure that it is fully opened. Do not climb a closed stepladder. Make sure that both sides of the stepladder are locked into place. Ask someone to hold it for you, if possible. Clearly mark and make sure that you can see: Any grab bars or handrails. First and last steps. Where the edge of each step is. Use tools that help you move around (mobility aids) if they are needed. These include: Canes. Walkers. Scooters. Crutches. Turn on the lights when you go into a dark area. Replace any light bulbs as soon as they burn out. Set up your furniture so you have a clear path. Avoid moving your furniture around. If any of your  floors are uneven, fix them. If there are any pets around you, be aware of where they are. Review your medicines with your doctor. Some medicines can make you feel dizzy. This can increase your chance of falling. Ask your doctor what other things that you can do to help prevent falls. This information is not intended to replace advice given to you by your health care provider. Make sure you discuss any questions you have with your health care provider. Document Released: 12/19/2008 Document  Revised: 07/31/2015 Document Reviewed: 03/29/2014 Elsevier Interactive Patient Education  2017 [employer].

## 2022-07-23 NOTE — Progress Notes (Signed)
Subjective:   Stephanie Dyer is a 47 y.o. female who presents for Medicare Annual (Subsequent) preventive examination. I connected with  SHERRAY SWICKARD on 07/23/22 by a audio enabled telemedicine application and verified that I am speaking with the correct person using two identifiers.  Patient Location: Home  Provider Location: Home Office  I discussed the limitations of evaluation and management by telemedicine. The patient expressed understanding and agreed to proceed.  Review of Systems     Cardiac Risk Factors include: none     Objective:    Today's Vitals   07/23/22 1407  Weight: 174 lb (78.9 kg)  Height: 5\' 4"  (1.626 m)   Body mass index is 29.87 kg/m.     07/23/2022    2:10 PM 01/19/2022    9:01 AM 04/06/2021    3:56 PM 03/24/2021    8:10 AM 11/28/2020    9:38 AM 11/21/2020    8:16 AM 11/13/2020    8:38 AM  Advanced Directives  Does Patient Have a Medical Advance Directive? Yes No Yes Yes No No No  Type of Estate agent of [place];Living will  Living will;Healthcare Power of [employer] Power of [place];Living will     Does patient want to make changes to medical advance directive?   No - Patient declined No - Patient declined     Copy of Healthcare Power of Attorney in Chart? No - copy requested   No - copy requested     Would patient like information on creating a medical advance directive?  No - Patient declined No - Patient declined  No - Patient declined No - Patient declined No - Patient declined    Current Medications (verified) Outpatient Encounter Medications as of 07/23/2022  Medication Sig   albuterol (VENTOLIN HFA) 108 (90 Base) MCG/ACT inhaler Inhale 2 puffs into the lungs every 6 (six) hours as needed.   B-D 3CC LUER-LOK SYR 25GX1" 25G X 1" 3 ML MISC Use monthly for B12 injections   Cholecalciferol (VITAMIN D-3) 125 MCG (5000 UT) TABS Take 1 tablet by mouth 2 (two) times daily.   Continuous Blood Gluc  Sensor (FREESTYLE LIBRE 3 SENSOR) MISC Place 1 sensor on the skin every 14 days. Use to check glucose continuously   cyanocobalamin (VITAMIN B12) 1000 MCG/ML injection Inject 1 mL (1,000 mcg total) into the muscle every 30 (thirty) days.   EPINEPHrine 0.3 mg/0.3 mL IJ SOAJ injection Inject 0.3 mg into the muscle as needed.   FLUoxetine (PROZAC) 20 MG capsule Take 1 capsule by mouth once daily   fluticasone (FLONASE) 50 MCG/ACT nasal spray Place 2 sprays into both nostrils daily.   levocetirizine (XYZAL) 5 MG tablet Take 1 tablet (5 mg total) by mouth every evening.   montelukast (SINGULAIR) 10 MG tablet Take 1 tablet (10 mg total) by mouth at bedtime.   Needles & Syringes MISC Injection supplies for monthly vitamin B12 injections x 6 months with 1 refill.   pantoprazole (PROTONIX) 40 MG tablet Take 1 tablet (40 mg total) by mouth daily. (NEEDS TO BE SEEN BEFORE NEXT REFILL)   rivaroxaban (XARELTO) 20 MG TABS tablet Take 1 tablet (20 mg total) by mouth daily with supper.   topiramate (TOPAMAX) 50 MG tablet TAKE 1 & 1/2 (ONE & ONE-HALF) TABLETS BY MOUTH ONCE DAILY   No facility-administered encounter medications on file as of 07/23/2022.    Allergies (verified) Codeine, Morphine, Other, Fioricet [butalbital-apap-caffeine], Nsaids, Vancomycin, Venlafaxine, Zolpidem, Latex, Peanut-containing drug products,  and Shellfish allergy   History: Past Medical History:  Diagnosis Date   Angio-edema    Ankylosing spondylitis (HCC)    Anxiety    Arthritis    Asthma    Cervical cancer (HCC) 1998   Elevated LFTs    Environmental allergies    GERD (gastroesophageal reflux disease)    History of gastric bypass    History of iron deficiency anemia    Hypoglycemia    Kidney stones    Lumbar back pain    Migraines    PCOS (polycystic ovarian syndrome)    Pseudotumor cerebri 2006   Thyroid cyst    Urticaria    Vitamin B12 deficiency 03/31/2020   Vitamin D deficiency    Past Surgical History:   Procedure Laterality Date   ADENOIDECTOMY     CESAREAN SECTION  2016   CHOLECYSTECTOMY  1998   COSMETIC SURGERY  2015   GASTRIC BYPASS  2014   KNEE SURGERY Left 2001   LITHOTRIPSY  2021   TONSILLECTOMY     TOTAL ABDOMINAL HYSTERECTOMY  10/2018   Family History  Problem Relation Age of Onset   Alcohol abuse Mother    Diabetes Mother    COPD Mother    Emphysema Mother    Asthma Mother    Heart disease Father    Heart attack Father    CVA Brother    Diabetes Brother    Hypertension Brother    Autism Son    Hearing loss Son    Diabetes Maternal Grandmother    Parkinson's disease Maternal Grandmother    Macular degeneration Maternal Grandmother    Thyroid cancer Maternal Grandmother    Stomach cancer Maternal Grandmother    Cervical cancer Maternal Grandmother    Migraines Maternal Grandmother    Suicidality Maternal Grandfather    Allergic rhinitis Neg Hx    Eczema Neg Hx    Urticaria Neg Hx    Breast cancer Neg Hx    Social History   Socioeconomic History   Marital status: Legally Separated    Spouse name: Not on file   Number of children: 2   Years of education: Not on file   Highest education level: Not on file  Occupational History   Not on file  Tobacco Use   Smoking status: Former    Types: Cigarettes    Quit date: 11/27/2012    Years since quitting: 9.6   Smokeless tobacco: Never  Vaping Use   Vaping Use: Every day  Substance and Sexual Activity   Alcohol use: Not Currently   Drug use: Not Currently    Types: Marijuana   Sexual activity: Yes    Birth control/protection: Other-see comments  Other Topics Concern   Not on file  Social History Narrative   Makayla- 74, Alex- 7   Social Determinants of Health   Financial Resource Strain: Low Risk  (07/23/2022)   Overall Financial Resource Strain (CARDIA)    Difficulty of Paying Living Expenses: Not hard at all  Food Insecurity: No Food Insecurity (07/23/2022)   Hunger Vital Sign    Worried About  Running Out of Food in the Last Year: Never true    Ran Out of Food in the Last Year: Never true  Transportation Needs: No Transportation Needs (07/23/2022)   PRAPARE - Administrator, Civil Service (Medical): No    Lack of Transportation (Non-Medical): No  Physical Activity: Insufficiently Active (07/23/2022)   Exercise Vital Sign  Days of Exercise per Week: 2 days    Minutes of Exercise per Session: 30 min  Stress: No Stress Concern Present (07/23/2022)   [employer] of Occupational Health - Occupational Stress Questionnaire    Feeling of Stress : Not at all  Social Connections: Moderately Integrated (07/23/2022)   Social Connection and Isolation Panel [NHANES]    Frequency of Communication with Friends and Family: More than three times a week    Frequency of Social Gatherings with Friends and Family: More than three times a week    Attends Religious Services: More than 4 times per year    Active Member of [employer] or Organizations: Yes    Attends Engineer, structural: More than 4 times per year    Marital Status: Divorced    Tobacco Counseling Counseling given: Not Answered   Clinical Intake:  Pre-visit preparation completed: Yes  Pain : No/denies pain     Nutritional Risks: None Diabetes: No  How often do you need to have someone help you when you read instructions, pamphlets, or other written materials from your doctor or pharmacy?: 1 - Never  Diabetic?no   Interpreter Needed?: No  Information entered by :: Renie Ora, LPN   Activities of Daily Living    07/23/2022    2:10 PM  In your present state of health, do you have any difficulty performing the following activities:  Hearing? 0  Vision? 0  Difficulty concentrating or making decisions? 0  Walking or climbing stairs? 0  Dressing or bathing? 0  Doing errands, shopping? 0  Preparing Food and eating ? N  Using the Toilet? N  In the past six months, have you accidently leaked  urine? N  Do you have problems with loss of bowel control? N  Managing your Medications? N  Managing your Finances? N  Housekeeping or managing your Housekeeping? N    Patient Care Team: Gabriel Earing, FNP as PCP - General (Family Medicine) Lanelle Bal, DO as Consulting Physician (Internal Medicine)  Indicate any recent Medical Services you may have received from other than [facility] providers in the past year (date may be approximate).     Assessment:   This is a routine wellness examination for [place].  Hearing/Vision screen Vision Screening - Comments:: Wears rx glasses - up to date with routine eye exams with  Dr.Johnson   Dietary issues and exercise activities discussed: Current Exercise Habits: Home exercise routine, Type of exercise: walking, Time (Minutes): 30, Frequency (Times/Week): 3, Weekly Exercise (Minutes/Week): 90, Intensity: Mild, Exercise limited by: None identified   Goals Addressed             This Visit's Progress    AWV   On track    10/01/2020 AWV Goal: Fall Prevention  Over the next year, patient will decrease their risk for falls by: Using assistive devices, such as a cane or walker, as needed Identifying fall risks within their home and correcting them by: Removing throw rugs Adding handrails to stairs or ramps Removing clutter and keeping a clear pathway throughout the home Increasing light, especially at night Adding shower handles/bars Raising toilet seat Identifying potential personal risk factors for falls: Medication side effects Incontinence/urgency Vestibular dysfunction Hearing loss Musculoskeletal disorders Neurological disorders Orthostatic hypotension         Depression Screen    05/12/2022    1:01 PM 02/17/2022    1:20 PM 11/03/2021    1:31 PM 09/10/2021    1:57 PM 07/03/2021  10:07 AM 04/24/2021    8:07 AM 04/15/2021   11:00 AM  PHQ 2/9 Scores  PHQ - 2 Score 0 0 0 0 0 0 0  PHQ- 9 Score 8 10 10 6 5  0 0    Fall  Risk    07/23/2022    2:08 PM 05/12/2022    1:01 PM 02/17/2022    1:20 PM 11/03/2021    1:31 PM 09/10/2021    1:57 PM  Fall Risk   Falls in the past year? 0 0 1 1 0  Number falls in past yr: 0  0 0   Injury with Fall? 0  1 1   Risk for fall due to : No Fall Risks  History of fall(s)    Follow up Falls prevention discussed  Falls evaluation completed Falls prevention discussed     FALL RISK PREVENTION PERTAINING TO THE HOME:  Any stairs in or around the home? No  If so, are there any without handrails? No  Home free of loose throw rugs in walkways, pet beds, electrical cords, etc? No  Adequate lighting in your home to reduce risk of falls? No   ASSISTIVE DEVICES UTILIZED TO PREVENT FALLS:  Life alert? No  Use of a cane, walker or w/c? No  Grab bars in the bathroom? Yes  Shower chair or bench in shower? Yes  Elevated toilet seat or a handicapped toilet? Yes       07/23/2022    2:11 PM 10/01/2020   10:03 AM  6CIT Screen  What Year? 0 points 0 points  What month? 0 points 0 points  What time? 0 points 0 points  Count back from 20 0 points 0 points  Months in reverse 0 points 0 points  Repeat phrase 0 points 0 points  Total Score 0 points 0 points    Immunizations Immunization History  Administered Date(s) Administered   Influenza-Unspecified 01/05/2021   Tdap 03/29/2015    TDAP status: Up to date  Flu Vaccine status: Declined, Education has been provided regarding the importance of this vaccine but patient still declined. Advised may receive this vaccine at local pharmacy or Health Dept. Aware to provide a copy of the vaccination record if obtained from local pharmacy or Health Dept. Verbalized acceptance and understanding.  Pneumococcal vaccine status: Declined,  Education has been provided regarding the importance of this vaccine but patient still declined. Advised may receive this vaccine at local pharmacy or Health Dept. Aware to provide a copy of the vaccination  record if obtained from local pharmacy or Health Dept. Verbalized acceptance and understanding.   Covid-19 vaccine status: Declined, Education has been provided regarding the importance of this vaccine but patient still declined. Advised may receive this vaccine at local pharmacy or Health Dept.or vaccine clinic. Aware to provide a copy of the vaccination record if obtained from local pharmacy or Health Dept. Verbalized acceptance and understanding.  Qualifies for Shingles Vaccine? No   Zostavax completed No   Shingrix Completed?: No.    Education has been provided regarding the importance of this vaccine. Patient has been advised to call insurance company to determine out of pocket expense if they have not yet received this vaccine. Advised may also receive vaccine at local pharmacy or Health Dept. Verbalized acceptance and understanding.  Screening Tests Health Maintenance  Topic Date Due   COVID-19 Vaccine (1) 08/08/2022 (Originally 01/23/1976)   INFLUENZA VACCINE  10/07/2022   MAMMOGRAM  03/04/2023   Medicare Annual Wellness (AWV)  07/23/2023   DTaP/Tdap/Td (2 - Td or Tdap) 03/28/2025   Hepatitis C Screening  Completed   HIV Screening  Completed   HPV VACCINES  Aged Out    Health Maintenance  There are no preventive care reminders to display for this patient.   Colorectal cancer screening: No longer required.   Mammogram status: Completed 03/03/2022. Repeat every year  Bone Density status: Ordered not of age . Pt provided with contact info and advised to call to schedule appt.  Lung Cancer Screening: (Low Dose CT Chest recommended if Age 40-80 years, 30 pack-year currently smoking OR have quit w/in 15years.) does not qualify.   Lung Cancer Screening Referral: n/a  Additional Screening:  Hepatitis C Screening: does not qualify;   Vision Screening: Recommended annual ophthalmology exams for early detection of glaucoma and other disorders of the eye. Is the patient up to  date with their annual eye exam?  Yes  Who is the provider or what is the name of the office in which the patient attends annual eye exams? Dr.Johnson  If pt is not established with a provider, would they like to be referred to a provider to establish care? No .   Dental Screening: Recommended annual dental exams for proper oral hygiene  Community Resource Referral / Chronic Care Management: CRR required this visit?  No   CCM required this visit?  No      Plan:     I have personally reviewed and noted the following in the patient's chart:   Medical and social history Use of alcohol, tobacco or illicit drugs  Current medications and supplements including opioid prescriptions. Patient is not currently taking opioid prescriptions. Functional ability and status Nutritional status Physical activity Advanced directives List of other physicians Hospitalizations, surgeries, and ER visits in previous 12 months Vitals Screenings to include cognitive, depression, and falls Referrals and appointments  In addition, I have reviewed and discussed with patient certain preventive protocols, quality metrics, and best practice recommendations. A written personalized care plan for preventive services as well as general preventive health recommendations were provided to patient.     Lorrene Reid, LPN   [id number]/61/0960   Nurse Notes: none

## 2022-07-26 LAB — METHYLMALONIC ACID, SERUM: Methylmalonic Acid, Quantitative: 483 nmol/L — ABNORMAL HIGH (ref 0–378)

## 2022-07-29 ENCOUNTER — Other Ambulatory Visit: Payer: Self-pay | Admitting: Physician Assistant

## 2022-07-29 NOTE — Progress Notes (Signed)
VIRTUAL VISIT via TELEPHONE NOTE [facility]   I connected with Stephanie Dyer  on 07/30/22 at 9:11 AM by telephone and verified that I am speaking with the correct person using two identifiers.  Location: Patient: Home Provider: [facility]   I discussed the limitations, risks, security and privacy concerns of performing an evaluation and management service by telephone and the availability of in person appointments. I also discussed with the patient that there may be a patient responsible charge related to this service. The patient expressed understanding and agreed to proceed.  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 is contacted today for follow-up of her iron deficiency and her history of DVT/PE x2, on chronic anticoagulation.  She was last evaluated via telemedicine visit by Rojelio Brenner PA-C on 01/19/2022.   She currently reports ongoing chronic fatigue and poor appetite.  She denies any pica, restless legs, shortness of breath, chest pain, headaches, lightheadedness, or syncope.   She remains on chronic Xarelto due to her recurrent unprovoked PE.  No symptoms of DVT such as unilateral leg swelling, pain, and erythema.  No symptoms of PE such as new shortness of breath, chest pain, cough, hemoptysis, or palpitations.  She has not had any major bleeding events while on Xarelto, but does report easy bruising and that her gums bleed when she brushes her teeth.  No epistaxis, hematuria, hematemesis, hematochezia, or melena.   She reports 25% energy and 50% appetite.  She reports that she is maintaining a stable weight at this time.  REVIEW OF SYSTEMS:   Review of Systems  Constitutional:  Positive for malaise/fatigue. Negative for chills, diaphoresis, fever and weight loss.   Respiratory:  Negative for cough and shortness of breath.   Cardiovascular:  Negative for chest pain and palpitations.  Gastrointestinal:  Negative for abdominal pain, blood in stool, melena, nausea and vomiting.  Neurological:  Negative for dizziness and headaches.  Psychiatric/Behavioral:  The patient is nervous/anxious and has insomnia.      PHYSICAL EXAM: (per limitations of virtual telephone visit)  The patient is alert and oriented x 3, exhibiting adequate mentation, good mood, and ability to speak in full sentences and execute sound judgement.  ASSESSMENT & PLAN:  1.  Recurrent PE, on chronic anticoagulation - Initial pulmonary embolism in 2008, provoked in the setting of hospitalization for bronchitis/pneumonia - Second pulmonary embolism June 2022 was diagnosed in ED at [facility] after patient presented with complaints of left-sided chest pain and LLE pain - CTA chest (08/16/2020) showed age-indeterminate subsegmental PE but no evidence of acute PE.  - Venous duplex ultrasound of bilateral lower extremities (08/29/2020): Negative for DVT - Patient reports a family history of blood clots, but no family members with definitive diagnosis of hypercoagulable disorder - Hypercoagulable work-up (08/29/2020) was unremarkable  - She is not on any oral birth control - Recommended indefinite anticoagulation due to recurrent pulmonary embolism, which appears to be unprovoked - Patient understands risks of anticoagulation including increased risk of bleeding, but feels comfortable remaining on Xarelto, given her personal history of PE x2 as well as family history of blood clots - She is tolerating Xarelto well without major bleeding events, although she does report that her gums have been bleeding easily for the past several months - No signs or symptoms  of recurrent DVT/PE   - PLAN: Continue Xarelto indefinitely.  We will continue to reassess periodically the ongoing risks and benefits of this  treatment plan.   2.   Iron deficiency state, without anemia - Patient had history of gastric bypass in 2011, has had issues with iron level since that time and has required previous IV iron at another facility - Iron levels failed to improve with oral supplementation.  Suspect malabsorption secondary to gastric bypass surgery. - Most recent IV iron with Feraheme x2 on 04/06/2021 and 04/13/2021 - Most recent labs (07/23/2022): Hgb 14.3/MCV 90.2, ferritin 99, iron saturation 41%. - She has ongoing fatigue, but denies any signs or symptoms of major blood loss - PLAN: No indication for IV iron at this time. -  Will check labs with phone visit in 6 months.   3.  Other deficiencies - She has been taking monthly B12 injections since November 2023.  She failed to improve on oral repletion prior to that. - She is taking vitamin D 5,000 units daily  - Most recent labs (07/23/2022): Marginal vitamin B12 at 215 with elevated MMA 483.  Vitamin D normal at 32.39. - PLAN: We will INCREASE the frequency of her vitamin B12 injections to be given once every 3 weeks - Continue vitamin D supplements.  - We will recheck B12/MMA and vitamin D in 6 months  PLAN SUMMARY: >> Prescription sent to pharmacy for patient to self administer B12 injections every 3 weeks >> Labs in 6 months (CBC/D, ferritin, iron/TIBC, B12, MMA, vitamin D) >> OFFICE visit 1 week after labs     I discussed the assessment and treatment plan with the patient. The patient was provided an opportunity to ask questions and all were answered. The patient agreed with the plan and demonstrated an understanding of the instructions.   The patient was advised to call back or seek an in-person evaluation if the symptoms worsen or if the condition fails to improve as anticipated.  I provided 22 minutes of non-face-to-face time during this encounter.  Carnella Gu[place], PA-C 07/30/22 9:44 AM

## 2022-07-30 ENCOUNTER — Encounter (HOSPITAL_COMMUNITY): Payer: Self-pay | Admitting: Hematology

## 2022-07-30 ENCOUNTER — Inpatient Hospital Stay (HOSPITAL_BASED_OUTPATIENT_CLINIC_OR_DEPARTMENT_OTHER): Payer: 59 | Admitting: Physician Assistant

## 2022-07-30 DIAGNOSIS — E611 Iron deficiency: Secondary | ICD-10-CM | POA: Diagnosis not present

## 2022-07-30 DIAGNOSIS — E538 Deficiency of other specified B group vitamins: Secondary | ICD-10-CM | POA: Diagnosis not present

## 2022-07-30 DIAGNOSIS — I2699 Other pulmonary embolism without acute cor pulmonale: Secondary | ICD-10-CM | POA: Diagnosis not present

## 2022-07-30 MED ORDER — NEEDLES & SYRINGES MISC
1 refills | Status: AC
Start: 2022-07-30 — End: ?

## 2022-07-30 MED ORDER — CYANOCOBALAMIN 1000 MCG/ML IJ SOLN
1000.0000 ug | INTRAMUSCULAR | 3 refills | Status: DC
Start: 2022-07-30 — End: 2022-09-07

## 2022-08-03 ENCOUNTER — Other Ambulatory Visit: Payer: Self-pay

## 2022-08-03 DIAGNOSIS — E611 Iron deficiency: Secondary | ICD-10-CM

## 2022-08-03 DIAGNOSIS — D509 Iron deficiency anemia, unspecified: Secondary | ICD-10-CM

## 2022-08-03 DIAGNOSIS — I2699 Other pulmonary embolism without acute cor pulmonale: Secondary | ICD-10-CM

## 2022-08-03 DIAGNOSIS — E538 Deficiency of other specified B group vitamins: Secondary | ICD-10-CM

## 2022-08-05 ENCOUNTER — Other Ambulatory Visit: Payer: Self-pay | Admitting: Family Medicine

## 2022-08-05 DIAGNOSIS — G43109 Migraine with aura, not intractable, without status migrainosus: Secondary | ICD-10-CM

## 2022-08-09 DIAGNOSIS — J209 Acute bronchitis, unspecified: Secondary | ICD-10-CM | POA: Diagnosis not present

## 2022-08-13 ENCOUNTER — Telehealth: Payer: 59

## 2022-08-16 ENCOUNTER — Encounter: Payer: Self-pay | Admitting: Family

## 2022-08-16 ENCOUNTER — Ambulatory Visit (INDEPENDENT_AMBULATORY_CARE_PROVIDER_SITE_OTHER): Payer: 59 | Admitting: Family

## 2022-08-16 ENCOUNTER — Ambulatory Visit (INDEPENDENT_AMBULATORY_CARE_PROVIDER_SITE_OTHER): Payer: 59

## 2022-08-16 VITALS — BP 129/67 | HR 75 | Temp 97.8°F

## 2022-08-16 DIAGNOSIS — F17290 Nicotine dependence, other tobacco product, uncomplicated: Secondary | ICD-10-CM

## 2022-08-16 DIAGNOSIS — J4541 Moderate persistent asthma with (acute) exacerbation: Secondary | ICD-10-CM | POA: Diagnosis not present

## 2022-08-16 DIAGNOSIS — J45909 Unspecified asthma, uncomplicated: Secondary | ICD-10-CM | POA: Diagnosis not present

## 2022-08-16 MED ORDER — PREDNISONE 10 MG PO TABS
ORAL_TABLET | ORAL | 0 refills | Status: DC
Start: 2022-08-16 — End: 2022-09-02

## 2022-08-16 MED ORDER — TRELEGY ELLIPTA 100-62.5-25 MCG/ACT IN AEPB
1.0000 | INHALATION_SPRAY | Freq: Every day | RESPIRATORY_TRACT | 11 refills | Status: DC
Start: 2022-08-16 — End: 2022-09-02

## 2022-08-16 NOTE — Progress Notes (Signed)
Subjective:    Patient ID: Stephanie Dyer, female    DOB: 07-Feb-1976, 47 y.o.   MRN: [id number]  Chief Complaint  Patient presents with   Cough   Shortness of Breath    Been over a week. Went to urgent care    PT presents to the office today with SOB for one week. She went to the Urgent Care on 08/09/22 and was given Levaquin, medrol dose pack, albuterol , cough medication. She was also given depo- medrol injection and nebulizer treatement.  Cough Associated symptoms include nasal congestion, rhinorrhea, shortness of breath and wheezing. Pertinent negatives include no ear congestion, ear pain, fever, headaches or sore throat. Her past medical history is significant for asthma.  Shortness of Breath Associated symptoms include rhinorrhea and wheezing. Pertinent negatives include no ear pain, fever, headaches or sore throat. Her past medical history is significant for asthma.  Asthma She complains of chest tightness, cough, shortness of breath and wheezing. This is a new problem. The cough is productive of purulent sputum. Associated symptoms include dyspnea on exertion, nasal congestion and rhinorrhea. Pertinent negatives include no ear congestion, ear pain, fever, headaches or sore throat. Her symptoms are aggravated by exposure to smoke. Her symptoms are alleviated by rest and OTC inhaler. Her symptoms are not alleviated by OTC cough suppressant, rest, OTC inhaler, prescription cough suppressant and steroid inhaler. Risk factors for lung disease include smoking/tobacco exposure. Her past medical history is significant for asthma.      Review of Systems  Constitutional:  Negative for fever.  HENT:  Positive for rhinorrhea. Negative for ear pain and sore throat.   Respiratory:  Positive for cough, shortness of breath and wheezing.   Cardiovascular:  Positive for dyspnea on exertion.  Neurological:  Negative for headaches.  All other systems reviewed and are negative.       Objective:   Physical Exam Vitals reviewed.  Constitutional:      General: She is not in acute distress.    Appearance: She is well-developed.  HENT:     Head: Normocephalic and atraumatic.     Right Ear: External ear normal.  Eyes:     Pupils: Pupils are equal, round, and reactive to light.  Neck:     Thyroid: No thyromegaly.  Cardiovascular:     Rate and Rhythm: Normal rate and regular rhythm.     Heart sounds: Normal heart sounds. No murmur heard. Pulmonary:     Effort: Pulmonary effort is normal. No respiratory distress.     Breath sounds: Rhonchi present. No wheezing.  Abdominal:     General: Bowel sounds are normal. There is no distension.     Palpations: Abdomen is soft.     Tenderness: There is no abdominal tenderness.  Musculoskeletal:        General: No tenderness. Normal range of motion.     Cervical back: Normal range of motion and neck supple.  Skin:    General: Skin is warm and dry.  Neurological:     Mental Status: She is alert and oriented to person, place, and time.     Cranial Nerves: No cranial nerve deficit.     Deep Tendon Reflexes: Reflexes are normal and symmetric.  Psychiatric:        Behavior: Behavior normal.        Thought Content: Thought content normal.        Judgment: Judgment normal.       BP 129/67   Pulse 75  Temp 97.8 F (36.6 C) (Temporal)   LMP 11/04/2018   SpO2 94%      Assessment & Plan:  Stephanie Dyer comes in today with chief complaint of Cough and Shortness of Breath (Been over a week. Went to urgent care )   Diagnosis and orders addressed:  1. Moderate persistent asthma with exacerbation Start Trelegy  Start long dose prednisone  X-ray pending  Avoid smoking  Follow up if symptoms worsen or do not improve  - DG Chest 2 View - predniSONE (DELTASONE) 10 MG tablet; Days 1-4 take 4 tablets (40 mg) daily  Days 5-8 take 3 tablets (30 mg) daily, Days 9-11 take 2 tablets (20 mg) daily, Days 12-14 take 1  tablet (10 mg) daily.  Dispense: 37 tablet; Refill: 0 - Fluticasone-Umeclidin-Vilant (TRELEGY ELLIPTA) 100-62.5-25 MCG/ACT AEPB; Inhale 1 puff into the lungs daily.  Dispense: 1 each; Refill: 11   2. Vaping nicotine dependence, tobacco product    Jannifer Rodney, FNP

## 2022-08-16 NOTE — Patient Instructions (Signed)
Asthma, Adult  Asthma is a long-term (chronic) condition that causes recurrent episodes in which the lower airways in the lungs become tight and narrow. The narrowing is caused by inflammation and tightening of the smooth muscle around the lower airways. Asthma episodes, also called asthma attacks or asthma flares, may cause coughing, making high-pitched whistling sounds when you breathe, most often when you breathe out (wheezing), shortness of breath, and chest pain. The airways may produce extra mucus caused by the inflammation and irritation. During an attack, it can be difficult to breathe. Asthma attacks can range from minor to life-threatening. Asthma cannot be cured, but medicines and lifestyle changes can help control it and treat acute attacks. It is important to keep your asthma well controlled so the condition does not interfere with your daily life. What are the causes? This condition is believed to be caused by inherited (genetic) and environmental factors, but its exact cause is not known. What can trigger an asthma attack? Many things can bring on an asthma attack or make symptoms worse. These triggers are different for every person. Common triggers include: Allergens and irritants like mold, dust, pet dander, cockroaches, pollen, air pollution, and chemical odors. Cigarette smoke. Weather changes and cold air. Stress and strong emotional responses such as crying or laughing hard. Certain medications such as aspirin or beta blockers. Infections and inflammatory conditions, such as the flu, a cold, pneumonia, or inflammation of the nasal membranes (rhinitis). Gastroesophageal reflux disease (GERD). What are the signs or symptoms? Symptoms may occur right after exposure to an asthma trigger or hours later and can vary by person. Common signs and symptoms include: Wheezing. Trouble breathing (shortness of breath). Excessive nighttime or early morning coughing. Chest  tightness. Tiredness (fatigue) with minimal activity. Difficulty talking in complete sentences. Poor exercise tolerance. How is this diagnosed? This condition is diagnosed based on: A physical exam and your medical history. Tests, which may include: Lung function studies to evaluate the flow of air in your lungs. Allergy tests. Imaging tests, such as X-rays. How is this treated? There is no cure, but symptoms can be controlled with proper treatment. Treatment usually involves: Identifying and avoiding your asthma triggers. Inhaled medicines. Two types are commonly used to treat asthma, depending on severity: Controller medicines. These help prevent asthma symptoms from occurring. They are taken every day. Fast-acting reliever or rescue medicines. These quickly relieve asthma symptoms. They are used as needed and provide short-term relief. Using other medicines, such as: Allergy medicines, such as antihistamines, if your asthma attacks are triggered by allergens. Immune medicines (immunomodulators). These are medicines that help control the immune system. Using supplemental oxygen. This is only needed during a severe episode. Creating an asthma action plan. An asthma action plan is a written plan for managing and treating your asthma attacks. This plan includes: A list of your asthma triggers and how to avoid them. Information about when medicines should be taken and when their dosage should be changed. Instructions about using a device called a peak flow meter. A peak flow meter measures how well the lungs are working and the severity of your asthma. It helps you monitor your condition. Follow these instructions at home: Take over-the-counter and prescription medicines only as told by your health care provider. Stay up to date on all vaccinations as recommended by your healthcare provider, including vaccines for the flu and pneumonia. Use a peak flow meter and keep track of your peak flow  readings. Understand and use your asthma   action plan to address any asthma flares. Do not smoke or allow anyone to smoke in your home. Contact a health care provider if: You have wheezing, shortness of breath, or a cough that is not responding to medicines. Your medicines are causing side effects, such as a rash, itching, swelling, or trouble breathing. You need to use a reliever medicine more than 2-3 times a week. Your peak flow reading is still at 50-79% of your personal best after following your action plan for 1 hour. You have a fever and shortness of breath. Get help right away if: You are getting worse and do not respond to treatment during an asthma attack. You are short of breath when at rest or when doing very little physical activity. You have difficulty eating, drinking, or talking. You have chest pain or tightness. You develop a fast heartbeat or palpitations. You have a bluish color to your lips or fingernails. You are light-headed or dizzy, or you faint. Your peak flow reading is less than 50% of your personal best. You feel too tired to breathe normally. These symptoms may be an emergency. Get help right away. Call 911. Do not wait to see if the symptoms will go away. Do not drive yourself to the hospital. Summary Asthma is a long-term (chronic) condition that causes recurrent episodes in which the airways become tight and narrow. Asthma episodes, also called asthma attacks or asthma flares, can cause coughing, wheezing, shortness of breath, and chest pain. Asthma cannot be cured, but medicines and lifestyle changes can help keep it well controlled and prevent asthma flares. Make sure you understand how to avoid triggers and how and when to use your medicines. Asthma attacks can range from minor to life-threatening. Get help right away if you have an asthma attack and do not respond to treatment with your usual rescue medicines. This information is not intended to replace  advice given to you by your health care provider. Make sure you discuss any questions you have with your health care provider. Document Revised: 12/10/2020 Document Reviewed: 12/01/2020 Elsevier Patient Education  2024 [employer]  

## 2022-08-23 ENCOUNTER — Ambulatory Visit: Payer: 59 | Admitting: Internal Medicine

## 2022-08-31 ENCOUNTER — Telehealth: Payer: Self-pay | Admitting: *Deleted

## 2022-08-31 NOTE — Telephone Encounter (Signed)
Patient called to make [place] aware that she had a physical for work, as a IT sales professional. 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  Discussed with Rojelio Brenner, [facility] for recommendations and she  continues to recommend indefinite anticoagulation. Patient has had 2 separate episodes of blood clots, and the second episode was unprovoked. She also has a family history of blood clots. To stop taking Xarelto would put her at high risk of recurrent blood clots in the future and is medically necessary.  Discussed with patient and she verbalized understanding of rationale.

## 2022-09-01 ENCOUNTER — Telehealth: Payer: Self-pay | Admitting: Family Medicine

## 2022-09-01 NOTE — Telephone Encounter (Signed)
Just FYI: Patient scheduled herself for CPE on MyChart for 6/27 in a 15 min slot, when patient called in she was made aware that she would need to reschedule for a different time if she wanted a CPE because those require more time. 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 Stated that she needed more tests that we are unable to do here. She said that she did not have to have it within a certain time but still did not want to reschedule for a CPE later. Wanted to make sure that the PCP and nurse were aware but was unsure of who the nurse would be on 6/27 when the patient will be coming in for the appt.

## 2022-09-01 NOTE — Telephone Encounter (Signed)
Please review. FYI 

## 2022-09-01 NOTE — Telephone Encounter (Signed)
Made appt 30 mins

## 2022-09-02 ENCOUNTER — Ambulatory Visit (INDEPENDENT_AMBULATORY_CARE_PROVIDER_SITE_OTHER): Payer: 59 | Admitting: Family Medicine

## 2022-09-02 ENCOUNTER — Encounter: Payer: Self-pay | Admitting: Family Medicine

## 2022-09-02 VITALS — BP 100/67 | HR 68 | Temp 97.8°F | Resp 20 | Ht 64.0 in | Wt 174.0 lb

## 2022-09-02 DIAGNOSIS — Z130 Encounter for screening for diseases of the blood and blood-forming organs and certain disorders involving the immune mechanism: Secondary | ICD-10-CM

## 2022-09-02 DIAGNOSIS — J4520 Mild intermittent asthma, uncomplicated: Secondary | ICD-10-CM | POA: Diagnosis not present

## 2022-09-02 DIAGNOSIS — I2699 Other pulmonary embolism without acute cor pulmonale: Secondary | ICD-10-CM | POA: Diagnosis not present

## 2022-09-02 DIAGNOSIS — Z0001 Encounter for general adult medical examination with abnormal findings: Secondary | ICD-10-CM

## 2022-09-02 DIAGNOSIS — Z0000 Encounter for general adult medical examination without abnormal findings: Secondary | ICD-10-CM

## 2022-09-02 DIAGNOSIS — M25511 Pain in right shoulder: Secondary | ICD-10-CM

## 2022-09-02 DIAGNOSIS — Z13220 Encounter for screening for lipoid disorders: Secondary | ICD-10-CM

## 2022-09-02 DIAGNOSIS — G8929 Other chronic pain: Secondary | ICD-10-CM | POA: Diagnosis not present

## 2022-09-02 NOTE — Progress Notes (Signed)
Complete physical exam  Patient: Stephanie Dyer   DOB: [birth year]   47 y.o. Female  MRN: [id number]  Subjective:    Chief Complaint  Patient presents with   Annual Exam    Stephanie Dyer is a 47 y.o. female who presents today for a complete physical exam. She reports consuming a general diet. Home exercise routine includes running 2-3 miles a day. She generally feels well. She reports sleeping fairly well. She does have additional problems to discuss today.   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 She unfortunately has failed the physical until she has more testing and documentation from her providers.   She needs to get documentation from her orthopedic that she does not have any limitations due to her chronic right shoulder pain. She reports that she has gotten a clearance letter from them that she is able to return to work without restrictions.   She also needs documentation from hematology regarding any job limitations that she may have as a results on chronic anticoagulation from [facility] that she is on indefinitely due to recurrent PEs that were unprovoked.   And she also needs documentation from her asthma specialists regarding her asthma symptoms and repeat PFTs. She has mild intermittent asthma and reports that she only experiences symptoms when she is sick. Unfortunately she was recently sick and was treated with prednisone for an asthma exacerbation. She reports that she is able to perform her jobs duties, daily activities, and exercise without the need for a bronchodilator.    Most recent fall risk assessment:    07/23/2022    2:08 PM  Fall Risk   Falls in the past year? 0  Number falls in past yr: 0  Injury with Fall? 0  Risk for fall due to : No Fall Risks  Follow up Falls prevention discussed     Most recent depression screenings:    08/16/2022    8:05 AM 05/12/2022    1:01 PM   PHQ 2/9 Scores  PHQ - 2 Score 0 0  PHQ- 9 Score  8    Vision:Within last year and Dental: No current dental problems and Receives regular dental care  Past Medical History:  Diagnosis Date   Angio-edema    Ankylosing spondylitis (HCC)    Anxiety    Arthritis    Asthma    Cervical cancer (HCC) 1998   Elevated LFTs    Environmental allergies    GERD (gastroesophageal reflux disease)    History of gastric bypass    History of iron deficiency anemia    Hypoglycemia    Kidney stones    Lumbar back pain    Migraines    PCOS (polycystic ovarian syndrome)    Pseudotumor cerebri 2006   Thyroid cyst    Urticaria    Vitamin B12 deficiency 03/31/2020   Vitamin D deficiency       Patient Care Team: Gabriel Earing, FNP as PCP - General (Family Medicine) Lanelle Bal, DO as Consulting Physician (Internal Medicine)   Outpatient Medications Prior to Visit  Medication Sig   albuterol (2.5 MG/3ML) 0.083% NEBU 3 mL, albuterol (5 MG/ML) 0.5% NEBU 0.5 mL Inhale into the lungs.   B-D 3CC LUER-LOK SYR 25GX1" 25G X 1" 3 ML MISC Use monthly for B12 injections   Cholecalciferol (VITAMIN D-3) 125 MCG (5000 UT) TABS Take 1 tablet by mouth 2 (two) times daily.  cyanocobalamin (VITAMIN B12) 1000 MCG/ML injection Inject 1 mL (1,000 mcg total) into the muscle every 21 ( twenty-one) days.   EPINEPHrine 0.3 mg/0.3 mL IJ SOAJ injection Inject 0.3 mg into the muscle as needed.   fexofenadine (ALLEGRA) 180 MG tablet Take 180 mg by mouth daily.   FLUoxetine (PROZAC) 20 MG capsule Take 1 capsule by mouth once daily   fluticasone (FLONASE) 50 MCG/ACT nasal spray Place 2 sprays into both nostrils daily.   montelukast (SINGULAIR) 10 MG tablet Take 1 tablet (10 mg total) by mouth at bedtime.   Needles & Syringes MISC Injection supplies for every 3-week vitamin B12 injections x 6 months with 1 refill.   pantoprazole (PROTONIX) 40 MG tablet Take 1 tablet (40 mg total) by mouth daily. (NEEDS TO BE SEEN  BEFORE NEXT REFILL)   rivaroxaban (XARELTO) 20 MG TABS tablet TAKE 1 TABLET BY MOUTH ONCE DAILY WITH SUPPER   topiramate (TOPAMAX) 50 MG tablet Take 1.5 tablets (75 mg total) by mouth daily. (NEEDS TO BE SEEN BEFORE NEXT REFILL)   [DISCONTINUED] Continuous Blood Gluc Sensor (FREESTYLE LIBRE 3 SENSOR) MISC Place 1 sensor on the skin every 14 days. Use to check glucose continuously   [DISCONTINUED] Fluticasone-Umeclidin-Vilant (TRELEGY ELLIPTA) 100-62.5-25 MCG/ACT AEPB Inhale 1 puff into the lungs daily.   [DISCONTINUED] levocetirizine (XYZAL) 5 MG tablet Take 1 tablet (5 mg total) by mouth every evening.   [DISCONTINUED] predniSONE (DELTASONE) 10 MG tablet Days 1-4 take 4 tablets (40 mg) daily  Days 5-8 take 3 tablets (30 mg) daily, Days 9-11 take 2 tablets (20 mg) daily, Days 12-14 take 1 tablet (10 mg) daily.   albuterol (VENTOLIN HFA) 108 (90 Base) MCG/ACT inhaler Inhale 2 puffs into the lungs every 6 (six) hours as needed. (Patient not taking: Reported on 09/02/2022)   No facility-administered medications prior to visit.    ROS        Objective:     BP 100/67   Pulse 68   Temp 97.8 F (36.6 C) (Temporal)   Resp 20   Ht 5\' 4"  (1.626 m)   Wt 174 lb (78.9 kg)   LMP 11/04/2018   SpO2 97%   BMI 29.87 kg/m    Physical Exam Vitals and nursing note reviewed.  Constitutional:      General: She is not in acute distress.    Appearance: Normal appearance. She is not ill-appearing.  HENT:     Head: Normocephalic and atraumatic.     Right Ear: Tympanic membrane, ear canal and external ear normal.     Left Ear: Tympanic membrane, ear canal and external ear normal.     Nose: Nose normal.     Mouth/Throat:     Mouth: Mucous membranes are moist.     Pharynx: Oropharynx is clear.  Eyes:     Extraocular Movements: Extraocular movements intact.     Conjunctiva/sclera: Conjunctivae normal.     Pupils: Pupils are equal, round, and reactive to light.  Neck:     Thyroid: No thyroid mass,  thyromegaly or thyroid tenderness.  Cardiovascular:     Rate and Rhythm: Normal rate and regular rhythm.     Pulses: Normal pulses.     Heart sounds: Normal heart sounds. No murmur heard.    No friction rub. No gallop.  Pulmonary:     Effort: Pulmonary effort is normal.     Breath sounds: Normal breath sounds.  Abdominal:     General: Bowel sounds are normal. There is no  distension.     Palpations: Abdomen is soft. There is no mass.     Tenderness: There is no abdominal tenderness. There is no guarding.  Musculoskeletal:        General: No swelling or tenderness. Normal range of motion.     Cervical back: Normal range of motion and neck supple. No tenderness.     Right lower leg: No edema.     Left lower leg: No edema.  Lymphadenopathy:     Cervical: No cervical adenopathy.  Skin:    General: Skin is warm and dry.     Capillary Refill: Capillary refill takes less than 2 seconds.     Findings: No lesion or rash.  Neurological:     General: No focal deficit present.     Mental Status: She is alert and oriented to person, place, and time.     Cranial Nerves: No cranial nerve deficit.     Sensory: No sensory deficit.     Motor: No weakness.     Coordination: Coordination normal.     Gait: Gait normal.     Deep Tendon Reflexes: Reflexes normal.  Psychiatric:        Mood and Affect: Mood normal.        Behavior: Behavior normal.        Thought Content: Thought content normal.        Judgment: Judgment normal.      No results found for any visits on 09/02/22.     Assessment & Plan:    Routine Health Maintenance and Physical Exam Justene was seen today for annual exam.  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 Managed by hematology.   Mild intermittent asthma without complication She will follow up with her asthma specialist. Currently well  controlled, though she did recently have an exacerbation due to illness.   Chronic right shoulder pain Resolved with injection. She will follow up with ortho regarding clearance.   Encounter for screening for lipid disorder She will return for fasting labs.  -     Lipid panel; Future  Screening for endocrine, metabolic and immunity disorder She will return for fasting labs.  -     CBC with Differential/Platelet; Future -     CMP14+EGFR; Future -     TSH; Future   Immunization History  Administered Date(s) Administered   Influenza-Unspecified 01/05/2021   Tdap 03/29/2015    Health Maintenance  Topic Date Due   Colonoscopy  Never done   COVID-19 Vaccine (1) 09/18/2022 (Originally 01/23/1976)   INFLUENZA VACCINE  10/07/2022   MAMMOGRAM  03/04/2023   Medicare Annual Wellness (AWV)  07/23/2023   DTaP/Tdap/Td (2 - Td or Tdap) 03/28/2025   Hepatitis C Screening  Completed   HIV Screening  Completed   HPV VACCINES  Aged Out    Discussed health benefits of physical activity, and encouraged her to engage in regular exercise appropriate for her age and condition.  Problem List Items Addressed This Visit       Cardiovascular and Mediastinum   Recurrent pulmonary embolism (HCC)     Respiratory   Asthma   Other Visit Diagnoses     Routine general medical examination at a health care facility    -  Primary   Encounter for screening for lipid disorder       Relevant Orders   Lipid panel  Screening for endocrine, metabolic and immunity disorder       Relevant Orders   CBC with Differential/Platelet   CMP14+EGFR   TSH   Chronic right shoulder pain          Return in about 1 year (around 09/02/2023).   The patient indicates understanding of these issues and agrees with the plan.  Gabriel Earing, FNP

## 2022-09-02 NOTE — Patient Instructions (Signed)

## 2022-09-07 ENCOUNTER — Other Ambulatory Visit: Payer: Self-pay | Admitting: *Deleted

## 2022-09-07 DIAGNOSIS — E538 Deficiency of other specified B group vitamins: Secondary | ICD-10-CM

## 2022-09-07 MED ORDER — "BD LUER-LOK SYRINGE 25G X 1"" 3 ML MISC": 6 refills | Status: DC

## 2022-09-07 MED ORDER — RIVAROXABAN 20 MG PO TABS: 20.0000 mg | ORAL_TABLET | Freq: Every day | ORAL | 3 refills | Status: DC

## 2022-09-07 MED ORDER — CYANOCOBALAMIN 1000 MCG/ML IJ SOLN
1000.0000 ug | INTRAMUSCULAR | 3 refills | Status: DC
Start: 2022-09-07 — End: 2023-01-31
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
## 2022-09-08 ENCOUNTER — Other Ambulatory Visit: Payer: 59

## 2022-09-08 ENCOUNTER — Inpatient Hospital Stay: Payer: 59 | Attending: Physician Assistant | Admitting: Physician Assistant

## 2022-09-08 ENCOUNTER — Ambulatory Visit: Payer: 59 | Admitting: Physician Assistant

## 2022-09-08 ENCOUNTER — Encounter: Payer: Self-pay | Admitting: Physician Assistant

## 2022-09-08 VITALS — BP 108/76 | HR 76 | Resp 18 | Wt 175.3 lb

## 2022-09-08 DIAGNOSIS — Z86718 Personal history of other venous thrombosis and embolism: Secondary | ICD-10-CM | POA: Insufficient documentation

## 2022-09-08 DIAGNOSIS — Z86711 Personal history of pulmonary embolism: Secondary | ICD-10-CM | POA: Insufficient documentation

## 2022-09-08 DIAGNOSIS — Z87891 Personal history of nicotine dependence: Secondary | ICD-10-CM | POA: Diagnosis not present

## 2022-09-08 DIAGNOSIS — Z13228 Encounter for screening for other metabolic disorders: Secondary | ICD-10-CM | POA: Diagnosis not present

## 2022-09-08 DIAGNOSIS — Z136 Encounter for screening for cardiovascular disorders: Secondary | ICD-10-CM | POA: Diagnosis not present

## 2022-09-08 DIAGNOSIS — Z1329 Encounter for screening for other suspected endocrine disorder: Secondary | ICD-10-CM | POA: Diagnosis not present

## 2022-09-08 DIAGNOSIS — E559 Vitamin D deficiency, unspecified: Secondary | ICD-10-CM | POA: Insufficient documentation

## 2022-09-08 DIAGNOSIS — E611 Iron deficiency: Secondary | ICD-10-CM | POA: Diagnosis not present

## 2022-09-08 DIAGNOSIS — Z13220 Encounter for screening for lipoid disorders: Secondary | ICD-10-CM | POA: Diagnosis not present

## 2022-09-08 DIAGNOSIS — K219 Gastro-esophageal reflux disease without esophagitis: Secondary | ICD-10-CM | POA: Diagnosis not present

## 2022-09-08 DIAGNOSIS — Z79899 Other long term (current) drug therapy: Secondary | ICD-10-CM | POA: Insufficient documentation

## 2022-09-08 DIAGNOSIS — E041 Nontoxic single thyroid nodule: Secondary | ICD-10-CM | POA: Diagnosis not present

## 2022-09-08 DIAGNOSIS — Z130 Encounter for screening for diseases of the blood and blood-forming organs and certain disorders involving the immune mechanism: Secondary | ICD-10-CM | POA: Diagnosis not present

## 2022-09-08 DIAGNOSIS — I2699 Other pulmonary embolism without acute cor pulmonale: Secondary | ICD-10-CM | POA: Diagnosis not present

## 2022-09-08 DIAGNOSIS — Z7901 Long term (current) use of anticoagulants: Secondary | ICD-10-CM | POA: Insufficient documentation

## 2022-09-08 DIAGNOSIS — E538 Deficiency of other specified B group vitamins: Secondary | ICD-10-CM | POA: Insufficient documentation

## 2022-09-08 LAB — URINALYSIS, ROUTINE W REFLEX MICROSCOPIC
Bilirubin, UA: NEGATIVE
Glucose, UA: NEGATIVE
Ketones, UA: NEGATIVE
Nitrite, UA: NEGATIVE
Protein,UA: NEGATIVE
RBC, UA: NEGATIVE
Specific Gravity, UA: 1.025 (ref 1.005–1.030)
Urobilinogen, Ur: 1 mg/dL (ref 0.2–1.0)
pH, UA: 6 (ref 5.0–7.5)

## 2022-09-08 LAB — MICROSCOPIC EXAMINATION
RBC, Urine: NONE SEEN /hpf (ref 0–2)
Renal Epithel, UA: NONE SEEN /hpf

## 2022-09-08 LAB — CBC WITH DIFFERENTIAL/PLATELET
Immature Granulocytes: 0 %
Lymphocytes Absolute: 2 10*3/uL (ref 0.7–3.1)
MCHC: 32.7 g/dL (ref 31.5–35.7)
Monocytes: 7 %
Neutrophils Absolute: 2.7 10*3/uL (ref 1.4–7.0)
WBC: 5.3 10*3/uL (ref 3.4–10.8)

## 2022-09-08 LAB — CMP14+EGFR
Alkaline Phosphatase: 110 IU/L (ref 44–121)
BUN/Creatinine Ratio: 17 (ref 9–23)
CO2: 22 mmol/L (ref 20–29)
Chloride: 109 mmol/L — ABNORMAL HIGH (ref 96–106)
Creatinine, Ser: 0.66 mg/dL (ref 0.57–1.00)
Globulin, Total: 2.4 g/dL (ref 1.5–4.5)
Potassium: 4 mmol/L (ref 3.5–5.2)
Sodium: 145 mmol/L — ABNORMAL HIGH (ref 134–144)

## 2022-09-08 LAB — LIPID PANEL
Chol/HDL Ratio: 2.2 ratio (ref 0.0–4.4)
HDL: 65 mg/dL (ref >39)
LDL Chol Calc (NIH): 69 mg/dL (ref 0–99)

## 2022-09-08 NOTE — Patient Instructions (Signed)
[facility] at [facility] **VISIT SUMMARY & IMPORTANT INSTRUCTIONS **   You were seen today by Rojelio Brenner PA-C to discuss your history of blood clots and recommendations regarding blood thinners.    You are considered moderate to high risk for recurrent blood clots due to the following factors: You have had 2 separate episodes of blood clots. Your second blood clot (June 2022) was apparently "unprovoked."  This means that there was no identifiable cause of your blood clot, which places you at higher risk of blood clots coming back in the future. You have strong family history of blood clots.  Therefore, it is recommended that you continue indefinite anticoagulation with Xarelto or similar blood thinner. We cannot list specific job duties that you are or are not able to perform, but overall would advise you to avoid activities that could cause laceration, bleeding, or head trauma. The specific job duties that you are or are not able to perform must be determined by your employer [employer]) and their occupational health provider.  FOLLOW-UP APPOINTMENT: You are scheduled for labs (01/24/2023) and office visit (01/31/2023) in November.  ** Thank you for trusting me with your healthcare!  I strive to provide all of my patients with quality care at each visit.  If you receive a survey for this visit, I would be so grateful to you for taking the time to provide feedback.  Thank you in advance!  ~ Sonja Manseau                   Dr. Doreatha Massed   &   Rojelio Brenner, PA-C   - - - - - - - - - - - - - - - - - -    Thank you for choosing [facility] at [facility] to provide your oncology and hematology care.  To afford each patient quality time with our provider, please arrive at least 15 minutes before your scheduled appointment time.   If you have a lab appointment with the Cancer Center please come in thru the Main Entrance and check  in at the main information desk.  You need to re-schedule your appointment should you arrive 10 or more minutes late.  We strive to give you quality time with our providers, and arriving late affects you and other patients whose appointments are after yours.  Also, if you no show three or more times for appointments you may be dismissed from the clinic at the providers discretion.     Again, thank you for choosing [facility]  Our hope is that these requests will decrease the amount of time that you wait before being seen by our physicians.       _____________________________________________________________  Should you have questions after your visit to [facility], please contact our office at [phone] and follow the prompts.  Our office hours are 8:00 a.m. and 4:30 p.m. Monday - Friday.  Please note that voicemails left after 4:00 p.m. may not be returned until the following business day.  We are closed weekends and major holidays.  You do have access to a nurse 24-7, just call the main number to the clinic [phone] and do not press any options, hold on the line and a nurse will answer the phone.    For prescription refill requests, have your pharmacy contact our office and allow 72 hours.

## 2022-09-08 NOTE — Progress Notes (Signed)
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  (Letter has been scanned into patient chart, available under "Media" tab.)

## 2022-09-09 LAB — LIPID PANEL
Cholesterol, Total: 146 mg/dL (ref 100–199)
Triglycerides: 58 mg/dL (ref 0–149)
VLDL Cholesterol Cal: 12 mg/dL (ref 5–40)

## 2022-09-09 LAB — CMP14+EGFR
ALT: 20 IU/L (ref 0–32)
AST: 17 IU/L (ref 0–40)
Albumin: 4 g/dL (ref 3.9–4.9)
BUN: 11 mg/dL (ref 6–24)
Bilirubin Total: 0.6 mg/dL (ref 0.0–1.2)
Calcium: 9.1 mg/dL (ref 8.7–10.2)
Glucose: 86 mg/dL (ref 70–99)
Total Protein: 6.4 g/dL (ref 6.0–8.5)
eGFR: 110 mL/min/{1.73_m2} (ref >59)

## 2022-09-09 LAB — CBC WITH DIFFERENTIAL/PLATELET
Basophils Absolute: 0 10*3/uL (ref 0.0–0.2)
Basos: 1 %
EOS (ABSOLUTE): 0.1 10*3/uL (ref 0.0–0.4)
Eos: 2 %
Hematocrit: 44.9 % (ref 34.0–46.6)
Hemoglobin: 14.7 g/dL (ref 11.1–15.9)
Immature Grans (Abs): 0 10*3/uL (ref 0.0–0.1)
Lymphs: 38 %
MCH: 29.5 pg (ref 26.6–33.0)
MCV: 90 fL (ref 79–97)
Monocytes Absolute: 0.4 10*3/uL (ref 0.1–0.9)
Neutrophils: 52 %
Platelets: 204 10*3/uL (ref 150–450)
RBC: 4.98 x10E6/uL (ref 3.77–5.28)
RDW: 13.4 % (ref 11.7–15.4)

## 2022-09-09 LAB — TSH: TSH: 1.98 u[IU]/mL (ref 0.450–4.500)

## 2022-09-10 ENCOUNTER — Telehealth: Payer: Self-pay | Admitting: Family Medicine

## 2022-09-10 ENCOUNTER — Other Ambulatory Visit: Payer: Self-pay

## 2022-09-10 ENCOUNTER — Other Ambulatory Visit: Payer: Self-pay | Admitting: Family Medicine

## 2022-09-10 DIAGNOSIS — Z9109 Other allergy status, other than to drugs and biological substances: Secondary | ICD-10-CM

## 2022-09-10 DIAGNOSIS — J301 Allergic rhinitis due to pollen: Secondary | ICD-10-CM

## 2022-09-10 DIAGNOSIS — R8290 Unspecified abnormal findings in urine: Secondary | ICD-10-CM

## 2022-09-10 DIAGNOSIS — F411 Generalized anxiety disorder: Secondary | ICD-10-CM

## 2022-09-10 DIAGNOSIS — F603 Borderline personality disorder: Secondary | ICD-10-CM

## 2022-09-10 MED ORDER — PANTOPRAZOLE SODIUM 40 MG PO TBEC: 40.0000 mg | DELAYED_RELEASE_TABLET | Freq: Every day | ORAL | 0 refills | Status: DC

## 2022-09-10 MED ORDER — FLUOXETINE HCL 20 MG PO CAPS
20.0000 mg | ORAL_CAPSULE | Freq: Every day | ORAL | 3 refills | Status: DC
Start: 2022-09-10 — End: 2023-08-11

## 2022-09-10 MED ORDER — EPINEPHRINE 0.3 MG/0.3ML IJ SOAJ
0.3000 mg | INTRAMUSCULAR | 2 refills | Status: DC | PRN
Start: 2022-09-10 — End: 2022-11-17

## 2022-09-10 MED ORDER — CEPHALEXIN 500 MG PO CAPS
500.0000 mg | ORAL_CAPSULE | Freq: Two times a day (BID) | ORAL | 0 refills | Status: DC
Start: 2022-09-10 — End: 2022-10-06

## 2022-09-10 MED ORDER — FLUCONAZOLE 150 MG PO TABS: 150.0000 mg | ORAL_TABLET | Freq: Once | ORAL | 0 refills | Status: AC

## 2022-09-10 MED ORDER — MONTELUKAST SODIUM 10 MG PO TABS
10.0000 mg | ORAL_TABLET | Freq: Every day | ORAL | 3 refills | Status: DC
Start: 2022-09-10 — End: 2023-03-21

## 2022-09-10 NOTE — Telephone Encounter (Signed)
Needs all of patients Rx's transferred to [facility]. Pt no longer uses Psychologist, forensic.

## 2022-09-10 NOTE — Telephone Encounter (Signed)
Verified with pt - all meds has been transferred - pt also needed difulcan sent in due to keflex

## 2022-09-24 ENCOUNTER — Other Ambulatory Visit: Payer: Self-pay

## 2022-09-24 DIAGNOSIS — J4521 Mild intermittent asthma with (acute) exacerbation: Secondary | ICD-10-CM

## 2022-09-24 DIAGNOSIS — H938X3 Other specified disorders of ear, bilateral: Secondary | ICD-10-CM

## 2022-09-24 DIAGNOSIS — Z9109 Other allergy status, other than to drugs and biological substances: Secondary | ICD-10-CM

## 2022-09-24 DIAGNOSIS — G43109 Migraine with aura, not intractable, without status migrainosus: Secondary | ICD-10-CM

## 2022-09-24 MED ORDER — TOPIRAMATE 50 MG PO TABS
75.0000 mg | ORAL_TABLET | Freq: Every day | ORAL | 1 refills | Status: DC
Start: 2022-09-24 — End: 2023-03-17

## 2022-09-24 MED ORDER — FLUTICASONE PROPIONATE 50 MCG/ACT NA SUSP
2.0000 | Freq: Every day | NASAL | 5 refills | Status: DC
Start: 2022-09-24 — End: 2023-03-17

## 2022-09-24 MED ORDER — ALBUTEROL SULFATE HFA 108 (90 BASE) MCG/ACT IN AERS: 2.0000 | INHALATION_SPRAY | Freq: Four times a day (QID) | RESPIRATORY_TRACT | 4 refills | Status: DC | PRN

## 2022-09-24 NOTE — Telephone Encounter (Signed)
Pt is requesting refills of   Albuterol inhaler 90ug  Fluticasone 50ug  Topiramate 50mg   Would like refills sent to [facility]  Pts last OV 09/02/22  Next OV not scheduled  Tiffany, you have never prescribed the Albuterol or Fluticasone. Please review if alright to send.

## 2022-09-27 ENCOUNTER — Other Ambulatory Visit: Payer: Self-pay

## 2022-09-27 ENCOUNTER — Other Ambulatory Visit: Payer: Self-pay | Admitting: Family Medicine

## 2022-09-27 MED ORDER — "BD LUER-LOK SYRINGE 25G X 1"" 3 ML MISC": 0 refills | Status: DC

## 2022-10-06 ENCOUNTER — Other Ambulatory Visit: Payer: Self-pay

## 2022-10-06 ENCOUNTER — Ambulatory Visit (INDEPENDENT_AMBULATORY_CARE_PROVIDER_SITE_OTHER): Payer: 59 | Admitting: Allergy & Immunology

## 2022-10-06 ENCOUNTER — Encounter: Payer: Self-pay | Admitting: Allergy & Immunology

## 2022-10-06 VITALS — BP 122/70 | HR 88 | Temp 98.2°F | Resp 18 | Ht 63.0 in | Wt 173.1 lb

## 2022-10-06 DIAGNOSIS — T7800XD Anaphylactic reaction due to unspecified food, subsequent encounter: Secondary | ICD-10-CM

## 2022-10-06 DIAGNOSIS — J4540 Moderate persistent asthma, uncomplicated: Secondary | ICD-10-CM | POA: Diagnosis not present

## 2022-10-06 DIAGNOSIS — J310 Chronic rhinitis: Secondary | ICD-10-CM | POA: Diagnosis not present

## 2022-10-06 DIAGNOSIS — K219 Gastro-esophageal reflux disease without esophagitis: Secondary | ICD-10-CM

## 2022-10-06 DIAGNOSIS — T63481D Toxic effect of venom of other arthropod, accidental (unintentional), subsequent encounter: Secondary | ICD-10-CM

## 2022-10-06 NOTE — Progress Notes (Addendum)
FOLLOW UP  Date of Service/Encounter:  10/06/22   Assessment:   Moderate persistent asthma, uncomplicated  Chronic rhinitis   Anaphylactic shock due to food  Insect sting allergy  Gastroesophageal reflux disease  Disabled status   Plan/Recommendations:   1. Moderate persistent asthma, uncomplicated - Lung testing was well over 100%. - Copy of testing results provided.  - Daily controller medication(s): Singulair 10mg  daily - Prior to physical activity: albuterol 2 puffs 10-15 minutes before physical activity. - Rescue medications: albuterol 4 puffs every 4-6 hours as needed - Asthma control goals:  * Full participation in all desired activities (may need albuterol before activity) * Albuterol use two time or less a week on average (not counting use with activity) * Cough interfering with sleep two time or less a month * Oral steroids no more than once a year * No hospitalizations  2. Chronic rhinitis - We are going to get some blood work to look for environmental allergies. - We can always consider allergy shots in the future. - Continue with the Allegra daily. - Continue with the Singulair daily. - Continue with the Flonase daily.  - We can consider allergy shots in the future.   3. Anaphylactic shock due to food - Continue to avoid peanuts, tree nuts. And your triggering fruits. - Repeat labs ordered. - EpiPen is up to date.  - We will call you in 1-2 weeks with the results of the testing.   4. Insect sting allergy - We are going to get some labs to look for evidence of stinging insect allergies. - EpiPen is up to date.   5. Gastroesophageal reflux disease - Continue with pantoprazole.   6. Return in about 6 months (around 04/08/2023). You can have the follow up appointment with Dr. Dellis Dyer or a Nurse Practicioner (our Nurse Practitioners are excellent and always have Physician oversight!).   Subjective:   Stephanie Dyer is a 47 y.o. female  presenting today for follow up of  Chief Complaint  Patient presents with   Allergy Testing   Letter for School/Work    Needs a letter stating that she does not need long term prednisone and that her asthma is under control     Stephanie Dyer has a history of the following: Patient Active Problem List   Diagnosis Date Noted   Recurrent pulmonary embolism (HCC) 09/02/2022   Primary insomnia 11/06/2021   History of pulmonary embolus (PE) 03/17/2021   Borderline personality disorder (HCC) 03/17/2021   Obesity (BMI 30.0-34.9) 03/17/2021   Iron deficiency 08/29/2020   Vitamin B12 deficiency 03/31/2020   History of systemic reaction to hymenoptera sting 01/22/2020   Marijuana use 12/09/2019   Generalized anxiety disorder 12/06/2019   Hot flashes 12/06/2019   Pain in female genitalia on intercourse 12/06/2019   Ankylosing spondylitis (HCC)    Thyroid nodule    Asthma    Environmental allergies    Migraines    Vitamin D deficiency    Lumbar back pain    PCOS (polycystic ovarian syndrome)    GERD (gastroesophageal reflux disease)     History obtained from: chart review and patient.  Stephanie Dyer is a 47 y.o. female presenting for a follow up visit.  She was last seen in November 2021 by Dr. Selena Dyer.  At that time, she recently moved from [place].  She was continued on an over-the-counter antihistamine as well as Flonase, Singulair, and nasal saline.  She has a history of asthma and was using  albuterol 2 puffs every 4-6 hours as needed.  She also has a history of pneumonia as well as pulmonary embolism and left lung collapse.  She had a history of throat closure after peanut exposure.  Testing was borderline positive to pistachio but negative to everything else tested.  She was encouraged to continue to avoid peanuts and tree nuts.  She also had a stinging insect allergy.  He did have labs ordered, but they were never collected.  Since last visit, she hs been OK.  Asthma/Respiratory  Symptom History: She had asthma as a child. It is only triggered when she is sick. 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  This June 3rd. She was not in the hospital and it was managed an an outpatient. She started feeling back over a weekend. She was placed on prednisone and got an antibiotic shot and got a course of oral antibiotics. She apparently was failed by some "Mobile Doc" and she has not been able to go back to work. The last time that she was as bad with her asthma was COVID in January 2024. She did not have prednisone with the COVID19. She just managed with the nebulizer and her albuterol MDI. She was on Advair years ago when she was in [place] (before [place]).   Allergic Rhinitis Symptom History: She has seasonal allergies in the fall and spring. She uses montelukast and Allegra. She does use Flonase. This seems to keep everything at bay.  She was on allergy shots with Dr. Nunzio Dyer with [facility] but they closed. She was on shots for 8 years with someone in [place].   Food Allergy Symptom History: She has recently developed an allergy to banana and watermelon resulting in tongue itchiness. She avoids peanuts and tree nuts without a problem. She has an up to date EpiPen.   GERD Symptom History: She does use pantoprazole daily for this.  This seems to control her symptoms very well.   Stinging Insect Allergy: She has a history of wasp allergy. Again EpiPen is up to date. She hsa never been tested for this in the past.   She has some labs with her today that show a normal CBC with diff with an AEC of 200. A CMP was normal. Lipid panel and TSH were both normal as well. These will be scanned into the system.   Otherwise, there have been no changes to her past medical history, surgical history, family history, or social history.    Review of systems otherwise negative other than that mentioned in the HPI.    Objective:   Blood pressure 122/70, pulse 88,  temperature 98.2 F (36.8 C), resp. rate 18, height 5\' 3"  (1.6 m), weight 173 lb 2 oz (78.5 kg), last menstrual period 11/04/2018, SpO2 95%. Body mass index is 30.67 kg/m.    Physical Exam Vitals reviewed.  Constitutional:      Appearance: She is well-developed.     Comments: Feisty.   HENT:     Head: Normocephalic and atraumatic.     Right Ear: Tympanic membrane, ear canal and external ear normal. No drainage, swelling or tenderness. Tympanic membrane is not injected, scarred, erythematous, retracted or bulging.     Left Ear: Tympanic membrane, ear canal and external ear normal. No drainage, swelling or tenderness. Tympanic membrane is not injected, scarred, erythematous, retracted or bulging.     Nose: No nasal deformity, septal deviation, mucosal edema or rhinorrhea.     Right Turbinates: Enlarged,  swollen and pale.     Left Turbinates: Enlarged, swollen and pale.     Right Sinus: No maxillary sinus tenderness or frontal sinus tenderness.     Left Sinus: No maxillary sinus tenderness or frontal sinus tenderness.     Mouth/Throat:     Mouth: Mucous membranes are not pale and not dry.     Pharynx: Uvula midline.  Eyes:     General:        Right eye: No discharge.        Left eye: No discharge.     Conjunctiva/sclera: Conjunctivae normal.     Right eye: Right conjunctiva is not injected. No chemosis.    Left eye: Left conjunctiva is not injected. No chemosis.    Pupils: Pupils are equal, round, and reactive to light.  Cardiovascular:     Rate and Rhythm: Normal rate and regular rhythm.     Heart sounds: Normal heart sounds.  Pulmonary:     Effort: Pulmonary effort is normal. No tachypnea, accessory muscle usage or respiratory distress.     Breath sounds: Normal breath sounds. No wheezing, rhonchi or rales.     Comments: Moving air well in all lung fields. No increased work of breathing noted.  Chest:     Chest wall: No tenderness.  Abdominal:     Tenderness: There is no  abdominal tenderness. There is no guarding or rebound.  Lymphadenopathy:     Head:     Right side of head: No submandibular, tonsillar or occipital adenopathy.     Left side of head: No submandibular, tonsillar or occipital adenopathy.     Cervical: No cervical adenopathy.  Skin:    Coloration: Skin is not pale.     Findings: No abrasion, erythema, petechiae or rash. Rash is not papular, urticarial or vesicular.  Neurological:     Mental Status: She is alert.  Psychiatric:        Behavior: Behavior is cooperative.      Diagnostic studies:    Spirometry: results normal (FEV1: 3.51/126%, FVC: 4.75/138%, FEV1/FVC: 74%).    Spirometry consistent with normal pattern.   Allergy Studies: labs sent instead        Malachi Bonds, MD  [facility]

## 2022-10-06 NOTE — Patient Instructions (Addendum)
1. Moderate persistent asthma, uncomplicated - Lung testing was well over 100%. - Copy of testing results provided.  - Daily controller medication(s): Singulair 10mg  daily - Prior to physical activity: albuterol 2 puffs 10-15 minutes before physical activity. - Rescue medications: albuterol 4 puffs every 4-6 hours as needed - Asthma control goals:  * Full participation in all desired activities (may need albuterol before activity) * Albuterol use two time or less a week on average (not counting use with activity) * Cough interfering with sleep two time or less a month * Oral steroids no more than once a year * No hospitalizations  2. Chronic rhinitis - We are going to get some blood work to look for environmental allergies. - We can always consider allergy shots in the future. - Continue with the Allegra daily. - Continue with the Singulair daily. - Continue with the Flonase daily.  - We can consider allergy shots in the future.   3. Anaphylactic shock due to food - Continue to avoid peanuts, tree nuts. And your triggering fruits. - Repeat labs ordered. - EpiPen is up to date.  - We will call you in 1-2 weeks with the results of the testing.   4. Insect sting allergy - We are going to get some labs to look for evidence of stinging insect allergies. - EpiPen is up to date.   5. Gastroesophageal reflux disease - Continue with pantoprazole.   6. Return in about 6 months (around 04/08/2023). You can have the follow up appointment with Dr. Dellis Anes or a Nurse Practicioner (our Nurse Practitioners are excellent and always have Physician oversight!).    Please inform us of any Emergency Department visits, hospitalizations, or changes in symptoms. Call us before going to the ED for breathing or allergy symptoms since we might be able to fit you in for a sick visit. Feel free to contact us anytime with any questions, problems, or concerns.  It was a pleasure to meet you  today!  Websites that have reliable patient information: 1. American Academy of Asthma, Allergy, and Immunology: www.aaaai.org 2. Food Allergy Research and Education (FARE): foodallergy.org 3. Mothers of Asthmatics: http://www.asthmacommunitynetwork.org 4. American College of Allergy, Asthma, and Immunology: www.acaai.org   COVID-19 Vaccine Information can be found at: [email or web address] For questions related to vaccine distribution or appointments, please email [email or web address] or call [phone].   We realize that you might be concerned about having an allergic reaction to the COVID19 vaccines. To help with that concern, WE ARE OFFERING THE COVID19 VACCINES IN OUR OFFICE! Ask the front desk for dates!     "Like" [place] on Facebook and Instagram for our latest updates!      A healthy democracy works best when [employer] participate! Make sure you are registered to vote! If you have moved or changed any of your contact information, you will need to get this updated before voting!  In some cases, you MAY be able to register to vote online: [email or web address]

## 2022-10-08 ENCOUNTER — Telehealth: Payer: Self-pay | Admitting: Allergy & Immunology

## 2022-10-08 ENCOUNTER — Other Ambulatory Visit: Payer: 59

## 2022-10-08 NOTE — Telephone Encounter (Signed)
Please see below.

## 2022-10-08 NOTE — Telephone Encounter (Signed)
Patient called stating our office told her to give [place] a call once she got her lab test done.

## 2022-10-09 ENCOUNTER — Encounter: Payer: Self-pay | Admitting: Allergy & Immunology

## 2022-10-09 MED ORDER — TRIAMCINOLONE ACETONIDE 0.1 % EX OINT: 1.0000 | TOPICAL_OINTMENT | Freq: Two times a day (BID) | CUTANEOUS | 1 refills | Status: DC

## 2022-10-10 NOTE — Telephone Encounter (Signed)
Ok we will call her when the results are back.   Malachi Bonds, MD [facility]

## 2022-10-11 ENCOUNTER — Encounter (HOSPITAL_COMMUNITY): Payer: Self-pay | Admitting: Hematology

## 2022-10-12 ENCOUNTER — Encounter: Payer: Self-pay | Admitting: "Endocrinology

## 2022-10-12 ENCOUNTER — Ambulatory Visit (INDEPENDENT_AMBULATORY_CARE_PROVIDER_SITE_OTHER): Payer: 59 | Admitting: "Endocrinology

## 2022-10-12 VITALS — BP 100/68 | HR 72 | Ht 63.0 in | Wt 175.0 lb

## 2022-10-12 DIAGNOSIS — E162 Hypoglycemia, unspecified: Secondary | ICD-10-CM | POA: Diagnosis not present

## 2022-10-12 DIAGNOSIS — E66811 Obesity, class 1: Secondary | ICD-10-CM | POA: Insufficient documentation

## 2022-10-12 DIAGNOSIS — E6609 Other obesity due to excess calories: Secondary | ICD-10-CM | POA: Diagnosis not present

## 2022-10-12 DIAGNOSIS — Z6831 Body mass index (BMI) 31.0-31.9, adult: Secondary | ICD-10-CM | POA: Diagnosis not present

## 2022-10-12 LAB — POCT GLYCOSYLATED HEMOGLOBIN (HGB A1C)

## 2022-10-12 MED ORDER — FREESTYLE LIBRE 3 SENSOR MISC: 1.0000 | 2 refills | Status: DC

## 2022-10-12 NOTE — Progress Notes (Signed)
Endocrinology Consult Note                                            10/12/2022, 3:48 PM   Subjective:    Patient ID: Stephanie Dyer, female    DOB: 1976-02-08, PCP Gabriel Earing, FNP   Past Medical History:  Diagnosis Date   Angio-edema    Ankylosing spondylitis (HCC)    Anxiety    Arthritis    Asthma    Cervical cancer (HCC) 1998   Elevated LFTs    Environmental allergies    GERD (gastroesophageal reflux disease)    History of gastric bypass    History of iron deficiency anemia    Hypoglycemia    Kidney stones    Lumbar back pain    Migraines    PCOS (polycystic ovarian syndrome)    Pseudotumor cerebri 2006   Thyroid cyst    Urticaria    Vitamin B12 deficiency 03/31/2020   Vitamin D deficiency    Past Surgical History:  Procedure Laterality Date   ADENOIDECTOMY     CESAREAN SECTION  2016   CHOLECYSTECTOMY  1998   COSMETIC SURGERY  2015   GASTRIC BYPASS  2014   KNEE SURGERY Left 2001   LITHOTRIPSY  2021   TONSILLECTOMY     TOTAL ABDOMINAL HYSTERECTOMY  10/2018   Social History   Socioeconomic History   Marital status: Single    Spouse name: Not on file   Number of children: 2   Years of education: Not on file   Highest education level: Not on file  Occupational History   Not on file  Tobacco Use   Smoking status: Former    Current packs/day: 0.00    Types: Cigarettes    Quit date: 11/27/2012    Years since quitting: 9.8   Smokeless tobacco: Never  Vaping Use   Vaping status: Every Day  Substance and Sexual Activity   Alcohol use: Not Currently   Drug use: Not Currently    Types: Marijuana   Sexual activity: Yes    Birth control/protection: Other-see comments  Other Topics Concern   Not on file  Social History Narrative   Makayla- 47, Alex- 7   Social Determinants of Health   Financial Resource Strain: Low Risk  (07/23/2022)   Overall Financial Resource Strain (CARDIA)    Difficulty of Paying Living Expenses: Not hard  at all  Food Insecurity: No Food Insecurity (07/23/2022)   Hunger Vital Sign    Worried About Running Out of Food in the Last Year: Never true    Ran Out of Food in the Last Year: Never true  Transportation Needs: No Transportation Needs (07/23/2022)   PRAPARE - Administrator, Civil Service (Medical): No    Lack of Transportation (Non-Medical): No  Physical Activity: Insufficiently Active (07/23/2022)   Exercise Vital Sign    Days of Exercise per Week: 2 days    Minutes of Exercise per Session: 30 min  Stress: No Stress Concern Present (07/23/2022)   [employer] of Occupational Health - Occupational Stress Questionnaire    Feeling of Stress : Not at all  Social Connections: Moderately Integrated (07/23/2022)   Social Connection and Isolation Panel [NHANES]    Frequency of Communication with Friends and Family: More than three times a week  Frequency of Social Gatherings with Friends and Family: More than three times a week    Attends Religious Services: More than 4 times per year    Active Member of [employer] or Organizations: Yes    Attends Engineer, structural: More than 4 times per year    Marital Status: Divorced   Family History  Problem Relation Age of Onset   Alcohol abuse Mother    Diabetes Mother    COPD Mother    Emphysema Mother    Asthma Mother    Hypertension Father    Heart disease Father    Heart attack Father    CVA Brother    Diabetes Brother    Hypertension Brother    Diabetes Maternal Grandmother    Parkinson's disease Maternal Grandmother    Macular degeneration Maternal Grandmother    Thyroid cancer Maternal Grandmother    Stomach cancer Maternal Grandmother    Cervical cancer Maternal Grandmother    Migraines Maternal Grandmother    Suicidality Maternal Grandfather    Autism Son    Hearing loss Son    Allergic rhinitis Neg Hx    Eczema Neg Hx    Urticaria Neg Hx    Breast cancer Neg Hx    Outpatient Encounter Medications  as of 10/12/2022  Medication Sig   Continuous Glucose Sensor (FREESTYLE LIBRE 3 SENSOR) MISC 1 Piece by Does not apply route every 14 (fourteen) days. Place 1 sensor on the skin every 14 days. Use to check glucose continuously   albuterol (2.5 MG/3ML) 0.083% NEBU 3 mL, albuterol (5 MG/ML) 0.5% NEBU 0.5 mL Inhale into the lungs.   albuterol (VENTOLIN HFA) 108 (90 Base) MCG/ACT inhaler Inhale 2 puffs into the lungs every 6 (six) hours as needed.   B-D 3CC LUER-LOK SYR 25GX1" 25G X 1" 3 ML MISC Use monthly for B12 injections   Cholecalciferol (VITAMIN D-3) 125 MCG (5000 UT) TABS Take 1 tablet by mouth 2 (two) times daily.   cyanocobalamin (VITAMIN B12) 1000 MCG/ML injection Inject 1 mL (1,000 mcg total) into the muscle every 21 ( twenty-one) days.   EPINEPHrine 0.3 mg/0.3 mL IJ SOAJ injection Inject 0.3 mg into the muscle as needed.   fexofenadine (ALLEGRA) 180 MG tablet Take 180 mg by mouth daily.   FLUoxetine (PROZAC) 20 MG capsule Take 1 capsule (20 mg total) by mouth daily.   fluticasone (FLONASE) 50 MCG/ACT nasal spray Place 2 sprays into both nostrils daily.   montelukast (SINGULAIR) 10 MG tablet Take 1 tablet (10 mg total) by mouth at bedtime.   Needles & Syringes MISC Injection supplies for every 3-week vitamin B12 injections x 6 months with 1 refill.   pantoprazole (PROTONIX) 40 MG tablet TAKE ONE TABLET BY MOUTH DAILY AT 9AM (NEEDS TO BE SEEN BEFORE NEXT REFILL)   topiramate (TOPAMAX) 50 MG tablet Take 1.5 tablets (75 mg total) by mouth daily.   [DISCONTINUED] triamcinolone ointment (KENALOG) 0.1 % Apply 1 Application topically 2 (two) times daily.   No facility-administered encounter medications on file as of 10/12/2022.   ALLERGIES: Allergies  Allergen Reactions   Codeine Anaphylaxis and Other (See Comments)    Other reaction(s): Projectile vomiting, throat closes Headache Other reaction(s): Other (See Comments) Headache   Morphine Anaphylaxis    Other reaction(s): Projectile  vomiting, throat clos Other reaction(s): Projectile vomiting, throat clos Other reaction(s): Projectile vomiting, throat closes Other reaction(s): Projectile vomiting, throat closes Other reaction(s): Projectile vomiting, throat clos Other reaction(s): Projectile vomiting, throat closes  Headache Other reaction(s): Projectile vomiting, throat clos Other reaction(s): Projectile vomiting, throat clos   Other Anaphylaxis    peanuts Other reaction(s): Other (See Comments) Headache 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   Banana    Fioricet [Butalbital-Apap-Caffeine] Other (See Comments)    Headache   Nsaids Other (See Comments) and Nausea And Vomiting    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   Vancomycin Itching and Other (See Comments)    Itching & flushed skin.   Venlafaxine Other (See Comments)    GI upset   Watermelon Flavor    Zolpidem Other (See Comments)    Ambien-Parasomnia   Latex Rash   Peanut-Containing Drug Products Rash   Shellfish Allergy Swelling and Rash    VACCINATION STATUS: Immunization History  Administered Date(s) Administered   Influenza-Unspecified 01/05/2021   Tdap 03/29/2015    HPI Stephanie Dyer is 47 y.o. female who presents today with a medical history as above. she is being seen in consultation for postprandial hypoglycemia requested by Gabriel Earing, FNP. -History is obtained directly from the patient as well as chart review.  She has no contributory medical history including obesity up to 400 pounds for which she underwent sleeve gastrectomy followed by Roux-en-Y gastric bypass in 2014.  That procedure helped her achieve significant weight loss down to 165 pounds, however most recently she is regaining 175 pounds.  She is never diagnosed with diabetes and not currently on medications for diabetes. -Over the last several weeks, she has experienced ongoing concern with hypoglycemia  happening mostly postprandial and symptomatic.  She denies blood glucose reading into the 40s however she has several in the 60s.  She monitors her blood glucose regularly.  Her point-of-care A1c today is 5.1%.  See above for her other medical problems. Her current medications include bronchodilators, Prozac, Topamax, and vitamin supplements.   Review of Systems  Constitutional: + Status post Roux-en-Y gastric bypass in 2012, recently fluctuating body weight with current BMI of 31 kg/m.   + no fatigue, no subjective hyperthermia, no subjective hypothermia Eyes: no blurry vision, no xerophthalmia ENT: no sore throat, no nodules palpated in throat, no dysphagia/odynophagia, no hoarseness Cardiovascular: no Chest Pain, no Shortness of Breath, no palpitations, no leg swelling Respiratory: no cough, no shortness of breath Gastrointestinal: no Nausea/Vomiting/Diarhhea Musculoskeletal: no muscle/joint aches Skin: no rashes Neurological: no tremors, no numbness, no tingling, no dizziness Psychiatric: no depression, no anxiety  Objective:       10/12/2022    1:44 PM 10/06/2022    9:10 AM 09/08/2022   10:48 AM  Vitals with BMI  Height 5\' 3"  5\' 3"    Weight 175 lbs 173 lbs 2 oz 175 lbs 4 oz  BMI 31.01 30.68 30.07  Systolic 100 122 098  Diastolic 68 70 76  Pulse 72 88 76    BP 100/68   Pulse 72   Ht 5\' 3"  (1.6 m)   Wt 175 lb (79.4 kg)   LMP 11/04/2018   BMI 31.00 kg/m   Wt Readings from Last 3 Encounters:  10/12/22 175 lb (79.4 kg)  10/06/22 173 lb 2 oz (78.5 kg)  09/08/22 175 lb 4.3 oz (79.5 kg)    Physical Exam  Constitutional:  Body mass index is 31 kg/m.,  not in acute distress, normal state of mind Eyes: PERRLA, EOMI, no exophthalmos ENT: moist mucous membranes, no gross thyromegaly, no gross cervical lymphadenopathy Cardiovascular: normal precordial activity, Regular Rate  and Rhythm, no Murmur/Rubs/Gallops Respiratory:  adequate breathing efforts, no gross chest deformity,  + scattered Rales on bilateral lung fields   Gastrointestinal: abdomen soft, Non -tender, No distension, Bowel Sounds present, no gross organomegaly Musculoskeletal: no gross deformities, strength intact in all four extremities, no peripheral edema Skin: moist, warm, no rashes Neurological: no tremor with outstretched hands, Deep tendon reflexes normal in bilateral lower extremities.  CMP ( most recent) CMP     Component Value Date/Time   NA 145 (H) 09/08/2022 0830   K 4.0 09/08/2022 0830   CL 109 (H) 09/08/2022 0830   CO2 22 09/08/2022 0830   GLUCOSE 86 09/08/2022 0830   GLUCOSE 97 03/17/2021 1046   BUN 11 09/08/2022 0830   CREATININE 0.66 09/08/2022 0830   CALCIUM 9.1 09/08/2022 0830   PROT 6.4 09/08/2022 0830   ALBUMIN 4.0 09/08/2022 0830   AST 17 09/08/2022 0830   ALT 20 09/08/2022 0830   ALKPHOS 110 09/08/2022 0830   BILITOT 0.6 09/08/2022 0830   EGFR 110 09/08/2022 0830   GFRNONAA >60 03/17/2021 1046     Diabetic Labs (most recent): Lab Results  Component Value Date   HGBA1C 5.0 05/20/2022     Lipid Panel ( most recent) Lipid Panel     Component Value Date/Time   CHOL 146 09/08/2022 0830   TRIG 58 09/08/2022 0830   HDL 65 09/08/2022 0830   CHOLHDL 2.2 09/08/2022 0830   CHOLHDL 2.4 03/17/2021 1046   VLDL 9 03/17/2021 1046   LDLCALC 69 09/08/2022 0830   LABVLDL 12 09/08/2022 0830      Lab Results  Component Value Date   TSH 1.980 09/08/2022   TSH 1.290 11/03/2021   TSH 0.855 03/17/2021   TSH 2.640 11/28/2019      Assessment & Plan:   1. Hypoglycemia   - Stephanie Dyer  is being seen at a kind request of Gabriel Earing, FNP. - I have reviewed her available  records and clinically evaluated the patient. 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 Multiple factors are involved in patient's presenting with concern including carotid artery Biotin, hyperinsulinemia, altered foremost, nutrient  malabsorption, dietary factors including consumption of high carbohydrate foods. Dietary modification is still the mainstay of treatment for her and there is no direct pharmaceutical agent to address this concern. I had a long discussion with the patient about changing her diet as follows. - she acknowledges that there is a room for improvement in her food and drink choices. - Suggestion is made for her to avoid simple carbohydrates  from her diet including Cakes, Sweet Desserts, Ice Cream, Soda (diet and regular), Sweet Tea, Candies, Chips, Cookies, Store Bought Juices, Alcohol , Artificial Sweeteners,  Coffee Creamer, and "Sugar-free" Products, Lemonade. This will help patient to have more stable blood glucose profile and potentially avoid unintended weight gain.  The following Lifestyle Medicine recommendations according to American College of Lifestyle Medicine  [facility]) were discussed and and offered to patient and she  agrees to start the journey:  A. Whole Foods, Plant-Based Nutrition comprising of fruits and vegetables, plant-based proteins, whole-grain carbohydrates was discussed in detail with the patient.   A list for source of those nutrients were also provided to the patient.  Patient will use only water or unsweetened tea for hydration. B.  She was to benefit from unprocessed meats preferably fish, chicken and egg whites instead of processed meats.  C.  A full  color page of  Calorie density of various food groups per pound showing examples of each food groups was provided to the patient.  This patient would benefit from CGM, discussed and prescribed a freestyle libre device for her. -In the meantime, she is advised to start monitoring blood glucose multiple times a day-before meals and at bedtime as well as when she is symptomatic before she corrects and return with her logs and meter for reevaluation.     - she is advised to maintain close follow up with Gabriel Earing, FNP for  primary care needs.   -Thank you for involving me in the care of this pleasant patient.  Time spent with the patient: 45  minutes, of which >50% was spent in  counseling her about her hypoglycemia and the rest in obtaining information about her symptoms, reviewing her previous labs/studies ( including abstractions from other facilities),  evaluations, and treatments,  and developing a plan to confirm diagnosis and long term treatment based on the latest standards of care/guidelines; and documenting her care.  [name] participated in the discussions, expressed understanding, and voiced agreement with the above plans.  All questions were answered to her satisfaction. she is encouraged to contact clinic should she have any questions or concerns prior to her return visit.  Follow up plan: Return in about 2 weeks (around 10/26/2022) for F/U with Meter/CGM /Logs Only - no Labs.   Marquis Lunch, MD [facility] [address] [place], [place] [address] Phone: [phone]  Fax: [phone]     10/12/2022, 3:48 PM  This note was partially dictated with voice recognition software. Similar sounding words can be transcribed inadequately or may not  be corrected upon review.

## 2022-10-13 ENCOUNTER — Telehealth: Payer: Self-pay | Admitting: "Endocrinology

## 2022-10-13 NOTE — Telephone Encounter (Signed)
-----   Message from Alfonse Spruce sent at 10/09/2022  7:27 AM EDT ----- I wrote the letter for the patient. Can someone call the patient and make sure that they have access to it through MyChart? Thanks!

## 2022-10-13 NOTE — Telephone Encounter (Signed)
Pt called and said that she is still waiting on a PA for her [facility] 3 Sensor. Have you seen anything?

## 2022-10-13 NOTE — Telephone Encounter (Signed)
I called patient and informed letter has been sent in mychart and she said that she could see it.

## 2022-10-14 ENCOUNTER — Encounter (HOSPITAL_COMMUNITY): Payer: Self-pay | Admitting: Hematology

## 2022-10-14 ENCOUNTER — Other Ambulatory Visit (HOSPITAL_COMMUNITY): Payer: Self-pay

## 2022-10-15 NOTE — Telephone Encounter (Signed)
Spoke with pharmacist at [facility] in [place]. She stated she needed updated information regarding pt's medicare and if she had medicare part B. Contacted pt, advised her to call the pharmacy to update her insurance information for her Medicare. Pt voiced understanding.

## 2022-10-15 NOTE — Telephone Encounter (Signed)
She is saying she did call [facility] just now and still needs a Prior Authorization.

## 2022-10-15 NOTE — Telephone Encounter (Signed)
Did you mean to send this to me?  Looks like she is [facility] patient.

## 2022-10-18 NOTE — Telephone Encounter (Signed)
Spoke with pharmacist at [facility], she stated they are not able to file the CGM sensors under Medicare Part B but they use a mail order pharmacy that if pt qualifies will file with insurance and mail sensors directly to pt. Pharmacist stated she would start this process. Pt made aware and voiced understanding.

## 2022-10-20 ENCOUNTER — Encounter (HOSPITAL_COMMUNITY): Payer: Self-pay | Admitting: Hematology

## 2022-10-21 ENCOUNTER — Encounter: Payer: Self-pay | Admitting: Allergy & Immunology

## 2022-10-21 ENCOUNTER — Encounter (HOSPITAL_COMMUNITY): Payer: Self-pay | Admitting: Hematology

## 2022-10-21 NOTE — Addendum Note (Signed)
Addended by: Briant Cedar L on: 10/21/2022 12:16 PM   Modules accepted: Orders

## 2022-10-22 NOTE — Telephone Encounter (Signed)
Appointment has been made and confirmed.

## 2022-10-26 ENCOUNTER — Ambulatory Visit (INDEPENDENT_AMBULATORY_CARE_PROVIDER_SITE_OTHER): Payer: Medicare Other | Admitting: Family Medicine

## 2022-10-26 ENCOUNTER — Encounter: Payer: Self-pay | Admitting: Family Medicine

## 2022-10-26 ENCOUNTER — Other Ambulatory Visit (HOSPITAL_COMMUNITY)
Admission: RE | Admit: 2022-10-26 | Discharge: 2022-10-26 | Disposition: A | Discharge status: Home / Self Care | Payer: Medicare Other | Source: Ambulatory Visit | Attending: Family Medicine | Admitting: Family Medicine

## 2022-10-26 VITALS — BP 94/59 | HR 76 | Temp 97.8°F | Ht 63.0 in | Wt 173.6 lb

## 2022-10-26 DIAGNOSIS — Z202 Contact with and (suspected) exposure to infections with a predominantly sexual mode of transmission: Secondary | ICD-10-CM | POA: Insufficient documentation

## 2022-10-26 DIAGNOSIS — R300 Dysuria: Secondary | ICD-10-CM

## 2022-10-26 LAB — URINALYSIS, COMPLETE
Bilirubin, UA: NEGATIVE
Glucose, UA: NEGATIVE
Ketones, UA: NEGATIVE
Nitrite, UA: NEGATIVE
Protein,UA: NEGATIVE
RBC, UA: NEGATIVE
Specific Gravity, UA: 1.02 (ref 1.005–1.030)
Urobilinogen, Ur: 0.2 mg/dL (ref 0.2–1.0)
pH, UA: 5.5 (ref 5.0–7.5)

## 2022-10-26 LAB — MICROSCOPIC EXAMINATION
Bacteria, UA: NONE SEEN
RBC, Urine: NONE SEEN /hpf (ref 0–2)
Renal Epithel, UA: NONE SEEN /hpf
Yeast, UA: NONE SEEN

## 2022-10-26 MED ORDER — FLUCONAZOLE 150 MG PO TABS: 150.0000 mg | ORAL_TABLET | Freq: Once | ORAL | 0 refills | Status: AC

## 2022-10-26 MED ORDER — CIPROFLOXACIN HCL 500 MG PO TABS: 500.0000 mg | ORAL_TABLET | Freq: Two times a day (BID) | ORAL | 0 refills | Status: DC

## 2022-10-26 NOTE — Progress Notes (Signed)
Subjective:  Patient ID: Stephanie Dyer, female    DOB: 31-Dec-1975  Age: 47 y.o. MRN: [id number]  CC: Dysuria   HPI JHANAE RATHBUN presents for burning with urination and frequency for several days. Denies fever . No flank pain. No nausea, vomiting. Has suprapubic pain. Gets several a year. Gets hematuria.   Pt. Notes recent break up with boyfriend who had 4 other sexual relationships ongoing while they were together.      10/26/2022    3:17 PM 10/26/2022    3:07 PM 09/02/2022   10:12 AM  Depression screen PHQ 2/9  Decreased Interest 1 0 0  Down, Depressed, Hopeless 1 0 0  PHQ - 2 Score 2 0 0  Altered sleeping 3  3  Tired, decreased energy 3  3  Change in appetite 3  1  Feeling bad or failure about yourself  0  0  Trouble concentrating 2  1  Moving slowly or fidgety/restless 0  0  Suicidal thoughts 1  0  PHQ-9 Score 14  8  Difficult doing work/chores Somewhat difficult  Not difficult at all    History Lamyra has a past medical history of Angio-edema, Ankylosing spondylitis (HCC), Anxiety, Arthritis, Asthma, Cervical cancer (HCC) (1998), Elevated LFTs, Environmental allergies, GERD (gastroesophageal reflux disease), History of gastric bypass, History of iron deficiency anemia, Hypoglycemia, Kidney stones, Lumbar back pain, Migraines, PCOS (polycystic ovarian syndrome), Pseudotumor cerebri (2006), Thyroid cyst, Urticaria, Vitamin B12 deficiency (03/31/2020), and Vitamin D deficiency.   She has a past surgical history that includes Tonsillectomy; Cholecystectomy (1998); Gastric bypass (2014); Cosmetic surgery (2015); Knee surgery (Left, 2001); Lithotripsy (2021); Cesarean section (2016); Adenoidectomy; and Total abdominal hysterectomy (10/2018).   Her family history includes Alcohol abuse in her mother; Asthma in her mother; Autism in her son; COPD in her mother; CVA in her brother; Cervical cancer in her maternal grandmother; Diabetes in her brother, maternal  grandmother, and mother; Emphysema in her mother; Hearing loss in her son; Heart attack in her father; Heart disease in her father; Hypertension in her brother and father; Macular degeneration in her maternal grandmother; Migraines in her maternal grandmother; Parkinson's disease in her maternal grandmother; Stomach cancer in her maternal grandmother; Suicidality in her maternal grandfather; Thyroid cancer in her maternal grandmother.She reports that she quit smoking about 9 years ago. Her smoking use included cigarettes. She has never used smokeless tobacco. She reports that she does not currently use alcohol. She reports that she does not currently use drugs after having used the following drugs: Marijuana.    ROS Review of Systems  Constitutional:  Negative for chills, diaphoresis and fever.  HENT:  Negative for congestion.   Eyes:  Negative for visual disturbance.  Respiratory:  Negative for cough and shortness of breath.   Cardiovascular:  Negative for chest pain and palpitations.  Gastrointestinal:  Negative for constipation, diarrhea and nausea.  Genitourinary:  Positive for dysuria, frequency and urgency. Negative for decreased urine volume, flank pain, hematuria, menstrual problem and pelvic pain.  Musculoskeletal:  Negative for arthralgias and joint swelling.  Skin:  Negative for rash.  Neurological:  Negative for dizziness and numbness.    Objective:  BP (!) 94/59   Pulse 76   Temp 97.8 F (36.6 C)   Ht 5\' 3"  (1.6 m)   Wt 173 lb 9.6 oz (78.7 kg)   LMP 11/04/2018   SpO2 98%   BMI 30.75 kg/m   BP Readings from Last 3 Encounters:  10/26/22 Marland Kitchen)  16/10  10/12/22 100/68  10/06/22 122/70    Wt Readings from Last 3 Encounters:  10/26/22 173 lb 9.6 oz (78.7 kg)  10/12/22 175 lb (79.4 kg)  10/06/22 173 lb 2 oz (78.5 kg)     Physical Exam Constitutional:      Appearance: She is well-developed.  HENT:     Head: Normocephalic and atraumatic.  Cardiovascular:     Rate and  Rhythm: Normal rate and regular rhythm.     Heart sounds: No murmur heard. Pulmonary:     Effort: Pulmonary effort is normal.     Breath sounds: Normal breath sounds.  Abdominal:     General: Bowel sounds are normal.     Palpations: Abdomen is soft. There is no mass.     Tenderness: There is no abdominal tenderness. There is no guarding or rebound.  Musculoskeletal:        General: No tenderness.  Skin:    General: Skin is warm and dry.  Neurological:     Mental Status: She is alert and oriented to person, place, and time.  Psychiatric:        Behavior: Behavior normal.       Assessment & Plan:   Ericka Kelzer" was seen today for dysuria.  Diagnoses and all orders for this visit:  Dysuria -     Urinalysis, Complete -     Cancel: Urine cytology ancillary only -     HepB+HepC+HIV Panel -     RPR -     ciprofloxacin (CIPRO) 500 MG tablet; Take 1 tablet (500 mg total) by mouth 2 (two) times daily. -     Cancel: WET PREP FOR TRICH, YEAST, CLUE  Possible exposure to STI -     Cancel: Urine cytology ancillary only -     HepB+HepC+HIV Panel -     Cancel: WET PREP FOR TRICH, YEAST, CLUE -     Urine cytology ancillary only  STD exposure  Other orders -     fluconazole (DIFLUCAN) 150 MG tablet; Take 1 tablet (150 mg total) by mouth once for 1 dose. At onset of symptoms. Repeat at end of treatment -     Microscopic Examination       I am having Rosenda C. Sanchez-Massey "Mandy" start on ciprofloxacin and fluconazole. I am also having her maintain her Vitamin D-3, Needles & Syringes, (albuterol (2.5 MG/3ML) 0.083% NEBU 3 mL, albuterol (5 MG/ML) 0.5% NEBU 0.5 mL), fexofenadine, cyanocobalamin, EPINEPHrine, FLUoxetine, montelukast, albuterol, fluticasone, topiramate, pantoprazole, B-D 3CC LUER-LOK SYR 25GX1", and FreeStyle Libre 3 Sensor.  Allergies as of 10/26/2022       Reactions   Codeine Anaphylaxis, Other (See Comments)   Other reaction(s): Projectile vomiting, throat  closes Headache Other reaction(s): Other (See Comments) Headache   Morphine Anaphylaxis   Other reaction(s): Projectile vomiting, throat clos Other reaction(s): Projectile vomiting, throat clos Other reaction(s): Projectile vomiting, throat closes Other reaction(s): Projectile vomiting, throat closes Other reaction(s): Projectile vomiting, throat clos Other reaction(s): Projectile vomiting, throat closes Headache Other reaction(s): Projectile vomiting, throat clos Other reaction(s): Projectile vomiting, throat clos   Other Anaphylaxis   peanuts Other reaction(s): Other (See Comments) Headache 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   Banana    Fioricet [butalbital-apap-caffeine] Other (See Comments)   Headache   Nsaids Other (See Comments), Nausea And Vomiting   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   Vancomycin Itching, Other (See Comments)   Itching &  flushed skin.   Venlafaxine Other (See Comments)   GI upset   Watermelon Flavor    Zolpidem Other (See Comments)   Ambien-Parasomnia   Latex Rash   Peanut-containing Drug Products Rash   Shellfish Allergy Swelling, Rash        Medication List        Accurate as of October 26, 2022  5:52 PM. If you have any questions, ask your nurse or doctor.          albuterol (2.5 MG/3ML) 0.083% NEBU 3 mL, albuterol (5 MG/ML) 0.5% NEBU 0.5 mL Inhale into the lungs.   albuterol 108 (90 Base) MCG/ACT inhaler Commonly known as: VENTOLIN HFA Inhale 2 puffs into the lungs every 6 (six) hours as needed.   B-D 3CC LUER-LOK SYR 25GX1" 25G X 1" 3 ML Misc Generic drug: SYRINGE-NEEDLE (DISP) 3 ML Use monthly for B12 injections   ciprofloxacin 500 MG tablet Commonly known as: Cipro Take 1 tablet (500 mg total) by mouth 2 (two) times daily. Started by: Daylee Delahoz   cyanocobalamin 1000 MCG/ML injection Commonly known as: VITAMIN B12 Inject 1 mL (1,000 mcg total) into the muscle  every 21 ( twenty-one) days.   EPINEPHrine 0.3 mg/0.3 mL Soaj injection Commonly known as: EPI-PEN Inject 0.3 mg into the muscle as needed.   fexofenadine 180 MG tablet Commonly known as: ALLEGRA Take 180 mg by mouth daily.   fluconazole 150 MG tablet Commonly known as: DIFLUCAN Take 1 tablet (150 mg total) by mouth once for 1 dose. At onset of symptoms. Repeat at end of treatment Started by: Zully Frane   FLUoxetine 20 MG capsule Commonly known as: PROZAC Take 1 capsule (20 mg total) by mouth daily.   fluticasone 50 MCG/ACT nasal spray Commonly known as: FLONASE Place 2 sprays into both nostrils daily.   FreeStyle Libre 3 Sensor Misc 1 Piece by Does not apply route every 14 (fourteen) days. Place 1 sensor on the skin every 14 days. Use to check glucose continuously   montelukast 10 MG tablet Commonly known as: SINGULAIR Take 1 tablet (10 mg total) by mouth at bedtime.   Needles & Syringes Misc Injection supplies for every 3-week vitamin B12 injections x 6 months with 1 refill.   pantoprazole 40 MG tablet Commonly known as: PROTONIX TAKE ONE TABLET BY MOUTH DAILY AT 9AM (NEEDS TO BE SEEN BEFORE NEXT REFILL)   topiramate 50 MG tablet Commonly known as: TOPAMAX Take 1.5 tablets (75 mg total) by mouth daily.   Vitamin D-3 125 MCG (5000 UT) Tabs Take 1 tablet by mouth 2 (two) times daily.         Follow-up: Return if symptoms worsen or fail to improve.  Mechele Claude, M.D.

## 2022-10-27 ENCOUNTER — Encounter: Payer: Self-pay | Admitting: "Endocrinology

## 2022-10-27 ENCOUNTER — Ambulatory Visit (INDEPENDENT_AMBULATORY_CARE_PROVIDER_SITE_OTHER): Payer: Medicare Other | Admitting: "Endocrinology

## 2022-10-27 VITALS — BP 108/70 | HR 76 | Ht 63.0 in | Wt 174.0 lb

## 2022-10-27 DIAGNOSIS — E162 Hypoglycemia, unspecified: Secondary | ICD-10-CM

## 2022-10-27 DIAGNOSIS — Z6831 Body mass index (BMI) 31.0-31.9, adult: Secondary | ICD-10-CM

## 2022-10-27 DIAGNOSIS — E6609 Other obesity due to excess calories: Secondary | ICD-10-CM

## 2022-10-27 LAB — RPR: RPR Ser Ql: NONREACTIVE

## 2022-10-27 LAB — HEPB+HEPC+HIV PANEL
HIV Screen 4th Generation wRfx: NONREACTIVE
Hep B C IgM: NEGATIVE
Hep B Core Total Ab: NEGATIVE
Hep B E Ab: NONREACTIVE
Hep B E Ag: NEGATIVE
Hep B Surface Ab, Qual: REACTIVE
Hep C Virus Ab: NONREACTIVE
Hepatitis B Surface Ag: NEGATIVE

## 2022-10-27 MED ORDER — ACARBOSE 25 MG PO TABS: 25.0000 mg | ORAL_TABLET | Freq: Three times a day (TID) | ORAL | 1 refills | Status: DC

## 2022-10-27 NOTE — Progress Notes (Signed)
10/27/2022, 10:08 AM   Endocrinology follow-up note  Subjective:    Patient ID: Stephanie Dyer, female    DOB: 23-May-1975, PCP Gabriel Earing, FNP   Past Medical History:  Diagnosis Date   Angio-edema    Ankylosing spondylitis (HCC)    Anxiety    Arthritis    Asthma    Cervical cancer (HCC) 1998   Elevated LFTs    Environmental allergies    GERD (gastroesophageal reflux disease)    History of gastric bypass    History of iron deficiency anemia    Hypoglycemia    Kidney stones    Lumbar back pain    Migraines    PCOS (polycystic ovarian syndrome)    Pseudotumor cerebri 2006   Thyroid cyst    Urticaria    Vitamin B12 deficiency 03/31/2020   Vitamin D deficiency    Past Surgical History:  Procedure Laterality Date   ADENOIDECTOMY     CESAREAN SECTION  2016   CHOLECYSTECTOMY  1998   COSMETIC SURGERY  2015   GASTRIC BYPASS  2014   KNEE SURGERY Left 2001   LITHOTRIPSY  2021   TONSILLECTOMY     TOTAL ABDOMINAL HYSTERECTOMY  10/2018   Social History   Socioeconomic History   Marital status: Single    Spouse name: Not on file   Number of children: 2   Years of education: Not on file   Highest education level: Not on file  Occupational History   Not on file  Tobacco Use   Smoking status: Former    Current packs/day: 0.00    Types: Cigarettes    Quit date: 11/27/2012    Years since quitting: 9.9   Smokeless tobacco: Never  Vaping Use   Vaping status: Every Day  Substance and Sexual Activity   Alcohol use: Not Currently   Drug use: Not Currently    Types: Marijuana   Sexual activity: Yes    Birth control/protection: Other-see comments  Other Topics Concern   Not on file  Social History Narrative   Makayla- 5, Alex- 5   Social Determinants of Health   Financial Resource Strain: Low Risk  (07/23/2022)   Overall Financial Resource Strain (CARDIA)    Difficulty of Paying Living Expenses:  Not hard at all  Food Insecurity: No Food Insecurity (07/23/2022)   Hunger Vital Sign    Worried About Running Out of Food in the Last Year: Never true    Ran Out of Food in the Last Year: Never true  Transportation Needs: No Transportation Needs (07/23/2022)   PRAPARE - Administrator, Civil Service (Medical): No    Lack of Transportation (Non-Medical): No  Physical Activity: Insufficiently Active (07/23/2022)   Exercise Vital Sign    Days of Exercise per Week: 2 days    Minutes of Exercise per Session: 30 min  Stress: No Stress Concern Present (07/23/2022)   [employer] of Occupational Health - Occupational Stress Questionnaire    Feeling of Stress : Not at all  Social Connections: Moderately Integrated (07/23/2022)   Social Connection and Isolation Panel [NHANES]    Frequency of Communication with Friends and Family: More than three times a week  Frequency of Social Gatherings with Friends and Family: More than three times a week    Attends Religious Services: More than 4 times per year    Active Member of [employer] or Organizations: Yes    Attends Engineer, structural: More than 4 times per year    Marital Status: Divorced   Family History  Problem Relation Age of Onset   Alcohol abuse Mother    Diabetes Mother    COPD Mother    Emphysema Mother    Asthma Mother    Hypertension Father    Heart disease Father    Heart attack Father    CVA Brother    Diabetes Brother    Hypertension Brother    Diabetes Maternal Grandmother    Parkinson's disease Maternal Grandmother    Macular degeneration Maternal Grandmother    Thyroid cancer Maternal Grandmother    Stomach cancer Maternal Grandmother    Cervical cancer Maternal Grandmother    Migraines Maternal Grandmother    Suicidality Maternal Grandfather    Autism Son    Hearing loss Son    Allergic rhinitis Neg Hx    Eczema Neg Hx    Urticaria Neg Hx    Breast cancer Neg Hx    Outpatient Encounter  Medications as of 10/27/2022  Medication Sig   acarbose (PRECOSE) 25 MG tablet Take 1 tablet (25 mg total) by mouth 3 (three) times daily with meals.   albuterol (2.5 MG/3ML) 0.083% NEBU 3 mL, albuterol (5 MG/ML) 0.5% NEBU 0.5 mL Inhale into the lungs.   albuterol (VENTOLIN HFA) 108 (90 Base) MCG/ACT inhaler Inhale 2 puffs into the lungs every 6 (six) hours as needed.   B-D 3CC LUER-LOK SYR 25GX1" 25G X 1" 3 ML MISC Use monthly for B12 injections   Cholecalciferol (VITAMIN D-3) 125 MCG (5000 UT) TABS Take 1 tablet by mouth 2 (two) times daily.   ciprofloxacin (CIPRO) 500 MG tablet Take 1 tablet (500 mg total) by mouth 2 (two) times daily.   Continuous Glucose Sensor (FREESTYLE LIBRE 3 SENSOR) MISC 1 Piece by Does not apply route every 14 (fourteen) days. Place 1 sensor on the skin every 14 days. Use to check glucose continuously   cyanocobalamin (VITAMIN B12) 1000 MCG/ML injection Inject 1 mL (1,000 mcg total) into the muscle every 21 ( twenty-one) days.   EPINEPHrine 0.3 mg/0.3 mL IJ SOAJ injection Inject 0.3 mg into the muscle as needed.   fexofenadine (ALLEGRA) 180 MG tablet Take 180 mg by mouth daily.   FLUoxetine (PROZAC) 20 MG capsule Take 1 capsule (20 mg total) by mouth daily.   fluticasone (FLONASE) 50 MCG/ACT nasal spray Place 2 sprays into both nostrils daily.   montelukast (SINGULAIR) 10 MG tablet Take 1 tablet (10 mg total) by mouth at bedtime.   Needles & Syringes MISC Injection supplies for every 3-week vitamin B12 injections x 6 months with 1 refill.   pantoprazole (PROTONIX) 40 MG tablet TAKE ONE TABLET BY MOUTH DAILY AT 9AM (NEEDS TO BE SEEN BEFORE NEXT REFILL)   topiramate (TOPAMAX) 50 MG tablet Take 1.5 tablets (75 mg total) by mouth daily.   No facility-administered encounter medications on file as of 10/27/2022.   ALLERGIES: Allergies  Allergen Reactions   Codeine Anaphylaxis and Other (See Comments)    Other reaction(s): Projectile vomiting, throat  closes Headache Other reaction(s): Other (See Comments) Headache   Morphine Anaphylaxis    Other reaction(s): Projectile vomiting, throat clos Other reaction(s): Projectile vomiting, throat clos  Other reaction(s): Projectile vomiting, throat closes Other reaction(s): Projectile vomiting, throat closes Other reaction(s): Projectile vomiting, throat clos Other reaction(s): Projectile vomiting, throat closes Headache Other reaction(s): Projectile vomiting, throat clos Other reaction(s): Projectile vomiting, throat clos   Other Anaphylaxis    peanuts Other reaction(s): Other (See Comments) Headache 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   Banana    Fioricet [Butalbital-Apap-Caffeine] Other (See Comments)    Headache   Nsaids Other (See Comments) and Nausea And Vomiting    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   Vancomycin Itching and Other (See Comments)    Itching & flushed skin.   Venlafaxine Other (See Comments)    GI upset   Watermelon Flavor    Zolpidem Other (See Comments)    Ambien-Parasomnia   Latex Rash   Peanut-Containing Drug Products Rash   Shellfish Allergy Swelling and Rash    VACCINATION STATUS: Immunization History  Administered Date(s) Administered   Influenza-Unspecified 01/05/2021   Tdap 03/29/2015    HPI Stephanie Dyer is 47 y.o. female who presents today with a medical history as above. she is being seen in follow-up after she was seen in consultation for postprandial hypoglycemia requested by Gabriel Earing, FNP. -See note from last visit.   -Over the last several weeks, she has experienced ongoing concern with hypoglycemia happening mostly postprandial and symptomatic.  She denies blood glucose reading into the 40s however she has several in the 60s.  After her last visit, she was advised to monitor blood glucose multiple times a day.  She documented significant hypoglycemia randomly  including in the 60s at fasting, and 37, 42, 64, 54, happening randomly postprandially. Her insurance did not provide coverage for the CGM prescribed.  She has contributory medical history including obesity up to 400 pounds for which she underwent sleeve gastrectomy followed by Roux-en-Y gastric bypass in 2014.  That procedure helped her achieve significant weight loss down to 165 pounds, however most recently she is regaining 175 pounds.  She is never diagnosed with diabetes and not currently on medications for diabetes.  She monitors her blood glucose regularly.  Her point-of-care A1c today is 5.1%.  See above for her other medical problems. Her current medications include bronchodilators, Prozac, Topamax, and vitamin supplements.   Review of Systems  Constitutional: + Status post Roux-en-Y gastric bypass in 2012, recently fluctuating body weight with current BMI of 31 kg/m.   + no fatigue, no subjective hyperthermia, no subjective hypothermia   Objective:       10/27/2022    8:20 AM 10/26/2022    3:07 PM 10/12/2022    1:44 PM  Vitals with BMI  Height 5\' 3"  5\' 3"  5\' 3"   Weight 174 lbs 173 lbs 10 oz 175 lbs  BMI 30.83 30.76 31.01  Systolic 108 94 100  Diastolic 70 59 68  Pulse 76 76 72    BP 108/70   Pulse 76   Ht 5\' 3"  (1.6 m)   Wt 174 lb (78.9 kg)   LMP 11/04/2018   BMI 30.82 kg/m   Wt Readings from Last 3 Encounters:  10/27/22 174 lb (78.9 kg)  10/26/22 173 lb 9.6 oz (78.7 kg)  10/12/22 175 lb (79.4 kg)    Physical Exam  Constitutional:  Body mass index is 30.82 kg/m.,  not in acute distress, normal state of mind Eyes: PERRLA, EOMI, no exophthalmos ENT: moist mucous membranes, no gross thyromegaly, no gross cervical lymphadenopathy  Cardiovascular: normal precordial activity, Regular Rate and Rhythm, no Murmur/Rubs/Gallops   CMP ( most recent) CMP     Component Value Date/Time   NA 145 (H) 09/08/2022 0830   K 4.0 09/08/2022 0830   CL 109 (H) 09/08/2022 0830    CO2 22 09/08/2022 0830   GLUCOSE 86 09/08/2022 0830   GLUCOSE 97 03/17/2021 1046   BUN 11 09/08/2022 0830   CREATININE 0.66 09/08/2022 0830   CALCIUM 9.1 09/08/2022 0830   PROT 6.4 09/08/2022 0830   ALBUMIN 4.0 09/08/2022 0830   AST 17 09/08/2022 0830   ALT 20 09/08/2022 0830   ALKPHOS 110 09/08/2022 0830   BILITOT 0.6 09/08/2022 0830   EGFR 110 09/08/2022 0830   GFRNONAA >60 03/17/2021 1046     Diabetic Labs (most recent): Lab Results  Component Value Date   HGBA1C 5.0 05/20/2022     Lipid Panel ( most recent) Lipid Panel     Component Value Date/Time   CHOL 146 09/08/2022 0830   TRIG 58 09/08/2022 0830   HDL 65 09/08/2022 0830   CHOLHDL 2.2 09/08/2022 0830   CHOLHDL 2.4 03/17/2021 1046   VLDL 9 03/17/2021 1046   LDLCALC 69 09/08/2022 0830   LABVLDL 12 09/08/2022 0830      Lab Results  Component Value Date   TSH 1.980 09/08/2022   TSH 1.290 11/03/2021   TSH 0.855 03/17/2021   TSH 2.640 11/28/2019      Assessment & Plan:   1. Hypoglycemia-post gastric bypass  2-history of morbid obesity status post gastric bypass surgery   - Stephanie Dyer  is being seen at a kind request of Lequita Halt, Tiffany M, FNP. - I have reviewed her available blood glucose records and available records and clinically evaluated the patient. 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 Multiple factors are involved in patient's presenting with concern postprandial/random hypoglycemia,  hyperinsulinemia, altered gut hormones,  nutrient malabsorption, dietary factors including consumption of high carbohydrate foods.  Dietary modification is still the mainstay of treatment . -Being considered for acarbose 25 mg p.o. 3 times daily AC   I had a long discussion with the patient about changing her diet as follows. - she acknowledges that there is a room for improvement in her food and drink choices. - Suggestion is made for her to avoid simple  carbohydrates  from her diet including Cakes, Sweet Desserts, Ice Cream, Soda (diet and regular), Sweet Tea, Candies, Chips, Cookies, Store Bought Juices, Alcohol , Artificial Sweeteners,  Coffee Creamer, and "Sugar-free" Products, Lemonade. This will help patient to have more stable blood glucose profile and potentially avoid unintended weight gain.  The following Lifestyle Medicine recommendations according to American College of Lifestyle Medicine  [facility]) were discussed and and offered to patient and she  agrees to start the journey:  A. Whole Foods, Plant-Based Nutrition comprising of fruits and vegetables, plant-based proteins, whole-grain carbohydrates was discussed in detail with the patient.   A list for source of those nutrients were also provided to the patient.  Patient will use only water or unsweetened tea for hydration. B.  The need to stay away from risky substances including alcohol, smoking; obtaining 7 to 9 hours of restorative sleep, at least 150 minutes of moderate intensity exercise weekly, the importance of healthy social connections,  and stress management techniques were discussed. C.  A full color page of  Calorie density of various food groups per pound showing examples  of each food groups was provided to the patient.  This patient will significantly benefit from a CGM at least temporarily.  I gave her a sample of CGM and applied the first sensor on her.  I will refresher prescription for same. -In the meantime, she is advised to start monitoring blood glucose at fasting, when symptomatic, and before bedtime until she returns for a visit in 3 months.     - she is advised to maintain close follow up with Gabriel Earing, FNP for primary care needs.   I spent  25  minutes in the care of the patient today including review of labs from Thyroid Function, CMP, and other relevant labs ; imaging/biopsy records (current and previous including abstractions from other facilities);  face-to-face time discussing  her lab results and symptoms, medications doses, her options of short and long term treatment based on the latest standards of care / guidelines;   and documenting the encounter.  [name]  participated in the discussions, expressed understanding, and voiced agreement with the above plans.  All questions were answered to her satisfaction. she is encouraged to contact clinic should she have any questions or concerns prior to her return visit.   Follow up plan: Return in about 3 months (around 01/27/2023) for F/U with Pre-visit Labs.   Marquis Lunch, MD [facility] [address] [place], [place] [address] Phone: [phone]  Fax: [phone]     10/27/2022, 10:08 AM  This note was partially dictated with voice recognition software. Similar sounding words can be transcribed inadequately or may not  be corrected upon review.

## 2022-10-28 ENCOUNTER — Telehealth: Payer: Self-pay

## 2022-10-28 LAB — URINE CYTOLOGY ANCILLARY ONLY
Chlamydia: NEGATIVE
Comment: NEGATIVE
Comment: NEGATIVE
Comment: NORMAL
Neisseria Gonorrhea: NEGATIVE
Trichomonas: NEGATIVE

## 2022-10-28 NOTE — Telephone Encounter (Signed)
[facility] cytology department is calling to let us know that the urine test that was ordered for this patient cannot test for candida or gardnerella but it can test for all the other disease.  Those two now require a swab.  They will go ahead with the urine test but if you want to test for these two the patient will need to come back in for another visit.

## 2022-10-28 NOTE — Progress Notes (Signed)
Hello Tekisha,  Your lab result is normal and/or stable.Some minor variations that are not significant are commonly marked abnormal, but do not represent any medical problem for you.  Best regards, Claretta Fraise, M.D.

## 2022-11-17 ENCOUNTER — Other Ambulatory Visit: Payer: Self-pay | Admitting: Family Medicine

## 2022-11-17 ENCOUNTER — Other Ambulatory Visit: Payer: Self-pay | Admitting: Allergy & Immunology

## 2022-11-17 DIAGNOSIS — Z9109 Other allergy status, other than to drugs and biological substances: Secondary | ICD-10-CM

## 2022-11-22 ENCOUNTER — Encounter (HOSPITAL_COMMUNITY): Payer: Self-pay | Admitting: Hematology

## 2022-11-23 ENCOUNTER — Encounter (HOSPITAL_COMMUNITY): Payer: Self-pay | Admitting: Hematology

## 2022-11-24 MED ORDER — TRIAMCINOLONE ACETONIDE 0.1 % EX OINT: 1.0000 | TOPICAL_OINTMENT | Freq: Two times a day (BID) | CUTANEOUS | 0 refills | Status: DC

## 2022-11-24 NOTE — Addendum Note (Signed)
Addended by: Orson Aloe on: 11/24/2022 02:54 PM   Modules accepted: Orders

## 2022-11-29 ENCOUNTER — Encounter: Payer: Self-pay | Admitting: Nurse Practitioner

## 2022-11-29 ENCOUNTER — Ambulatory Visit: Payer: Medicare Other | Admitting: Nurse Practitioner

## 2022-11-29 VITALS — BP 97/66 | HR 78 | Temp 97.5°F | Ht 63.0 in | Wt 168.0 lb

## 2022-11-29 DIAGNOSIS — B9689 Other specified bacterial agents as the cause of diseases classified elsewhere: Secondary | ICD-10-CM | POA: Diagnosis not present

## 2022-11-29 DIAGNOSIS — N898 Other specified noninflammatory disorders of vagina: Secondary | ICD-10-CM | POA: Insufficient documentation

## 2022-11-29 DIAGNOSIS — N760 Acute vaginitis: Secondary | ICD-10-CM | POA: Diagnosis not present

## 2022-11-29 LAB — WET PREP FOR TRICH, YEAST, CLUE
Clue Cell Exam: POSITIVE — AB
Trichomonas Exam: NEGATIVE
Yeast Exam: NEGATIVE

## 2022-11-29 MED ORDER — METRONIDAZOLE 500 MG PO TABS: 500.0000 mg | ORAL_TABLET | Freq: Two times a day (BID) | ORAL | 0 refills | Status: DC

## 2022-11-29 NOTE — Progress Notes (Signed)
Established Patient Office Visit  Subjective   Patient ID: Stephanie Dyer, female    DOB: [birth year]  Age: 47 y.o. MRN: [id number]  Chief Complaint  Patient presents with   Exposure to STD   Vaginal Itching    Vaginal itching started couple weeks ago went away but over weekend came back and is having green discharge   HPI  Stephanie Dyer is a 47 year old female who presents today with concerns regarding possible sexually transmitted infections (STIs). She has been in a monogamous relationship for the past three years but recently discovered that her partner may have been unfaithful. She reports experiencing vaginal discharge that is green in color and malodorous, which has persisted for a few days. The discharge has raised her concern for potential infections, prompting her to seek evaluation and testing. She reports associated symptoms such as itching, burning, and abdominal cramping. Sexually Transmitted Disease Check: Patient presents for sexually transmitted disease check. Sexual history reviewed with the patient. STD exposure: current sexual partner thought to have no history of any STD.  Previous history of STD:  none. Current symptoms include vaginal discharge: copious, green, frothy, and malodorous, vaginal irritation: moderate, urethral discharge: green, thin, frothy, and malodorous, pelvic pain: moderate.  Contraception: status post hysterectomy.     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/06/2021   History of pulmonary embolus (PE) 03/17/2021   Borderline personality disorder (HCC) 03/17/2021   Obesity (BMI 30.0-34.9) 03/17/2021   Iron deficiency 08/29/2020   Vitamin  B12 deficiency 03/31/2020   History of systemic reaction to hymenoptera sting 01/22/2020   Marijuana use 12/09/2019   Generalized anxiety disorder 12/06/2019   Hot flashes 12/06/2019   Pain in female genitalia on intercourse 12/06/2019   Ankylosing spondylitis (HCC)    Thyroid nodule    Asthma    Environmental allergies    Migraines    Vitamin D deficiency    Lumbar back pain    PCOS (polycystic ovarian syndrome)    GERD (gastroesophageal reflux disease)    Past Medical History:  Diagnosis Date   Angio-edema    Ankylosing spondylitis (HCC)    Anxiety    Arthritis    Asthma    Cervical cancer (HCC) 1998   Elevated LFTs    Environmental allergies    GERD (gastroesophageal reflux disease)    History of gastric bypass    History of iron deficiency anemia    Hypoglycemia    Kidney stones    Lumbar back pain    Migraines    PCOS (polycystic ovarian syndrome)    Pseudotumor cerebri 2006   Thyroid cyst    Urticaria    Vitamin B12 deficiency 03/31/2020   Vitamin D deficiency    Past Surgical History:  Procedure Laterality Date   ADENOIDECTOMY     CESAREAN SECTION  2016   CHOLECYSTECTOMY  1998   COSMETIC SURGERY  2015   GASTRIC BYPASS  2014   KNEE SURGERY Left 2001   LITHOTRIPSY  2021   TONSILLECTOMY     TOTAL ABDOMINAL HYSTERECTOMY  10/2018   Social History   Tobacco Use   Smoking status: Former    Current packs/day: 0.00  Types: Cigarettes    Quit date: 11/27/2012    Years since quitting: 10.0   Smokeless tobacco: Never  Vaping Use   Vaping status: Every Day  Substance Use Topics   Alcohol use: Not Currently   Drug use: Not Currently    Types: Marijuana   Social History   Socioeconomic History   Marital status: Single    Spouse name: Not on file   Number of children: 2   Years of education: Not on file   Highest education level: Not on file  Occupational History   Not on file  Tobacco Use   Smoking status: Former    Current packs/day: 0.00     Types: Cigarettes    Quit date: 11/27/2012    Years since quitting: 10.0   Smokeless tobacco: Never  Vaping Use   Vaping status: Every Day  Substance and Sexual Activity   Alcohol use: Not Currently   Drug use: Not Currently    Types: Marijuana   Sexual activity: Yes    Birth control/protection: Other-see comments  Other Topics Concern   Not on file  Social History Narrative   Stephanie Dyer, Alex- 5   Social Determinants of Health   Financial Resource Strain: Low Risk  (07/23/2022)   Overall Financial Resource Strain (CARDIA)    Difficulty of Paying Living Expenses: Not hard at all  Food Insecurity: No Food Insecurity (07/23/2022)   Hunger Vital Sign    Worried About Running Out of Food in the Last Year: Never true    Ran Out of Food in the Last Year: Never true  Transportation Needs: No Transportation Needs (07/23/2022)   PRAPARE - Administrator, Civil Service (Medical): No    Lack of Transportation (Non-Medical): No  Physical Activity: Insufficiently Active (07/23/2022)   Exercise Vital Sign    Days of Exercise per Week: 2 days    Minutes of Exercise per Session: 30 min  Stress: No Stress Concern Present (07/23/2022)   [employer] of Occupational Health - Occupational Stress Questionnaire    Feeling of Stress : Not at all  Social Connections: Moderately Integrated (07/23/2022)   Social Connection and Isolation Panel [NHANES]    Frequency of Communication with Friends and Family: More than three times a week    Frequency of Social Gatherings with Friends and Family: More than three times a week    Attends Religious Services: More than 4 times per year    Active Member of [employer] or Organizations: Yes    Attends Banker Meetings: More than 4 times per year    Marital Status: Divorced  Intimate Partner Violence: Not At Risk (07/23/2022)   Humiliation, Afraid, Rape, and Kick questionnaire    Fear of Current or Ex-Partner: No    Emotionally Abused: No     Physically Abused: No    Sexually Abused: No   Family Status  Relation Name Status   Mother  Alive   Father  Deceased   Brother  Alive   MGM  Alive   MGF  Deceased at age 32   PGM  Deceased   PGF  Deceased   Daughter  Alive   Son  Alive   Neg Hx  (Not Specified)  No partnership data on file   Family History  Problem Relation Age of Onset   Alcohol abuse Mother    Diabetes Mother    COPD Mother    Emphysema Mother    Asthma Mother  Hypertension Father    Heart disease Father    Heart attack Father    CVA Brother    Diabetes Brother    Hypertension Brother    Diabetes Maternal Grandmother    Parkinson's disease Maternal Grandmother    Macular degeneration Maternal Grandmother    Thyroid cancer Maternal Grandmother    Stomach cancer Maternal Grandmother    Cervical cancer Maternal Grandmother    Migraines Maternal Grandmother    Suicidality Maternal Grandfather    Autism Son    Hearing loss Son    Allergic rhinitis Neg Hx    Eczema Neg Hx    Urticaria Neg Hx    Breast cancer Neg Hx    Allergies  Allergen Reactions   Codeine Anaphylaxis and Other (See Comments)    Other reaction(s): Projectile vomiting, throat closes Headache Other reaction(s): Other (See Comments) Headache   Morphine Anaphylaxis    Other reaction(s): Projectile vomiting, throat clos Other reaction(s): Projectile vomiting, throat clos Other reaction(s): Projectile vomiting, throat closes Other reaction(s): Projectile vomiting, throat closes Other reaction(s): Projectile vomiting, throat clos Other reaction(s): Projectile vomiting, throat closes Headache Other reaction(s): Projectile vomiting, throat clos Other reaction(s): Projectile vomiting, throat clos   Other Anaphylaxis    peanuts Other reaction(s): Other (See Comments) Headache 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   Banana    Fioricet [Butalbital-Apap-Caffeine] Other (See Comments)    Headache    Nsaids Other (See Comments) and Nausea And Vomiting    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   Vancomycin Itching and Other (See Comments)    Itching & flushed skin.   Venlafaxine Other (See Comments)    GI upset   Watermelon Flavor    Zolpidem Other (See Comments)    Ambien-Parasomnia   Latex Rash   Peanut-Containing Drug Products Rash   Shellfish Allergy Swelling and Rash      Review of Systems  Constitutional:  Negative for chills and fever.  HENT:  Negative for congestion.   Eyes:  Negative for pain and discharge.  Respiratory:  Negative for cough and shortness of breath.   Cardiovascular:  Negative for chest pain and leg swelling.  Gastrointestinal:  Negative for blood in stool, constipation and nausea.  Genitourinary:  Negative for frequency.       Green malodorous vaginal discharge  Skin:  Negative for itching and rash.  Neurological:  Negative for dizziness and weakness.  Endo/Heme/Allergies:  Negative for environmental allergies and polydipsia. Does not bruise/bleed easily.  Psychiatric/Behavioral:  Negative for suicidal ideas. The patient is not nervous/anxious.    Negative unless indicated in HPI   Objective:     BP 97/66   Pulse 78   Temp (!) 97.5 F (36.4 C) (Temporal)   Ht 5\' 3"  (1.6 m)   Wt 168 lb (76.2 kg)   LMP 11/04/2018   SpO2 97%   BMI 29.76 kg/m  BP Readings from Last 3 Encounters:  11/29/22 97/66  10/27/22 108/70  10/26/22 (!) 94/59   Wt Readings from Last 3 Encounters:  11/29/22 168 lb (76.2 kg)  10/27/22 174 lb (78.9 kg)  10/26/22 173 lb 9.6 oz (78.7 kg)      Physical Exam Vitals and nursing note reviewed.  HENT:     Head: Normocephalic and atraumatic.  Eyes:     Extraocular Movements: Extraocular movements intact.     Conjunctiva/sclera: Conjunctivae normal.     Pupils: Pupils are equal, round, and reactive  to light.  Cardiovascular:     Rate and Rhythm: Normal rate and regular rhythm.  Pulmonary:      Effort: Pulmonary effort is normal.     Breath sounds: Normal breath sounds.  Abdominal:     General: Bowel sounds are normal.     Palpations: Abdomen is soft.     Tenderness: There is no abdominal tenderness. There is no right CVA tenderness or left CVA tenderness.  Musculoskeletal:        General: Normal range of motion.  Skin:    General: Skin is warm and dry.     Coloration: Skin is not jaundiced.     Findings: Rash present.  Neurological:     Mental Status: She is alert and oriented to person, place, and time. Mental status is at baseline.  Psychiatric:        Mood and Affect: Mood normal.        Behavior: Behavior normal.        Thought Content: Thought content normal.        Judgment: Judgment normal.   WET PREP FOR TRICH, YEAST, CLUE  Positive clues cells, negative trichomonas,WBC >25, Bacteria many an epithelial cell moderate  Results for orders placed or performed in visit on 11/29/22  WET PREP FOR TRICH, YEAST, CLUE   Specimen: Vaginal Swab   Vaginal Swab  Result Value Ref Range   Trichomonas Exam Negative Negative   Yeast Exam Negative Negative   Clue Cell Exam Positive (A) Negative    Last CBC Lab Results  Component Value Date   WBC 5.3 09/08/2022   HGB 14.7 09/08/2022   HCT 44.9 09/08/2022   MCV 90 09/08/2022   MCH 29.5 09/08/2022   RDW 13.4 09/08/2022   PLT 204 09/08/2022   Last metabolic panel Lab Results  Component Value Date   GLUCOSE Dyer 09/08/2022   NA 145 (H) 09/08/2022   K 4.0 09/08/2022   CL 109 (H) 09/08/2022   CO2 22 09/08/2022   BUN 11 09/08/2022   CREATININE 0.66 09/08/2022   EGFR 110 09/08/2022   CALCIUM 9.1 09/08/2022   PROT 6.4 09/08/2022   ALBUMIN 4.0 09/08/2022   LABGLOB 2.4 09/08/2022   AGRATIO 2.0 11/03/2021   BILITOT 0.6 09/08/2022   ALKPHOS 110 09/08/2022   AST 17 09/08/2022   ALT 20 09/08/2022   ANIONGAP 7 03/17/2021   Last lipids Lab Results  Component Value Date   CHOL 146 09/08/2022   HDL 65 09/08/2022    LDLCALC 69 09/08/2022   TRIG 58 09/08/2022   CHOLHDL 2.2 09/08/2022   Last hemoglobin A1c Lab Results  Component Value Date   HGBA1C 5.0 05/20/2022   Last thyroid functions Lab Results  Component Value Date   TSH 1.980 09/08/2022   T4TOTAL 6.1 11/28/2019        Assessment & Plan:  Vaginal itching -     WET PREP FOR TRICH, YEAST, CLUE -     Ct Ng M genitalium NAA, Urine -     metroNIDAZOLE; Take 1 tablet (500 mg total) by mouth 2 (two) times daily.  Dispense: 14 tablet; Refill: 0  Bacterial vaginosis -     metroNIDAZOLE; Take 1 tablet (500 mg total) by mouth 2 (two) times daily.  Dispense: 14 tablet; Refill: 0  Vaginal discharge -     WET PREP FOR TRICH, YEAST, CLUE -     Ct Ng M genitalium NAA, Urine -     metroNIDAZOLE; Take 1  tablet (500 mg total) by mouth 2 (two) times daily.  Dispense: 14 tablet; Refill: 0   Moreen is a 47 yrs old female, no acute distress BV: Flagyl 500 BID for 7-day # 14, take all until done Take with yogurt to prevent yeast infection - Ct Ng M genitalium NAA, Urine - No intercourse while taking the medications Avoid ETOH while on this medications - Avoid using douches or products meant for yeast infections, which could make BV worse.   The above assessment and management plan was discussed with the patient. The patient verbalized understanding of and has agreed to the management plan. Patient is aware to call the clinic if they develop any new symptoms or if symptoms persist or worsen. Patient is aware when to return to the clinic for a follow-up visit. Patient educated on when it is appropriate to go to the emergency department.   Return if symptoms worsen or fail to improve, for follow-up.    Arrie Aran Santa Lighter, DNP [facility] [address] [place], [place] [address] [phone]

## 2022-11-30 ENCOUNTER — Ambulatory Visit: Payer: Medicare Other | Admitting: Family Medicine

## 2022-12-01 LAB — CT NG M GENITALIUM NAA, URINE
Chlamydia trachomatis, NAA: NEGATIVE
Mycoplasma genitalium NAA: NEGATIVE
Neisseria gonorrhoeae, NAA: NEGATIVE

## 2022-12-16 ENCOUNTER — Other Ambulatory Visit: Payer: Self-pay | Admitting: Allergy & Immunology

## 2022-12-21 ENCOUNTER — Ambulatory Visit: Payer: Medicare Other | Admitting: Family Medicine

## 2022-12-22 ENCOUNTER — Ambulatory Visit: Payer: Medicare Other | Admitting: Family Medicine

## 2022-12-22 NOTE — Progress Notes (Deleted)
   [address] [place] [address] Dept: [phone]  FOLLOW UP NOTE  Patient ID: Stephanie Dyer, female    DOB: 23-May-1975  Age: 47 y.o. MRN: [id number] Date of Office Visit: 12/22/2022  Assessment  Chief Complaint: No chief complaint on file.  HPI Stephanie Dyer is a 47 year old female who presents to the clinic for follow-up visit.  She was last seen in this clinic on 10/06/2022 by Dr. Dellis Anes for evaluation of asthma, allergic rhinitis, reflux, food allergy to peanuts and tree nuts, and possible insect sting allergy.  Her last environmental allergy testing via lab on 10/08/2022 was positive to pollens, cat, and outdoor mold. Her last food allergy testing via lab was slightly positive to peanut and almond.  In office challenge was recommended.  Testing to watermelon and cantaloupe on 10/08/2022 was negative.   Stinging insect panel was negative on 10/08/2022 and skin testing was recommended for further evaluation.   Drug Allergies:  Allergies  Allergen Reactions  . Codeine Anaphylaxis and Other (See Comments)    Other reaction(s): Projectile vomiting, throat closes Headache Other reaction(s): Other (See Comments) Headache  . Morphine Anaphylaxis    Other reaction(s): Projectile vomiting, throat clos Other reaction(s): Projectile vomiting, throat clos Other reaction(s): Projectile vomiting, throat closes Other reaction(s): Projectile vomiting, throat closes Other reaction(s): Projectile vomiting, throat clos Other reaction(s): Projectile vomiting, throat closes Headache Other reaction(s): Projectile vomiting, throat clos Other reaction(s): Projectile vomiting, throat clos  . Other Anaphylaxis    peanuts Other reaction(s): Other (See Comments) Headache 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  . Banana   . Fioricet [Butalbital-Apap-Caffeine] Other (See Comments)    Headache  . Nsaids Other (See Comments) and Nausea  And Vomiting    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  . Vancomycin Itching and Other (See Comments)    Itching & flushed skin.  . Venlafaxine Other (See Comments)    GI upset  . Watermelon Flavor   . Zolpidem Other (See Comments)    Ambien-Parasomnia  . Latex Rash  . Peanut-Containing Drug Products Rash  . Shellfish Allergy Swelling and Rash    Physical Exam: LMP 11/04/2018    Physical Exam  Diagnostics:    Assessment and Plan: No diagnosis found.  No orders of the defined types were placed in this encounter.   There are no Patient Instructions on file for this visit.  No follow-ups on file.    Thank you for the opportunity to care for this patient.  Please do not hesitate to contact me with questions.  Thermon Leyland, FNP [facility]

## 2022-12-23 ENCOUNTER — Encounter: Payer: Self-pay | Admitting: Family Medicine

## 2022-12-23 ENCOUNTER — Other Ambulatory Visit: Payer: Self-pay

## 2022-12-23 DIAGNOSIS — E162 Hypoglycemia, unspecified: Secondary | ICD-10-CM

## 2022-12-23 MED ORDER — FREESTYLE LIBRE 3 PLUS SENSOR MISC: 2 refills | Status: AC

## 2022-12-31 ENCOUNTER — Other Ambulatory Visit: Payer: Self-pay | Admitting: Family Medicine

## 2022-12-31 DIAGNOSIS — Z9109 Other allergy status, other than to drugs and biological substances: Secondary | ICD-10-CM

## 2023-01-05 ENCOUNTER — Encounter: Payer: Self-pay | Admitting: Physician Assistant

## 2023-01-05 NOTE — Progress Notes (Signed)
Communication received from Dr. Emmit Pomfret [facility]) requesting instructions regarding Xarelto prior to upcoming surgery (right shoulder scope SCD, open biceps tenodesis and RCR).  Patient Stephanie Dyer is on chronic anticoagulation (Xarelto 20 mg daily) due to her history of recurrent pulmonary embolisms.   From a hematology standpoint, she may HOLD Xarelto x 48 hours prior to her upcoming procedure. She should resume Xarelto within the 24 hours following her procedure.   Although this does slightly increase her risk of VTE within this period, the benefits of holding anticoagulation prior to procedure outweigh the risks.   Carnella Guadalajara, PA-C 01/05/23 3:39 PM
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
## 2023-01-06 ENCOUNTER — Encounter: Payer: Self-pay | Admitting: Allergy

## 2023-01-06 ENCOUNTER — Ambulatory Visit (INDEPENDENT_AMBULATORY_CARE_PROVIDER_SITE_OTHER): Payer: Medicare Other | Admitting: Allergy

## 2023-01-06 ENCOUNTER — Encounter (HOSPITAL_COMMUNITY): Payer: Self-pay | Admitting: Hematology

## 2023-01-06 ENCOUNTER — Telehealth: Payer: Self-pay

## 2023-01-06 VITALS — BP 90/64 | HR 72 | Temp 98.1°F | Resp 18 | Wt 169.5 lb

## 2023-01-06 DIAGNOSIS — J4520 Mild intermittent asthma, uncomplicated: Secondary | ICD-10-CM | POA: Diagnosis not present

## 2023-01-06 DIAGNOSIS — T7800XD Anaphylactic reaction due to unspecified food, subsequent encounter: Secondary | ICD-10-CM | POA: Diagnosis not present

## 2023-01-06 DIAGNOSIS — J3081 Allergic rhinitis due to animal (cat) (dog) hair and dander: Secondary | ICD-10-CM

## 2023-01-06 DIAGNOSIS — Z91038 Other insect allergy status: Secondary | ICD-10-CM

## 2023-01-06 DIAGNOSIS — J3089 Other allergic rhinitis: Secondary | ICD-10-CM

## 2023-01-06 DIAGNOSIS — J301 Allergic rhinitis due to pollen: Secondary | ICD-10-CM | POA: Diagnosis not present

## 2023-01-06 DIAGNOSIS — T781XXD Other adverse food reactions, not elsewhere classified, subsequent encounter: Secondary | ICD-10-CM

## 2023-01-06 DIAGNOSIS — K219 Gastro-esophageal reflux disease without esophagitis: Secondary | ICD-10-CM

## 2023-01-06 NOTE — Telephone Encounter (Signed)
Patient came in and signed consent form for allergy injections. Patient did not want a copy of forms. Per Dr.Kim to mail out a copy to patient. Forms have been placed to be mailed out to the address on file.

## 2023-01-06 NOTE — Patient Instructions (Addendum)
Food Continue to avoid foods that bother you - peanuts, tree nuts, trigger fruits. No peanut butter challenge. For mild symptoms you can take over the counter antihistamines such as Benadryl 1-2 tablets = 25-50mg  and monitor symptoms closely. If symptoms worsen or if you have severe symptoms including breathing issues, throat closure, significant swelling, whole body hives, severe diarrhea and vomiting, lightheadedness then inject epinephrine and seek immediate medical care afterwards.  Oral allergy syndrome. Discussed that her food triggered oral and throat symptoms are likely caused by oral food allergy syndrome (OFAS). This is caused by cross reactivity of pollen with fresh fruits and vegetables, and nuts. Symptoms are usually localized in the form of itching and burning in mouth and throat. Very rarely it can progress to more severe symptoms. Eating foods in cooked or processed forms usually minimizes symptoms. I recommended avoidance of eating the problem foods, especially during the peak season(s). Sometimes, OFAS can induce severe throat swelling or even a systemic reaction; with such instance, I advised them to report to a local ER. A list of common pollens and food cross-reactivities was provided to the patient.   Environmental allergies Start environmental control measures as below. Continue Singulair (montelukast) 10mg  daily at night. Use Flonase (fluticasone) nasal spray 1-2 sprays per nostril once a day as needed for nasal congestion.  Nasal saline spray (i.e., Simply Saline) or nasal saline lavage (i.e., NeilMed) is recommended as needed and prior to medicated nasal sprays.  Use over the counter antihistamines such as Zyrtec (cetirizine), Claritin (loratadine), Allegra (fexofenadine), or Xyzal (levocetirizine) daily as needed. May take twice a day during allergy flares. May switch antihistamines every few months. Consider allergy injections for long term control if above medications do  not help the symptoms - handout given.  1 shot let us know when ready to start.   Asthma May use albuterol rescue inhaler 2 puffs or nebulizer every 4 to 6 hours as needed for shortness of breath, chest tightness, coughing, and wheezing. May use albuterol rescue inhaler 2 puffs 5 to 15 minutes prior to strenuous physical activities. Monitor frequency of use - if you need to use it more than twice per week on a consistent basis let us know.   GERD Continue pantoprazole 40mg  once day - nothing to eat or drink for 20-30 minutes afterwards.   Hymenoptera allergy 2024 bloodwork negative hymenoptera panel.  Continue to avoid.  Return in about 4 months (around 05/06/2023). Or sooner if needed.   Control of House Dust Mite Allergen Dust mite allergens are a common trigger of allergy and asthma symptoms. While they can be found throughout the house, these microscopic creatures thrive in warm, humid environments such as bedding, upholstered furniture and carpeting. Because so much time is spent in the bedroom, it is essential to reduce mite levels there.  Encase pillows, mattresses, and box springs in special allergen-proof fabric covers or airtight, zippered plastic covers.  Bedding should be washed weekly in hot water (130 F) and dried in a hot dryer. Allergen-proof covers are available for comforters and pillows that can't be regularly washed.  Wash the allergy-proof covers every few months. Minimize clutter in the bedroom. Keep pets out of the bedroom.  Keep humidity less than 50% by using a dehumidifier or air conditioning. You can buy a humidity measuring device called a hygrometer to monitor this.  If possible, replace carpets with hardwood, linoleum, or washable area rugs. If that's not possible, vacuum frequently with a vacuum that has a HEPA  filter. Remove all upholstered furniture and non-washable window drapes from the bedroom. Remove all non-washable stuffed toys from the bedroom.  Wash  stuffed toys weekly.  Reducing Pollen Exposure Pollen seasons: trees (spring), grass (summer) and ragweed/weeds (fall). Keep windows closed in your home and car to lower pollen exposure.  Install air conditioning in the bedroom and throughout the house if possible.  Avoid going out in dry windy days - especially early morning. Pollen counts are highest between 5 - 10 AM and on dry, hot and windy days.  Save outside activities for late afternoon or after a heavy rain, when pollen levels are lower.  Avoid mowing of grass if you have grass pollen allergy. Be aware that pollen can also be transported indoors on people and pets.  Dry your clothes in an automatic dryer rather than hanging them outside where they might collect pollen.  Rinse hair and eyes before bedtime.

## 2023-01-06 NOTE — Progress Notes (Unsigned)
Aeroallergen Immunotherapy   Ordering Provider: Dr. Wyline Mood   Patient Details  Name: Stephanie Dyer  MRN: [id number]  Date of Birth: 11-Jan-1976   Order 1 of 1   Vial Label: [id number]   0.3 ml (Volume)  BAU Concentration -- 7 Grass Mix* 100,000 ([address] [place], [place], [place], [place], RedTop, Sweet Vernal, Timothy)  0.3 ml (Volume)  BAU Concentration -- [place] 10,000  0.2 ml (Volume)  1:20 Concentration -- [place]  0.3 ml (Volume)  1:20 Concentration -- Ragweed Mix  0.5 ml (Volume)  1:20 Concentration -- Weed Mix*  0.5 ml (Volume)  1:20 Concentration -- Eastern 10 Tree Mix (also Sweet Gum)  0.2 ml (Volume)  1:20 Concentration -- Box Elder  0.2 ml (Volume)  1:10 Concentration -- Cedar, red  0.2 ml (Volume)  1:10 Concentration -- Pecan Pollen  0.2 ml (Volume)  1:20 Concentration -- Red Mulberry  0.5 ml (Volume)  1:10 Concentration -- Cat Hair    3.1  ml Extract Subtotal  1.9  ml Diluent  5.0  ml Maintenance Total   Schedule:  B  Silver Vial (1:1,000,000): Schedule B (6 doses)  Blue Vial (1:100,000): Schedule B (6 doses)  Yellow Vial (1:10,000): Schedule B (6 doses)  Green Vial (1:1,000): Schedule B (6 doses)  Red Vial (1:100): Schedule A (14 doses)   Special Instructions: 1-2 times during build up.

## 2023-01-17 ENCOUNTER — Encounter (HOSPITAL_COMMUNITY): Payer: Self-pay | Admitting: Hematology

## 2023-01-18 ENCOUNTER — Other Ambulatory Visit: Payer: Self-pay | Admitting: Family Medicine

## 2023-01-22 ENCOUNTER — Other Ambulatory Visit: Payer: Self-pay | Admitting: Allergy & Immunology

## 2023-01-24 ENCOUNTER — Inpatient Hospital Stay: Payer: Medicare Other | Attending: Physician Assistant

## 2023-01-24 DIAGNOSIS — D509 Iron deficiency anemia, unspecified: Secondary | ICD-10-CM

## 2023-01-24 DIAGNOSIS — E538 Deficiency of other specified B group vitamins: Secondary | ICD-10-CM

## 2023-01-24 DIAGNOSIS — E611 Iron deficiency: Secondary | ICD-10-CM | POA: Diagnosis not present

## 2023-01-24 DIAGNOSIS — I2699 Other pulmonary embolism without acute cor pulmonale: Secondary | ICD-10-CM

## 2023-01-24 LAB — CBC WITH DIFFERENTIAL/PLATELET
Abs Immature Granulocytes: 0.01 10*3/uL (ref 0.00–0.07)
Basophils Absolute: 0 10*3/uL (ref 0.0–0.1)
Basophils Relative: 1 %
Eosinophils Absolute: 0.2 10*3/uL (ref 0.0–0.5)
Eosinophils Relative: 3 %
HCT: 40.7 % (ref 36.0–46.0)
Hemoglobin: 13.1 g/dL (ref 12.0–15.0)
Immature Granulocytes: 0 %
Lymphocytes Relative: 41 %
Lymphs Abs: 2.4 10*3/uL (ref 0.7–4.0)
MCH: 28.7 pg (ref 26.0–34.0)
MCHC: 32.2 g/dL (ref 30.0–36.0)
MCV: 89.1 fL (ref 80.0–100.0)
Monocytes Absolute: 0.5 10*3/uL (ref 0.1–1.0)
Monocytes Relative: 8 %
Neutro Abs: 2.8 10*3/uL (ref 1.7–7.7)
Neutrophils Relative %: 47 %
Platelets: 217 10*3/uL (ref 150–400)
RBC: 4.57 MIL/uL (ref 3.87–5.11)
RDW: 14.2 % (ref 11.5–15.5)
WBC: 5.9 10*3/uL (ref 4.0–10.5)
nRBC: 0 % (ref 0.0–0.2)

## 2023-01-24 LAB — IRON AND TIBC
Iron: 92 ug/dL (ref 28–170)
Saturation Ratios: 27 % (ref 10.4–31.8)
TIBC: 347 ug/dL (ref 250–450)
UIBC: 255 ug/dL

## 2023-01-24 LAB — FERRITIN: Ferritin: 81 ng/mL (ref 11–307)

## 2023-01-25 DIAGNOSIS — M75121 Complete rotator cuff tear or rupture of right shoulder, not specified as traumatic: Secondary | ICD-10-CM | POA: Diagnosis not present

## 2023-01-26 ENCOUNTER — Telehealth: Payer: Self-pay | Admitting: Allergy & Immunology

## 2023-01-26 NOTE — Telephone Encounter (Signed)
Patient's pharmacy called stating they needed a prescription for Triamcinolone sent to [facility].

## 2023-01-26 NOTE — Telephone Encounter (Signed)
Triamcinolone 0.1% ointment prescription was sent to [facility] on 01/24/23.

## 2023-01-27 LAB — METHYLMALONIC ACID, SERUM: Methylmalonic Acid, Quantitative: 434 nmol/L — ABNORMAL HIGH (ref 0–378)

## 2023-01-30 NOTE — Progress Notes (Unsigned)
VIRTUAL VISIT via TELEPHONE NOTE [facility]   I connected with Stephanie Dyer  on 01/31/23 at  1:08 PM by telephone and verified that I am speaking with the correct person using two identifiers.  Location: Patient: Home Provider: [facility]   I discussed the limitations, risks, security and privacy concerns of performing an evaluation and management service by telephone and the availability of in person appointments. I also discussed with the patient that there may be a patient responsible charge related to this service. The patient expressed understanding and agreed to proceed.  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 47 y.o. female returns for routine follow-up of  iron deficiency and her history of DVT/PE x2 (on chronic anticoagulation).  She was last seen by Rojelio Brenner PA-C on 09/08/2022.    At visit in July 2024, we had in-depth discussion regarding long-term anticoagulation.  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  However, patient is employed as a IT sales professional, and was told that she cannot continue in her capacity as a IT sales professional due to being on chronic anticoagulant.  Patient was advised to remain on anticoagulant nonetheless.  In the interim, she made the informed decision to stop taking Xarelto AGAINST MEDICAL ADVICE, and verbalizes understanding of risks associated with this choice.  She has been taking low-dose aspirin instead.  At today's visit, she reports feeling fair.  No symptoms of DVT such as unilateral leg swelling, pain, and erythema.  No symptoms of PE such as new shortness of breath, chest pain, cough, hemoptysis, or palpitations.  She denies any major bleeding events, epistaxis, melena, or  hematochezia.   She denies any fatigue, pica, lightheadedness, syncope, or abnormal headaches.     She reports 30% energy and 75% appetite.  She reports that she is maintaining a stable weight at this time. She is planning for upcoming rotator cuff surgery on Monday, 02/07/2023.   REVIEW OF SYSTEMS:   Review of Systems  Constitutional:  Positive for malaise/fatigue. Negative for chills, diaphoresis, fever and weight loss.  Respiratory:  Negative for cough and shortness of breath.   Cardiovascular:  Negative for chest pain and palpitations.  Gastrointestinal:  Negative for abdominal pain, blood in stool, melena, nausea and vomiting.  Musculoskeletal:  Positive for joint pain.  Neurological:  Negative for dizziness and headaches.     PHYSICAL EXAM: (per limitations of virtual telephone visit)  The patient is alert and oriented x 3, exhibiting adequate mentation, good mood, and ability to speak in full sentences and execute sound judgement.  ASSESSMENT & PLAN:  1.  Recurrent PE, on chronic anticoagulation - Initial pulmonary embolism in 2008, provoked in the setting of hospitalization for bronchitis/pneumonia - Second pulmonary embolism in June 2022 was unprovoked.  Diagnosed in ED at [facility] after patient presented with complaints of left-sided chest pain and LLE pain - CTA chest (08/16/2020) showed age-indeterminate subsegmental PE but no evidence of acute PE.  - Venous duplex ultrasound of bilateral lower extremities (08/29/2020): Negative for DVT - Patient reports a family history of blood clots, but no family members with definitive diagnosis of hypercoagulable disorder - Hypercoagulable work-up (08/29/2020) was unremarkable  - She is not on any oral birth control - At visit in July 2024, we had  in-depth discussion regarding long-term anticoagulation.  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  However, patient is  employed as a IT sales professional, and was told that she cannot continue in her capacity as a IT sales professional due to being on chronic anticoagulant.  Patient was advised to remain on anticoagulant nonetheless.  In the interim, she made the informed decision to stop taking Xarelto AGAINST MEDICAL ADVICE, and verbalizes understanding of risks associated with this choice.  She has been taking low-dose aspirin instead. - No signs or symptoms of recurrent DVT/PE - PLAN: Patient self-discontinued her Xarelto AGAINST MEDICAL ADVICE (see above).  Since she will be out of work for 5 to 6 months following rotator cuff surgery, she does plan to take it in the postoperative period.     2.   Iron deficiency state, without anemia - Patient had history of gastric bypass in 2011, has had issues with iron level since that time and has required intermittent IV iron infusions - Iron levels failed to improve with oral supplementation.  Suspect malabsorption secondary to gastric bypass surgery. - Most recent IV iron with Feraheme x2 on 04/06/2021 and 04/13/2021 - Most recent labs (01/24/2023): Hgb 13.1/MCV 89.1, ferritin 81, iron saturation 27%. - She has worsening fatigue, but denies any signs or symptoms of major blood loss - PLAN: Recommend IV Feraheme x 1 due to symptomatic iron deficiency. -  Will check labs with phone visit in 6 months.   3.  Other deficiencies -Vitamin B12 failed to improve on oral supplementation.  She has been taking monthly B12 injections since November 2023.  Increased to B12 injections every 3 weeks as of May 2024 - She is taking vitamin D 5,000 units daily - Labs from May 2024 continue to show marginal B12 (215) with elevated MMA (483) - Most recent labs (01/24/2023): Vitamin B12 and vitamin D were ordered, but not checked.  MMA remains elevated at 434. - PLAN: We will INCREASE the frequency of her vitamin B12 injections to be given once every 2 weeks - Continue vitamin D supplements.  - We will  recheck B12/MMA and vitamin D in 6 months   PLAN SUMMARY: >> Prescription sent to pharmacy for patient to self administer B12 injections every 2 weeks >> Feraheme x 1 >> Labs in 6 months (CBC/D, ferritin, iron/TIBC, B12, MMA, vitamin D) >> OFFICE visit 1 week after labs    I discussed the assessment and treatment plan with the patient. The patient was provided an opportunity to ask questions and all were answered. The patient agreed with the plan and demonstrated an understanding of the instructions.   The patient was advised to call back or seek an in-person evaluation if the symptoms worsen or if the condition fails to improve as anticipated.  I provided 24 minutes of non-face-to-face time during this encounter.  Carnella Guadalajara, PA-C 01/31/23 5:13 PM

## 2023-01-31 ENCOUNTER — Inpatient Hospital Stay (HOSPITAL_BASED_OUTPATIENT_CLINIC_OR_DEPARTMENT_OTHER): Payer: Medicare Other | Admitting: Physician Assistant

## 2023-01-31 ENCOUNTER — Encounter: Payer: Self-pay | Admitting: Physician Assistant

## 2023-01-31 DIAGNOSIS — E538 Deficiency of other specified B group vitamins: Secondary | ICD-10-CM

## 2023-01-31 DIAGNOSIS — Z5320 Procedure and treatment not carried out because of patient's decision for unspecified reasons: Secondary | ICD-10-CM

## 2023-01-31 DIAGNOSIS — D509 Iron deficiency anemia, unspecified: Secondary | ICD-10-CM

## 2023-01-31 DIAGNOSIS — I2699 Other pulmonary embolism without acute cor pulmonale: Secondary | ICD-10-CM

## 2023-01-31 MED ORDER — BD LUER-LOK SYRINGE 25G X 1" 3 ML MISC: 0 refills | Status: DC

## 2023-01-31 MED ORDER — CYANOCOBALAMIN 1000 MCG/ML IJ SOLN
1000.0000 ug | INTRAMUSCULAR | 3 refills | Status: DC
Start: 2023-01-31 — End: 2023-06-17

## 2023-02-01 ENCOUNTER — Telehealth: Payer: Self-pay | Admitting: Family Medicine

## 2023-02-01 ENCOUNTER — Telehealth: Payer: Self-pay | Admitting: Allergy & Immunology

## 2023-02-01 NOTE — Telephone Encounter (Signed)
I don't see where we prescribe levothyroxine. Did we know she was on this?

## 2023-02-01 NOTE — Telephone Encounter (Signed)
Pharmacy request refills for triamcinalone.

## 2023-02-01 NOTE — Telephone Encounter (Signed)
Refer to 01/24/23 telephone encounter.

## 2023-02-01 NOTE — Telephone Encounter (Unsigned)
Copied from CRM [id number]. Topic: Clinical - Medication Refill >> Feb 01, 2023 11:14 AM Thomes Dinning wrote: Most Recent Primary Care Visit:  Provider: [facility], [place]  Department: [facility]  Visit Type: OFFICE VISIT  Date: 11/29/2022  Medication: levothyroxine (SYNTHROID) 88 MCG tablet fluticasone (FLONASE) 50 MCG/ACT nasal spray  Has the patient contacted their pharmacy? No (Agent: If no, request that the patient contact the pharmacy for the refill. If patient does not wish to contact the pharmacy document the reason why and proceed with request.) (Agent: If yes, when and what did the pharmacy advise?)  Is this the correct pharmacy for this prescription? Yes If no, delete pharmacy and type the correct one.  This is the patient's preferred pharmacy:  [facility] [address] [place] - [address] [place] [address] [place] [address] Phone: [phone] Fax: [phone]  [facility] - [place], [place] - [address] [place] [address] Phone: [phone] Fax: [phone]   Has the prescription been filled recently? No  Is the patient out of the medication? No  Has the patient been seen for an appointment in the last year OR does the patient have an upcoming appointment? Yes  Can we respond through MyChart? Yes  Agent: Please be advised that Rx refills may take up to 3 business days. We ask that you follow-up with your pharmacy.

## 2023-02-02 ENCOUNTER — Inpatient Hospital Stay: Payer: Medicare Other

## 2023-02-02 ENCOUNTER — Telehealth: Payer: Self-pay | Admitting: Family Medicine

## 2023-02-02 VITALS — BP 110/67 | HR 77 | Temp 97.6°F | Resp 18

## 2023-02-02 DIAGNOSIS — E611 Iron deficiency: Secondary | ICD-10-CM | POA: Diagnosis not present

## 2023-02-02 MED ORDER — SODIUM CHLORIDE 0.9 % IV SOLN
510.0000 mg | Freq: Once | INTRAVENOUS | Status: AC
  Administered 2023-02-02: 510 mg via INTRAVENOUS
  Filled 2023-02-02: qty 510

## 2023-02-02 MED ORDER — CETIRIZINE HCL 10 MG PO TABS
10.0000 mg | ORAL_TABLET | Freq: Once | ORAL | Status: AC
Start: 2023-02-02 — End: 2023-02-02
  Administered 2023-02-02: 10 mg via ORAL
  Filled 2023-02-02: qty 1

## 2023-02-02 MED ORDER — SODIUM CHLORIDE 0.9% FLUSH: 10.0000 mL | Freq: Two times a day (BID) | INTRAVENOUS | Status: DC

## 2023-02-02 MED ORDER — FAMOTIDINE 20 MG PO TABS
20.0000 mg | ORAL_TABLET | Freq: Once | ORAL | Status: AC
  Administered 2023-02-02: 20 mg via ORAL
  Filled 2023-02-02: qty 1

## 2023-02-02 MED ORDER — ACETAMINOPHEN 325 MG PO TABS
650.0000 mg | ORAL_TABLET | Freq: Once | ORAL | Status: AC
Start: 2023-02-02 — End: 2023-02-02
  Administered 2023-02-02: 650 mg via ORAL
  Filled 2023-02-02: qty 2

## 2023-02-02 MED ORDER — SODIUM CHLORIDE 0.9 % IV SOLN: INTRAVENOUS | Status: DC

## 2023-02-02 NOTE — Telephone Encounter (Signed)
We have't ever prescribed levothyroxine for her. I don't see any documentation for levothyroxine in her chart at all. Please ask the patient who has been prescribing this for her.    Ok to refill flonase.

## 2023-02-02 NOTE — Progress Notes (Signed)
Patient refused to stay the 30 minute post iron infusion time.  Reviewed side effects and when to report to the ER with understanding verbalized.   Patient tolerated iron infusion with no complaints voiced.  Peripheral IV site clean and dry with good blood return noted before and after infusion.  Band aid applied.  VSS with discharge and left in satisfactory condition with no s/s of distress noted.

## 2023-02-02 NOTE — Telephone Encounter (Signed)
Lmtcb.

## 2023-02-02 NOTE — Telephone Encounter (Signed)
Pulling call  Copied from CRM [id number]. Topic: Clinical - Prescription Issue >> Feb 02, 2023  8:49 AM Stephanie Dyer wrote: Reason for CRM: Pt returned call from clinic, confirmed she does not take levothyroxine (SYNTHROID) and also does not need a refill on fluticasone (FLONASE).

## 2023-02-07 DIAGNOSIS — S46011A Strain of muscle(s) and tendon(s) of the rotator cuff of right shoulder, initial encounter: Secondary | ICD-10-CM | POA: Diagnosis not present

## 2023-02-07 DIAGNOSIS — G8918 Other acute postprocedural pain: Secondary | ICD-10-CM | POA: Diagnosis not present

## 2023-02-11 DIAGNOSIS — M6281 Muscle weakness (generalized): Secondary | ICD-10-CM | POA: Diagnosis not present

## 2023-02-11 DIAGNOSIS — M25611 Stiffness of right shoulder, not elsewhere classified: Secondary | ICD-10-CM | POA: Diagnosis not present

## 2023-02-11 DIAGNOSIS — M25511 Pain in right shoulder: Secondary | ICD-10-CM | POA: Diagnosis not present

## 2023-02-13 DIAGNOSIS — M6281 Muscle weakness (generalized): Secondary | ICD-10-CM | POA: Insufficient documentation

## 2023-02-13 DIAGNOSIS — M25611 Stiffness of right shoulder, not elsewhere classified: Secondary | ICD-10-CM | POA: Insufficient documentation

## 2023-02-14 ENCOUNTER — Other Ambulatory Visit: Payer: Self-pay | Admitting: *Deleted

## 2023-02-14 ENCOUNTER — Telehealth: Payer: Self-pay | Admitting: "Endocrinology

## 2023-02-14 DIAGNOSIS — E6609 Other obesity due to excess calories: Secondary | ICD-10-CM

## 2023-02-14 DIAGNOSIS — E162 Hypoglycemia, unspecified: Secondary | ICD-10-CM

## 2023-02-14 NOTE — Telephone Encounter (Signed)
 Pt needs labs updated and mailed.

## 2023-02-14 NOTE — Telephone Encounter (Signed)
Labs have been updated and mailed.

## 2023-02-15 ENCOUNTER — Ambulatory Visit: Payer: Medicare Other | Admitting: "Endocrinology

## 2023-02-15 DIAGNOSIS — M25511 Pain in right shoulder: Secondary | ICD-10-CM | POA: Diagnosis not present

## 2023-02-15 DIAGNOSIS — M25611 Stiffness of right shoulder, not elsewhere classified: Secondary | ICD-10-CM | POA: Diagnosis not present

## 2023-02-15 DIAGNOSIS — M6281 Muscle weakness (generalized): Secondary | ICD-10-CM | POA: Diagnosis not present

## 2023-02-22 DIAGNOSIS — H6691 Otitis media, unspecified, right ear: Secondary | ICD-10-CM | POA: Diagnosis not present

## 2023-02-22 DIAGNOSIS — M6281 Muscle weakness (generalized): Secondary | ICD-10-CM | POA: Diagnosis not present

## 2023-02-22 DIAGNOSIS — M25611 Stiffness of right shoulder, not elsewhere classified: Secondary | ICD-10-CM | POA: Diagnosis not present

## 2023-02-22 DIAGNOSIS — M25511 Pain in right shoulder: Secondary | ICD-10-CM | POA: Diagnosis not present

## 2023-02-24 ENCOUNTER — Other Ambulatory Visit: Payer: Self-pay | Admitting: Family Medicine

## 2023-02-24 DIAGNOSIS — J4521 Mild intermittent asthma with (acute) exacerbation: Secondary | ICD-10-CM

## 2023-02-28 ENCOUNTER — Encounter: Payer: Self-pay | Admitting: Hematology

## 2023-02-28 ENCOUNTER — Telehealth: Payer: Medicare Other | Admitting: Family Medicine

## 2023-02-28 ENCOUNTER — Encounter: Payer: Self-pay | Admitting: Family Medicine

## 2023-02-28 DIAGNOSIS — H6690 Otitis media, unspecified, unspecified ear: Secondary | ICD-10-CM

## 2023-02-28 MED ORDER — CEFDINIR 300 MG PO CAPS: 300.0000 mg | ORAL_CAPSULE | Freq: Two times a day (BID) | ORAL | 0 refills | Status: DC

## 2023-02-28 NOTE — Progress Notes (Signed)
   Virtual Visit via video Note   Due to COVID-19 pandemic this visit was conducted virtually. This visit type was conducted due to national recommendations for restrictions regarding the COVID-19 Pandemic (e.g. social distancing, sheltering in place) in an effort to limit this patient's exposure and mitigate transmission in our community. All issues noted in this document were discussed and addressed.  A physical exam was not performed with this format.  I connected with  EVETTA FULP  on 02/28/23 at 1331 by video and verified that I am speaking with the correct person using two identifiers. KERRILEE MOYNAHAN is currently located in [place] and her son is currently with her during the visit. The provider, Gabriel Earing, FNP is located in their office at time of visit.  I discussed the limitations, risks, security and privacy concerns of performing an evaluation and management service by video  and the availability of in person appointments. I also discussed with the patient that there may be a patient responsible charge related to this service. The patient expressed understanding and agreed to proceed.  CC: ear pain  History and Present Illness:  Angelica Chessman was seen at [facility] on 02/22/23 for right ear pain. Dx with right ear infection as right TM as red. Started on Augmentin. She has been taking Augmentin as prescribed for 4 days without any improvement so far. Denies fever, drainage, URI symptoms. She reports pressure and sharp stabbing pain in the ear. No changes in hearing.     ROS As per HPI.     Observations/Objective: Alert and oriented. Respirations unlabored. No cyanosis. Non toxic appearing. Normal mood and behavior.   Assessment and Plan: Eyleen "Angelica Chessman" was seen today for ear pain.  Diagnoses and all orders for this visit:  Acute otitis media, unspecified otitis media type Reviewed UC note. Will swtich to [facility] since she has not had improvement with augmentin.  If not improvement after 72 hours, follow up for in person evaluation.  -     cefdinir (OMNICEF) 300 MG capsule; Take 1 capsule (300 mg total) by mouth 2 (two) times daily. 1 po BID     Follow Up Instructions: Return to office for new or worsening symptoms, or if symptoms persist.      I discussed the assessment and treatment plan with the patient. The patient was provided an opportunity to ask questions and all were answered. The patient agreed with the plan and demonstrated an understanding of the instructions.   The patient was advised to call back or seek an in-person evaluation if the symptoms worsen or if the condition fails to improve as anticipated.  The above assessment and management plan was discussed with the patient. The patient verbalized understanding of and has agreed to the management plan. Patient is aware to call the clinic if symptoms persist or worsen. Patient is aware when to return to the clinic for a follow-up visit. Patient educated on when it is appropriate to go to the emergency department.   Time call ended: 1336  I provided 5 minutes of face-to-face time during this encounter.    Gabriel Earing, FNP

## 2023-03-01 DIAGNOSIS — M6281 Muscle weakness (generalized): Secondary | ICD-10-CM | POA: Diagnosis not present

## 2023-03-01 DIAGNOSIS — M25511 Pain in right shoulder: Secondary | ICD-10-CM | POA: Diagnosis not present

## 2023-03-01 DIAGNOSIS — M25611 Stiffness of right shoulder, not elsewhere classified: Secondary | ICD-10-CM | POA: Diagnosis not present

## 2023-03-07 DIAGNOSIS — M6281 Muscle weakness (generalized): Secondary | ICD-10-CM | POA: Diagnosis not present

## 2023-03-07 DIAGNOSIS — M25511 Pain in right shoulder: Secondary | ICD-10-CM | POA: Diagnosis not present

## 2023-03-07 DIAGNOSIS — M25611 Stiffness of right shoulder, not elsewhere classified: Secondary | ICD-10-CM | POA: Diagnosis not present

## 2023-03-15 DIAGNOSIS — M25511 Pain in right shoulder: Secondary | ICD-10-CM | POA: Diagnosis not present

## 2023-03-15 DIAGNOSIS — M25611 Stiffness of right shoulder, not elsewhere classified: Secondary | ICD-10-CM | POA: Diagnosis not present

## 2023-03-15 DIAGNOSIS — M6281 Muscle weakness (generalized): Secondary | ICD-10-CM | POA: Diagnosis not present

## 2023-03-17 ENCOUNTER — Other Ambulatory Visit: Payer: Self-pay | Admitting: Family Medicine

## 2023-03-17 DIAGNOSIS — Z9109 Other allergy status, other than to drugs and biological substances: Secondary | ICD-10-CM

## 2023-03-17 DIAGNOSIS — G43109 Migraine with aura, not intractable, without status migrainosus: Secondary | ICD-10-CM

## 2023-03-17 DIAGNOSIS — M25611 Stiffness of right shoulder, not elsewhere classified: Secondary | ICD-10-CM | POA: Diagnosis not present

## 2023-03-17 DIAGNOSIS — M25511 Pain in right shoulder: Secondary | ICD-10-CM | POA: Diagnosis not present

## 2023-03-17 DIAGNOSIS — M6281 Muscle weakness (generalized): Secondary | ICD-10-CM | POA: Diagnosis not present

## 2023-03-17 DIAGNOSIS — H938X3 Other specified disorders of ear, bilateral: Secondary | ICD-10-CM

## 2023-03-21 ENCOUNTER — Other Ambulatory Visit: Payer: Self-pay | Admitting: Family Medicine

## 2023-03-21 DIAGNOSIS — J301 Allergic rhinitis due to pollen: Secondary | ICD-10-CM

## 2023-03-22 DIAGNOSIS — M25511 Pain in right shoulder: Secondary | ICD-10-CM | POA: Diagnosis not present

## 2023-03-22 DIAGNOSIS — M6281 Muscle weakness (generalized): Secondary | ICD-10-CM | POA: Diagnosis not present

## 2023-03-22 DIAGNOSIS — M25611 Stiffness of right shoulder, not elsewhere classified: Secondary | ICD-10-CM | POA: Diagnosis not present

## 2023-03-23 ENCOUNTER — Encounter: Payer: Self-pay | Admitting: Hematology

## 2023-03-28 ENCOUNTER — Ambulatory Visit: Payer: 59 | Admitting: Family Medicine

## 2023-03-28 ENCOUNTER — Encounter: Payer: Self-pay | Admitting: Family Medicine

## 2023-03-30 DIAGNOSIS — M6281 Muscle weakness (generalized): Secondary | ICD-10-CM | POA: Diagnosis not present

## 2023-03-30 DIAGNOSIS — M25611 Stiffness of right shoulder, not elsewhere classified: Secondary | ICD-10-CM | POA: Diagnosis not present

## 2023-03-30 DIAGNOSIS — J00 Acute nasopharyngitis [common cold]: Secondary | ICD-10-CM | POA: Diagnosis not present

## 2023-03-30 DIAGNOSIS — M25511 Pain in right shoulder: Secondary | ICD-10-CM | POA: Diagnosis not present

## 2023-03-31 ENCOUNTER — Ambulatory Visit: Payer: 59 | Admitting: Family Medicine

## 2023-04-01 DIAGNOSIS — M25511 Pain in right shoulder: Secondary | ICD-10-CM | POA: Diagnosis not present

## 2023-04-01 DIAGNOSIS — M6281 Muscle weakness (generalized): Secondary | ICD-10-CM | POA: Diagnosis not present

## 2023-04-01 DIAGNOSIS — M25611 Stiffness of right shoulder, not elsewhere classified: Secondary | ICD-10-CM | POA: Diagnosis not present

## 2023-04-08 ENCOUNTER — Ambulatory Visit (INDEPENDENT_AMBULATORY_CARE_PROVIDER_SITE_OTHER): Payer: 59 | Admitting: Family Medicine

## 2023-04-08 ENCOUNTER — Encounter: Payer: Self-pay | Admitting: Family Medicine

## 2023-04-08 VITALS — BP 102/65 | HR 70 | Temp 99.3°F | Ht 63.0 in | Wt 183.0 lb

## 2023-04-08 DIAGNOSIS — M6281 Muscle weakness (generalized): Secondary | ICD-10-CM | POA: Diagnosis not present

## 2023-04-08 DIAGNOSIS — J0140 Acute pansinusitis, unspecified: Secondary | ICD-10-CM | POA: Diagnosis not present

## 2023-04-08 DIAGNOSIS — F339 Major depressive disorder, recurrent, unspecified: Secondary | ICD-10-CM | POA: Insufficient documentation

## 2023-04-08 DIAGNOSIS — F411 Generalized anxiety disorder: Secondary | ICD-10-CM

## 2023-04-08 DIAGNOSIS — Z0001 Encounter for general adult medical examination with abnormal findings: Secondary | ICD-10-CM | POA: Diagnosis not present

## 2023-04-08 DIAGNOSIS — J4520 Mild intermittent asthma, uncomplicated: Secondary | ICD-10-CM

## 2023-04-08 DIAGNOSIS — M25511 Pain in right shoulder: Secondary | ICD-10-CM | POA: Diagnosis not present

## 2023-04-08 DIAGNOSIS — Z0000 Encounter for general adult medical examination without abnormal findings: Secondary | ICD-10-CM

## 2023-04-08 DIAGNOSIS — M25611 Stiffness of right shoulder, not elsewhere classified: Secondary | ICD-10-CM | POA: Diagnosis not present

## 2023-04-08 DIAGNOSIS — Z23 Encounter for immunization: Secondary | ICD-10-CM

## 2023-04-08 DIAGNOSIS — Z111 Encounter for screening for respiratory tuberculosis: Secondary | ICD-10-CM

## 2023-04-08 MED ORDER — AMOXICILLIN-POT CLAVULANATE 875-125 MG PO TABS: 1.0000 | ORAL_TABLET | Freq: Two times a day (BID) | ORAL | 0 refills | Status: AC

## 2023-04-08 NOTE — Patient Instructions (Signed)

## 2023-04-08 NOTE — Progress Notes (Signed)
Complete physical exam  Patient: Stephanie Dyer   DOB: 06-Dec-1975   48 y.o. Female  MRN: [id number]  Subjective:    Chief Complaint  Patient presents with   Annual Exam    Stephanie Dyer is a 48 y.o. female who presents today for a complete physical exam. She reports consuming a general diet. The patient has a physically strenuous job, but has no regular exercise apart from work.  She generally feels well. She reports sleeping well. She does have additional problems to discuss today.   She had surgery of her right shoulder last month. Still on light duty restrictions with ortho. Has decreased strength and ROM in right shoulder. Completing PT now.   Has had sinus pressure for weeks, significantly increased over last 5 days. Also reports ear pain. No fever or chills.   Most recent fall risk assessment:    04/08/2023   10:47 AM  Fall Risk   Falls in the past year? 0     Most recent depression screenings:    04/08/2023   10:46 AM 11/29/2022    9:27 AM 10/26/2022    3:17 PM  Depression screen PHQ 2/9  Decreased Interest 0 0 1  Down, Depressed, Hopeless 0 0 1  PHQ - 2 Score 0 0 2  Altered sleeping 0 2 3  Tired, decreased energy 0 2 3  Change in appetite 0 0 3  Feeling bad or failure about yourself  0 0 0  Trouble concentrating 0 1 2  Moving slowly or fidgety/restless 0 0 0  Suicidal thoughts 0 0 1  PHQ-9 Score 0 5 14  Difficult doing work/chores Not difficult at all Not difficult at all Somewhat difficult      04/08/2023   10:47 AM 11/29/2022    9:28 AM 10/26/2022    3:17 PM 09/02/2022   10:12 AM  GAD 7 : Generalized Anxiety Score  Nervous, Anxious, on Edge 0 1 3 0  Control/stop worrying 0 2 1 0  Worry too much - different things 0 2 1 0  Trouble relaxing 0 2 1 2   Restless 0 2 1 0  Easily annoyed or irritable 0 2 3 0  Afraid - awful might happen 0 0 1 0  Total GAD 7 Score 0 11 11 2   Anxiety Difficulty Not difficult at all Not difficult at all Somewhat  difficult Somewhat difficult       Past Medical History:  Diagnosis Date   Angio-edema    Ankylosing spondylitis (HCC)    Anxiety    Arthritis    Asthma    Cervical cancer (HCC) 1998   Elevated LFTs    Environmental allergies    GERD (gastroesophageal reflux disease)    History of gastric bypass    History of iron deficiency anemia    Hypoglycemia    Kidney stones    Lumbar back pain    Migraines    PCOS (polycystic ovarian syndrome)    Pseudotumor cerebri 2006   Thyroid cyst    Urticaria    Vitamin B12 deficiency 03/31/2020   Vitamin D deficiency       Patient Care Team: Gabriel Earing, FNP as PCP - General (Family Medicine) Lanelle Bal, DO as Consulting Physician (Internal Medicine)   Outpatient Medications Prior to Visit  Medication Sig   albuterol (2.5 MG/3ML) 0.083% NEBU 3 mL, albuterol (5 MG/ML) 0.5% NEBU 0.5 mL Inhale into the lungs.   albuterol (VENTOLIN HFA) 108 (  90 Base) MCG/ACT inhaler Inhale 2 puffs into the lungs every 6 (six) hours as needed for wheezing or shortness of breath. **NEEDS TO BE SEEN BEFORE NEXT REFILL**   B-D 3CC LUER-LOK SYR 25GX1" 25G X 1" 3 ML MISC Use for B12 injections every 2 weeks   cefdinir (OMNICEF) 300 MG capsule Take 1 capsule (300 mg total) by mouth 2 (two) times daily. 1 po BID   Cholecalciferol (VITAMIN D-3) 125 MCG (5000 UT) TABS Take 1 tablet by mouth 2 (two) times daily.   Continuous Glucose Sensor (FREESTYLE LIBRE 3 PLUS SENSOR) MISC Use to monitor glucose continuously as directed. Change sensor every 15 days.   cyanocobalamin (VITAMIN B12) 1000 MCG/ML injection Inject 1 mL (1,000 mcg total) into the muscle every 14 (fourteen) days.   EPINEPHrine 0.3 mg/0.3 mL IJ SOAJ injection INJECT 0.3MG  INTRAMUSCULARLY AS NEEDED   fexofenadine (ALLEGRA) 180 MG tablet Take 180 mg by mouth daily.   FLUoxetine (PROZAC) 20 MG capsule Take 1 capsule (20 mg total) by mouth daily.   fluticasone (FLONASE) 50 MCG/ACT nasal spray INSTILL  2 SPRAYS IN EACH NOSTRIL DAILY   montelukast (SINGULAIR) 10 MG tablet TAKE ONE TABLET BY MOUTH DAILY AT 9PM AT BEDTIME   Needles & Syringes MISC Injection supplies for every 3-week vitamin B12 injections x 6 months with 1 refill.   pantoprazole (PROTONIX) 40 MG tablet Take 1 tablet (40 mg total) by mouth daily.   topiramate (TOPAMAX) 50 MG tablet TAKE 1 AND 1/2 TABLETS (75 MG TOTAL) BY MOUTH DAILY AT 9AM   triamcinolone ointment (KENALOG) 0.1 % APPLY ONE APPLICATION TOPICALLY TWICE DAILY   [DISCONTINUED] acarbose (PRECOSE) 25 MG tablet Take 1 tablet (25 mg total) by mouth 3 (three) times daily with meals.   No facility-administered medications prior to visit.    ROS As per HPI.        Objective:     BP 102/65   Pulse 70   Temp 99.3 F (37.4 C) (Temporal)   Ht 5\' 3"  (1.6 m)   Wt 183 lb (83 kg)   LMP 11/04/2018   SpO2 97%   BMI 32.42 kg/m    Physical Exam Vitals and nursing note reviewed.  Constitutional:      General: She is not in acute distress.    Appearance: She is obese. She is not ill-appearing, toxic-appearing or diaphoretic.  HENT:     Head: Normocephalic and atraumatic.     Right Ear: Ear canal and external ear normal. No tenderness. A middle ear effusion is present. Tympanic membrane is bulging. Tympanic membrane is not erythematous or retracted.     Left Ear: Ear canal and external ear normal. No tenderness. A middle ear effusion is present. Tympanic membrane is not erythematous, retracted or bulging.     Nose: Congestion present.     Right Sinus: Maxillary sinus tenderness and frontal sinus tenderness present.     Left Sinus: Maxillary sinus tenderness and frontal sinus tenderness present.     Mouth/Throat:     Mouth: Mucous membranes are moist.     Pharynx: Oropharynx is clear. No oropharyngeal exudate or posterior oropharyngeal erythema.  Eyes:     General:        Right eye: No discharge.        Left eye: No discharge.     Extraocular Movements:  Extraocular movements intact.     Conjunctiva/sclera: Conjunctivae normal.     Pupils: Pupils are equal, round, and reactive to light.  Neck:     Thyroid: No thyroid mass, thyromegaly or thyroid tenderness.  Cardiovascular:     Rate and Rhythm: Normal rate and regular rhythm.     Heart sounds: Normal heart sounds. No murmur heard. Pulmonary:     Effort: Pulmonary effort is normal. No respiratory distress.     Breath sounds: Normal breath sounds. No wheezing.  Abdominal:     General: Bowel sounds are normal. There is no distension.     Palpations: Abdomen is soft.     Tenderness: There is no abdominal tenderness. There is no guarding or rebound.  Musculoskeletal:     Right shoulder: No effusion. Decreased range of motion (mild). Decreased strength (mild).     Cervical back: Neck supple. No rigidity.     Right lower leg: No edema.     Left lower leg: No edema.  Skin:    General: Skin is warm and dry.  Neurological:     General: No focal deficit present.     Mental Status: She is alert and oriented to person, place, and time.     Cranial Nerves: No cranial nerve deficit.     Sensory: No sensory deficit.     Motor: No weakness.     Coordination: Coordination normal.     Gait: Gait normal.  Psychiatric:        Mood and Affect: Mood normal.        Behavior: Behavior normal.      No results found for any visits on 04/08/23.     Assessment & Plan:    Routine Health Maintenance and Physical Exam  Stephanie Dyer "Angelica Chessman" was seen today for annual exam.  Diagnoses and all orders for this visit:  Routine general medical examination at a health care facility  Encounter for immunization -     Flu vaccine trivalent PF, 6mos and older(Flulaval,Afluria,Fluarix,Fluzone)  Generalized anxiety disorder Depression, recurrent (HCC) Well controlled on current regimen.   Mild intermittent asthma without complication Well controlled on current regimen.   Acute non-recurrent  pansinusitis Augmentin as below. Discussed symptomatic care and return precautions.  -     amoxicillin-clavulanate (AUGMENTIN) 875-125 MG tablet; Take 1 tablet by mouth 2 (two) times daily for 7 days.  Screening-pulmonary TB -     QuantiFERON-TB Gold Plus    Immunization History  Administered Date(s) Administered   Influenza, Seasonal, Injecte, Preservative Fre 04/08/2023   Influenza-Unspecified 01/05/2021   Tdap 03/29/2015    Health Maintenance  Topic Date Due   MAMMOGRAM  03/04/2023   Pneumococcal Vaccine 61-56 Years old (1 of 2 - PCV) 04/07/2024 (Originally 07/24/1983)   Colonoscopy  04/07/2024 (Originally 07/24/2022)   COVID-19 Vaccine (1 - 2024-25 season) 04/23/2024 (Originally 11/07/2022)   Medicare Annual Wellness (AWV)  07/23/2023   DTaP/Tdap/Td (2 - Td or Tdap) 03/28/2025   INFLUENZA VACCINE  Completed   Hepatitis C Screening  Completed   HIV Screening  Completed   HPV VACCINES  Aged Out    Discussed health benefits of physical activity, and encouraged her to engage in regular exercise appropriate for her age and condition.  Problem List Items Addressed This Visit       Respiratory   Asthma     Other   Generalized anxiety disorder   Depression, recurrent (HCC)   Other Visit Diagnoses       Routine general medical examination at a health care facility    -  Primary     Encounter for immunization  Relevant Orders   Flu vaccine trivalent PF, 6mos and older(Flulaval,Afluria,Fluarix,Fluzone) (Completed)     Acute non-recurrent pansinusitis       Relevant Medications   amoxicillin-clavulanate (AUGMENTIN) 875-125 MG tablet     Screening-pulmonary TB       Relevant Orders   QuantiFERON-TB Gold Plus      Return in about 1 year (around 04/07/2024) for CPE.   The patient indicates understanding of these issues and agrees with the plan.  Gabriel Earing, FNP

## 2023-04-13 ENCOUNTER — Other Ambulatory Visit: Payer: 59

## 2023-04-13 ENCOUNTER — Other Ambulatory Visit: Payer: Self-pay | Admitting: *Deleted

## 2023-04-13 DIAGNOSIS — H6593 Unspecified nonsuppurative otitis media, bilateral: Secondary | ICD-10-CM | POA: Diagnosis not present

## 2023-04-13 DIAGNOSIS — Z0184 Encounter for antibody response examination: Secondary | ICD-10-CM

## 2023-04-13 DIAGNOSIS — R059 Cough, unspecified: Secondary | ICD-10-CM | POA: Diagnosis not present

## 2023-04-14 ENCOUNTER — Encounter: Payer: Self-pay | Admitting: Family Medicine

## 2023-04-14 ENCOUNTER — Ambulatory Visit: Payer: 59 | Admitting: Family Medicine

## 2023-04-14 LAB — QUANTIFERON-TB GOLD PLUS
QuantiFERON Nil Value: 0.06 [IU]/mL
QuantiFERON TB1 Ag Value: 0.05 [IU]/mL
QuantiFERON TB2 Ag Value: 0.07 [IU]/mL

## 2023-04-14 LAB — VARICELLA ZOSTER ANTIBODY, IGG: Varicella zoster IgG: REACTIVE

## 2023-04-14 LAB — MEASLES/MUMPS/RUBELLA IMMUNITY
MUMPS ABS, IGG: <9 [AU]/ml — ABNORMAL LOW (ref >10.9)
RUBEOLA AB, IGG: 41.1 [AU]/ml (ref >16.4)
Rubella Antibodies, IGG: 3.76 {index} (ref >0.99)

## 2023-04-14 NOTE — Progress Notes (Signed)
 Not immune to Mumps. Can schedule with Triage RN to receive booster if needed.

## 2023-04-15 ENCOUNTER — Ambulatory Visit: Payer: 59 | Admitting: Allergy & Immunology

## 2023-04-18 ENCOUNTER — Encounter: Payer: Self-pay | Admitting: Family Medicine

## 2023-04-19 ENCOUNTER — Encounter: Payer: Self-pay | Admitting: Hematology

## 2023-04-19 ENCOUNTER — Ambulatory Visit: Payer: 59 | Admitting: Family Medicine

## 2023-04-19 ENCOUNTER — Ambulatory Visit: Payer: Self-pay | Admitting: Family Medicine

## 2023-04-19 ENCOUNTER — Ambulatory Visit (HOSPITAL_COMMUNITY)
Admission: RE | Admit: 2023-04-19 | Discharge: 2023-04-19 | Disposition: A | Discharge status: Home / Self Care | Payer: 59 | Source: Ambulatory Visit | Attending: Family Medicine | Admitting: Family Medicine

## 2023-04-19 VITALS — BP 101/71 | HR 86 | Ht 63.0 in | Wt 182.0 lb

## 2023-04-19 DIAGNOSIS — R051 Acute cough: Secondary | ICD-10-CM

## 2023-04-19 DIAGNOSIS — J984 Other disorders of lung: Secondary | ICD-10-CM | POA: Diagnosis not present

## 2023-04-19 DIAGNOSIS — M25511 Pain in right shoulder: Secondary | ICD-10-CM | POA: Diagnosis not present

## 2023-04-19 DIAGNOSIS — R059 Cough, unspecified: Secondary | ICD-10-CM | POA: Diagnosis not present

## 2023-04-19 DIAGNOSIS — M6281 Muscle weakness (generalized): Secondary | ICD-10-CM | POA: Diagnosis not present

## 2023-04-19 DIAGNOSIS — M25611 Stiffness of right shoulder, not elsewhere classified: Secondary | ICD-10-CM | POA: Diagnosis not present

## 2023-04-19 MED ORDER — AMOXICILLIN-POT CLAVULANATE 875-125 MG PO TABS: 1.0000 | ORAL_TABLET | Freq: Two times a day (BID) | ORAL | 0 refills | Status: DC

## 2023-04-19 NOTE — Telephone Encounter (Signed)
Called patient and left a generic, HIPAA compliant voicemail for patient to call us back. 1st attempt.

## 2023-04-19 NOTE — Patient Instructions (Signed)
We will call with results of xray and send medication in accordingly.  Discontinue Tamiflu.

## 2023-04-19 NOTE — Telephone Encounter (Signed)
Copied From CRM [id number] Reason for Triage: Patient was diagnosed with FLU A and she believes her symptoms are turning into bronchitis, she has a moldy tasting cough with bright green mucus.    Chief Complaint: Cough Symptoms: Cough, shortness of breath, chest tightness  Frequency: Frequent cough  Pertinent Negatives: Patient denies fever Disposition: [] ED /[] Urgent Care (no appt availability in office) / [x] Appointment(In office/virtual)/ []  [facility]/ [] Home Care/ [] Refused Recommended Disposition /[] [place] [facility]/ []  Follow-up with PCP Additional Notes: Patient reports that she developed a cough 3 days ago. She states that with her cough she is now experiencing shortness of breath and chest tightness when coughing. She states she had a fever that is now resolved. Patient tested positive for the flu but is concerned she may also have bronchitis. Appointment made in an available office today.      Reason for Disposition  Patient is HIGH RISK (e.g., age > 64 years, pregnant, HIV+, or chronic medical condition)  Answer Assessment - Initial Assessment Questions 1. WORST SYMPTOM: "What is your worst symptom?" (e.g., cough, runny nose, muscle aches, headache, sore throat, fever)      Cough  2. ONSET: "When did your flu symptoms start?"      3 days ago  3. COUGH: "How bad is the cough?"       Mild to moderate  4. RESPIRATORY DISTRESS: "Describe your breathing."      Mild difficulty breathing due to cough  5. FEVER: "Do you have a fever?" If Yes, ask: "What is your temperature, how was it measured, and when did it start?"     Not currently  6. EXPOSURE: "Were you exposed to someone with influenza?"       Yes 7. FLU VACCINE: "Did you get a flu shot this year?"     Yes 8. HIGH RISK DISEASE: "Do you have any chronic medical problems?" (e.g., heart or lung disease, asthma, weak immune system, or other HIGH RISK conditions)     Asthma, history of pulmonary embolism  9.  PREGNANCY: "Is there any chance you are pregnant?" "When was your last menstrual period?"     No 10. OTHER SYMPTOMS: "Do you have any other symptoms?"  (e.g., runny nose, muscle aches, headache, sore throat)       Chest tightness  Protocols used: Influenza (Flu) - [facility]

## 2023-04-20 DIAGNOSIS — R051 Acute cough: Secondary | ICD-10-CM | POA: Insufficient documentation

## 2023-04-20 MED ORDER — PREDNISONE 50 MG PO TABS: 50.0000 mg | ORAL_TABLET | Freq: Every day | ORAL | 0 refills | Status: AC

## 2023-04-20 NOTE — Assessment & Plan Note (Signed)
Patient with persistent productive cough, chest tightness, and shortness of air.  Chest x-ray was obtained and did not reveal any focal consolidation.  Placing on Augmentin to cover for secondary bacterial bronchitis.  Also placing on prednisone and given patient's chest tightness and shortness of breath in the setting of asthma.

## 2023-04-20 NOTE — Progress Notes (Signed)
Subjective:  Patient ID: Stephanie Dyer, female    DOB: 05/19/1975  Age: 48 y.o. MRN: [id number]  CC:   Chief Complaint  Patient presents with   flu follow up    Sob, coughing up green thick mucus, mildew taste in mouth, fatigue still on tamiflu    HPI:  48 year old female presents for evaluation of the above.  Patient diagnosed with influenza last week.  Patient reports that she continues to have significant cough which is productive of discolored sputum.  She reports associated shortness of breath and chest tightness.  She has been using albuterol regularly with some improvement.  She also reports fatigue.  Fever has resolved.  Patient has been taking Tamiflu but has been taking it only once a day.  She misread the prescription.  Advised discontinuation.  Patient Active Problem List   Diagnosis Date Noted   Acute cough 04/20/2023   Depression, recurrent (HCC) 04/08/2023   Class 1 obesity due to excess calories with serious comorbidity and body mass index (BMI) of 31.0 to 31.9 in adult 10/12/2022   Primary insomnia 11/06/2021   History of pulmonary embolus (PE) 03/17/2021   Borderline personality disorder (HCC) 03/17/2021   Obesity (BMI 30.0-34.9) 03/17/2021   Iron deficiency 08/29/2020   Vitamin B12 deficiency 03/31/2020   History of systemic reaction to hymenoptera sting 01/22/2020   Generalized anxiety disorder 12/06/2019   Ankylosing spondylitis (HCC)    Thyroid nodule    Asthma    Environmental allergies    Migraines    Vitamin D deficiency    PCOS (polycystic ovarian syndrome)    GERD (gastroesophageal reflux disease)     Social Hx   Social History   Socioeconomic History   Marital status: Single    Spouse name: Not on file   Number of children: 2   Years of education: Not on file   Highest education level: GED or equivalent  Occupational History   Not on file  Tobacco Use   Smoking status: Former    Current packs/day: 0.00    Types: Cigarettes     Quit date: 11/27/2012    Years since quitting: 10.4   Smokeless tobacco: Never  Vaping Use   Vaping status: Every Day  Substance and Sexual Activity   Alcohol use: Not Currently   Drug use: Not Currently    Types: Marijuana   Sexual activity: Yes    Birth control/protection: Other-see comments  Other Topics Concern   Not on file  Social History Narrative   Makayla- 35, Alex- 7   Social Drivers of Corporate investment banker Strain: Medium Risk (04/12/2023)   Overall Financial Resource Strain (CARDIA)    Difficulty of Paying Living Expenses: Somewhat hard  Food Insecurity: Food Insecurity Present (04/12/2023)   Hunger Vital Sign    Worried About Running Out of Food in the Last Year: Sometimes true    Ran Out of Food in the Last Year: Never true  Transportation Needs: No Transportation Needs (04/12/2023)   PRAPARE - Administrator, Civil Service (Medical): No    Lack of Transportation (Non-Medical): No  Physical Activity: Sufficiently Active (04/12/2023)   Exercise Vital Sign    Days of Exercise per Week: 7 days    Minutes of Exercise per Session: 30 min  Stress: Stress Concern Present (04/12/2023)   [employer] of Occupational Health - Occupational Stress Questionnaire    Feeling of Stress : To some extent  Social Connections: Moderately Integrated (  04/12/2023)   Social Connection and Isolation Panel [NHANES]    Frequency of Communication with Friends and Family: More than three times a week    Frequency of Social Gatherings with Friends and Family: Once a week    Attends Religious Services: More than 4 times per year    Active Member of [employer] or Organizations: Yes    Attends Engineer, structural: More than 4 times per year    Marital Status: Divorced    Review of Systems Per HPI  Objective:  BP 101/71   Pulse 86   Ht 5\' 3"  (1.6 m)   Wt 182 lb (82.6 kg)   LMP 11/04/2018   SpO2 96%   BMI 32.24 kg/m      04/19/2023    2:54 PM 04/08/2023    10:43 AM 02/02/2023    2:40 PM  BP/Weight  Systolic BP 101 102 110  Diastolic BP 71 65 67  Wt. (Lbs) 182 183   BMI 32.24 kg/m2 32.42 kg/m2     Physical Exam Constitutional:      General: She is not in acute distress.    Appearance: Normal appearance.  HENT:     Head: Normocephalic and atraumatic.  Eyes:     General:        Right eye: No discharge.        Left eye: No discharge.     Conjunctiva/sclera: Conjunctivae normal.  Cardiovascular:     Rate and Rhythm: Normal rate and regular rhythm.  Pulmonary:     Effort: Pulmonary effort is normal.     Comments: Coarse breath sounds. Neurological:     Mental Status: She is alert.  Psychiatric:        Mood and Affect: Mood normal.        Behavior: Behavior normal.     Lab Results  Component Value Date   WBC 5.9 01/24/2023   HGB 13.1 01/24/2023   HCT 40.7 01/24/2023   PLT 217 01/24/2023   GLUCOSE 86 09/08/2022   CHOL 146 09/08/2022   TRIG 58 09/08/2022   HDL 65 09/08/2022   LDLCALC 69 09/08/2022   ALT 20 09/08/2022   AST 17 09/08/2022   NA 145 (H) 09/08/2022   K 4.0 09/08/2022   CL 109 (H) 09/08/2022   CREATININE 0.66 09/08/2022   BUN 11 09/08/2022   CO2 22 09/08/2022   TSH 1.980 09/08/2022   HGBA1C 5.0 05/20/2022     Assessment & Plan:   Problem List Items Addressed This Visit       Other   Acute cough - Primary   Patient with persistent productive cough, chest tightness, and shortness of air.  Chest x-ray was obtained and did not reveal any focal consolidation.  Placing on Augmentin to cover for secondary bacterial bronchitis.  Also placing on prednisone and given patient's chest tightness and shortness of breath in the setting of asthma.      Relevant Orders   DG Chest 2 View (Completed)    Meds ordered this encounter  Medications   amoxicillin-clavulanate (AUGMENTIN) 875-125 MG tablet    Sig: Take 1 tablet by mouth 2 (two) times daily.    Dispense:  14 tablet    Refill:  0   predniSONE  (DELTASONE) 50 MG tablet    Sig: Take 1 tablet (50 mg total) by mouth daily for 5 days.    Dispense:  5 tablet    Refill:  0    Follow-up:  Return  if symptoms worsen or fail to improve.  Everlene Other DO [facility]

## 2023-04-22 DIAGNOSIS — M6281 Muscle weakness (generalized): Secondary | ICD-10-CM | POA: Diagnosis not present

## 2023-04-22 DIAGNOSIS — M25611 Stiffness of right shoulder, not elsewhere classified: Secondary | ICD-10-CM | POA: Diagnosis not present

## 2023-04-22 DIAGNOSIS — M25511 Pain in right shoulder: Secondary | ICD-10-CM | POA: Diagnosis not present

## 2023-04-25 DIAGNOSIS — M25511 Pain in right shoulder: Secondary | ICD-10-CM | POA: Diagnosis not present

## 2023-04-25 DIAGNOSIS — M25611 Stiffness of right shoulder, not elsewhere classified: Secondary | ICD-10-CM | POA: Diagnosis not present

## 2023-04-25 DIAGNOSIS — M6281 Muscle weakness (generalized): Secondary | ICD-10-CM | POA: Diagnosis not present

## 2023-04-27 DIAGNOSIS — M25611 Stiffness of right shoulder, not elsewhere classified: Secondary | ICD-10-CM | POA: Diagnosis not present

## 2023-04-27 DIAGNOSIS — M6281 Muscle weakness (generalized): Secondary | ICD-10-CM | POA: Diagnosis not present

## 2023-04-27 DIAGNOSIS — M25511 Pain in right shoulder: Secondary | ICD-10-CM | POA: Diagnosis not present

## 2023-04-28 ENCOUNTER — Ambulatory Visit: Payer: 59 | Admitting: *Deleted

## 2023-04-28 ENCOUNTER — Ambulatory Visit: Payer: 59 | Admitting: Family Medicine

## 2023-04-28 DIAGNOSIS — Z23 Encounter for immunization: Secondary | ICD-10-CM | POA: Diagnosis not present

## 2023-04-28 NOTE — Progress Notes (Signed)
Patient is in office today for a nurse visit for  MMRV . Patient Injection was given in the  Left arm. Patient tolerated injection well.

## 2023-05-05 ENCOUNTER — Encounter: Payer: Self-pay | Admitting: Family Medicine

## 2023-05-18 ENCOUNTER — Other Ambulatory Visit: Payer: Self-pay | Admitting: Family Medicine

## 2023-05-18 DIAGNOSIS — G43109 Migraine with aura, not intractable, without status migrainosus: Secondary | ICD-10-CM

## 2023-05-19 ENCOUNTER — Ambulatory Visit: Payer: Medicare Other | Admitting: "Endocrinology

## 2023-05-20 ENCOUNTER — Telehealth: Payer: Self-pay | Admitting: Family Medicine

## 2023-05-20 NOTE — Telephone Encounter (Signed)
 Last Fill: 05/18/23    Routing to provider for review/authorization.   Copied from CRM [id number]. Topic: Clinical - Medication Refill >> May 20, 2023  5:22 PM Everette C wrote: Most Recent Primary Care Visit:  Provider: Tommie Sams  Department: [facility]  Visit Type: ACUTE  Date: 04/19/2023  Medication: topiramate (TOPAMAX) 50 MG tablet [[id number]]  Has the patient contacted their pharmacy? Yes (Agent: If no, request that the patient contact the pharmacy for the refill. If patient does not wish to contact the pharmacy document the reason why and proceed with request.) (Agent: If yes, when and what did the pharmacy advise?)  Is this the correct pharmacy for this prescription? Yes If no, delete pharmacy and type the correct one.  This is the patient's preferred pharmacy:  [facility] - [place], [place] - [address] [place] [address] Phone: [phone] Fax: [phone]   Has the prescription been filled recently? Yes  Is the patient out of the medication? Yes  Has the patient been seen for an appointment in the last year OR does the patient have an upcoming appointment? Yes  Can we respond through MyChart? No  Agent: Please be advised that Rx refills may take up to 3 business days. We ask that you follow-up with your pharmacy.

## 2023-05-27 ENCOUNTER — Other Ambulatory Visit: Payer: Self-pay | Admitting: Family Medicine

## 2023-05-27 DIAGNOSIS — G43109 Migraine with aura, not intractable, without status migrainosus: Secondary | ICD-10-CM

## 2023-05-27 NOTE — Telephone Encounter (Signed)
 Copied from CRM [id number]. Topic: Clinical - Medication Refill >> May 27, 2023 12:23 PM Turkey B wrote: Most Recent Primary Care Visit:  Provider: Tommie Sams  Department: [facility]  Visit Type: ACUTE  Date: 04/19/2023  Medication: topiramate (TOPAMAX) 50 MG tablet  Has the patient contacted their pharmacy? yes (Agent: If yes, when and what did the pharmacy advise?)pharmacy called directly in  Is this the correct pharmacy for this prescription? yes This is the patient's preferred pharmacy:   [facility] - [place], [place] - [address] [place] [address] Phone: [phone] Fax: [phone]   Has the prescription been filled recently? no  Is the patient out of the medication? yes  Has the patient been seen for an appointment in the last year OR does the patient have an upcoming appointment? yes  Can we respond through MyChart? yes  Agent: Please be advised that Rx refills may take up to 3 business days. We ask that you follow-up with your pharmacy.

## 2023-06-16 ENCOUNTER — Emergency Department (HOSPITAL_COMMUNITY)
Admission: EM | Admit: 2023-06-16 | Discharge: 2023-06-16 | Disposition: A | Discharge status: Home / Self Care | Attending: Emergency Medicine | Admitting: Emergency Medicine

## 2023-06-16 ENCOUNTER — Other Ambulatory Visit: Payer: Self-pay

## 2023-06-16 ENCOUNTER — Encounter (HOSPITAL_COMMUNITY): Payer: Self-pay | Admitting: Emergency Medicine

## 2023-06-16 ENCOUNTER — Emergency Department (HOSPITAL_COMMUNITY)

## 2023-06-16 ENCOUNTER — Ambulatory Visit: Payer: Self-pay

## 2023-06-16 DIAGNOSIS — N281 Cyst of kidney, acquired: Secondary | ICD-10-CM | POA: Diagnosis not present

## 2023-06-16 DIAGNOSIS — R1032 Left lower quadrant pain: Secondary | ICD-10-CM | POA: Diagnosis not present

## 2023-06-16 DIAGNOSIS — R109 Unspecified abdominal pain: Secondary | ICD-10-CM | POA: Diagnosis not present

## 2023-06-16 DIAGNOSIS — K449 Diaphragmatic hernia without obstruction or gangrene: Secondary | ICD-10-CM | POA: Diagnosis not present

## 2023-06-16 DIAGNOSIS — K769 Liver disease, unspecified: Secondary | ICD-10-CM

## 2023-06-16 DIAGNOSIS — Z91040 Latex allergy status: Secondary | ICD-10-CM | POA: Insufficient documentation

## 2023-06-16 DIAGNOSIS — Z91010 Allergy to peanuts: Secondary | ICD-10-CM | POA: Insufficient documentation

## 2023-06-16 DIAGNOSIS — R160 Hepatomegaly, not elsewhere classified: Secondary | ICD-10-CM | POA: Diagnosis not present

## 2023-06-16 LAB — URINALYSIS, ROUTINE W REFLEX MICROSCOPIC
Bilirubin Urine: NEGATIVE
Glucose, UA: NEGATIVE mg/dL
Hgb urine dipstick: NEGATIVE
Ketones, ur: NEGATIVE mg/dL
Leukocytes,Ua: NEGATIVE
Nitrite: NEGATIVE
Protein, ur: NEGATIVE mg/dL
Specific Gravity, Urine: 1.004 — ABNORMAL LOW (ref 1.005–1.030)
pH: 7 (ref 5.0–8.0)

## 2023-06-16 MED ORDER — KETOROLAC TROMETHAMINE 30 MG/ML IJ SOLN
30.0000 mg | Freq: Once | INTRAMUSCULAR | Status: AC
  Administered 2023-06-16: 30 mg via INTRAVENOUS
  Filled 2023-06-16: qty 1

## 2023-06-16 MED ORDER — TRAMADOL HCL 50 MG PO TABS: 50.0000 mg | ORAL_TABLET | Freq: Four times a day (QID) | ORAL | 0 refills | Status: DC | PRN

## 2023-06-16 NOTE — Telephone Encounter (Signed)
 Chief Complaint: flank pain Symptoms: left flank pain burning and urinary frequency Frequency: x today Pertinent Negatives: Patient denies fever Disposition: [] ED /[] Urgent Care (no appt availability in office) / [x] Appointment(In office/virtual)/ []  [facility]/ [] Home Care/ [] Refused Recommended Disposition /[] [facility]/ []  Follow-up with PCP Additional Notes: pt states that she noticed some frequency and burning with urination on yesterday and this morning started having left side flank pain. States pain constant dull ache at 3/10 but then sharp pain comes in waves. States taking acetaminophen and ibuprofen.  States in class and then has work after so she can't be seen until tomorrow after 10am.  Copied from CRM [id number]. Topic: Clinical - Red Word Triage >> Jun 16, 2023 10:24 AM Shelah Lewandowsky wrote: Red Word that prompted transfer to Nurse Triage: kidney stone- back and flank pain left side Reason for Disposition  Pain or burning with passing urine (urination)  Answer Assessment - Initial Assessment Questions 1. LOCATION: "Where does it hurt?" (e.g., left, right)     left 2. ONSET: "When did the pain start?"     This morning 3. SEVERITY: "How bad is the pain?" (e.g., Scale 1-10; mild, moderate, or severe)   - MILD (1-3): doesn't interfere with normal activities    - MODERATE (4-7): interferes with normal activities or awakens from sleep    - SEVERE (8-10): excruciating pain and patient unable to do normal activities (stays in bed)       3-4/10 4. PATTERN: "Does the pain come and go, or is it constant?"      Comes and goes sharp but constant dull ache 5. CAUSE: "What do you think is causing the pain?"     Kidney stones 6. OTHER SYMPTOMS:  "Do you have any other symptoms?" (e.g., fever, abdomen pain, vomiting, leg weakness, burning with urination, blood in urine)     Frequency and burning,  Protocols used: Flank Pain-A-AH

## 2023-06-16 NOTE — ED Triage Notes (Signed)
 Pt c/o L flank pain 7/10 throbbing especially with urination, states Hx of kidney stones and thinks she may have a UTI or kidney stone

## 2023-06-16 NOTE — ED Provider Notes (Signed)
 [facility] AT [facility] Provider Note   CSN: [id number] Arrival date & time: 06/16/23  1525     History  Chief Complaint  Patient presents with   Flank Pain    Stephanie Dyer is a 48 y.o. female.  Patient complains of severe left flank pain.  Patient reports that she has had urinary tract infections and kidney stones in the past.  Patient states that this feels like she has a kidney stone.  Patient reports that she has pain in her back that radiates around to her abdomen.  Patient denies any fever she denies any chills.  Patient denies any nausea vomiting or diarrhea.  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  The history is provided by the patient. No language interpreter was used.  Flank Pain       Home Medications Prior to Admission medications   Medication Sig Start Date End Date Taking? Authorizing Provider  albuterol (2.5 MG/3ML) 0.083% NEBU 3 mL, albuterol (5 MG/ML) 0.5% NEBU 0.5 mL Inhale into the lungs.    [provider]  albuterol (VENTOLIN HFA) 108 (90 Base) MCG/ACT inhaler Inhale 2 puffs into the lungs every 6 (six) hours as needed for wheezing or shortness of breath. **NEEDS TO BE SEEN BEFORE NEXT REFILL** 02/24/23   Albertha Huger, FNP  amoxicillin-clavulanate (AUGMENTIN) 875-125 MG tablet Take 1 tablet by mouth 2 (two) times daily. 04/19/23   Cook, Jayce G, DO  B-D 3CC LUER-LOK SYR 25GX1" 25G X 1" 3 ML MISC Use for B12 injections every 2 weeks 01/31/23   Sheril Dines M, PA-C  Cholecalciferol (VITAMIN D-3) 125 MCG (5000 UT) TABS Take 1 tablet by mouth 2 (two) times daily.    [provider]  Continuous Glucose Sensor (FREESTYLE LIBRE 3 PLUS SENSOR) MISC Use to monitor glucose continuously as directed. Change sensor every 15 days. 12/23/22   Nida, Gebreselassie W, MD  cyanocobalamin (VITAMIN B12) 1000 MCG/ML injection Inject 1 mL (1,000 mcg total) into the muscle every  14 (fourteen) days. 01/31/23   Sonnie Dusky, PA-C  EPINEPHrine 0.3 mg/0.3 mL IJ SOAJ injection INJECT 0.3MG  INTRAMUSCULARLY AS NEEDED 12/31/22   Albertha Huger, FNP  FLUoxetine (PROZAC) 20 MG capsule Take 1 capsule (20 mg total) by mouth daily. 09/10/22   Albertha Huger, FNP  fluticasone (FLONASE) 50 MCG/ACT nasal spray INSTILL 2 SPRAYS IN EACH NOSTRIL DAILY 03/17/23   Albertha Huger, FNP  Needles & Syringes MISC Injection supplies for every 3-week vitamin B12 injections x 6 months with 1 refill. 07/30/22   Sonnie Dusky, PA-C  pantoprazole (PROTONIX) 40 MG tablet Take 1 tablet (40 mg total) by mouth daily. 01/18/23   Albertha Huger, FNP  topiramate (TOPAMAX) 50 MG tablet TAKE 1 AND 1/2 TABLETS (75MG  TOTAL) BY MOUTH DAILY AT 9AM 05/18/23   Albertha Huger, FNP      Allergies    Codeine, Morphine, Other, Banana, Fioricet [butalbital-apap-caffeine], Nsaids, Vancomycin, Venlafaxine, Watermelon flavoring agent (non-screening), Zolpidem, Latex, Peanut-containing drug products, and Shellfish allergy    Review of Systems   Review of Systems  Genitourinary:  Positive for flank pain.  All other systems reviewed and are negative.   Physical Exam Updated Vital Signs BP 100/69 (BP Location: Right Arm)   Pulse 71   Temp 98.6 F (37 C) (Oral)   Resp 18   LMP 11/04/2018   SpO2 100%  Physical Exam Vitals reviewed.  Constitutional:  Appearance: Normal appearance.  HENT:     Head: Normocephalic.     Mouth/Throat:     Mouth: Mucous membranes are moist.  Eyes:     Pupils: Pupils are equal, round, and reactive to light.  Cardiovascular:     Rate and Rhythm: Normal rate.  Pulmonary:     Effort: Pulmonary effort is normal.  Abdominal:     General: Abdomen is flat.  Skin:    General: Skin is warm.  Neurological:     General: No focal deficit present.     Mental Status: She is alert.  Psychiatric:        Mood and Affect: Mood normal.     ED Results /  Procedures / Treatments   Labs (all labs ordered are listed, but only abnormal results are displayed) Labs Reviewed  URINALYSIS, ROUTINE W REFLEX MICROSCOPIC - Abnormal; Notable for the following components:      Result Value   Color, Urine COLORLESS (*)    Specific Gravity, Urine 1.004 (*)    All other components within normal limits    EKG None  Radiology CT Renal Stone Study Result Date: 06/16/2023 CLINICAL DATA:  Abdominal/flank pain, stone suspected Pt c/o L flank pain 7/10 throbbing especially with urination, states Hx of kidney stones and thinks she may have a UTI or kidney stone EXAM: CT ABDOMEN AND PELVIS WITHOUT CONTRAST TECHNIQUE: Multidetector CT imaging of the abdomen and pelvis was performed following the standard protocol without IV contrast. RADIATION DOSE REDUCTION: This exam was performed according to the departmental dose-optimization program which includes automated exposure control, adjustment of the mA and/or kV according to patient size and/or use of iterative reconstruction technique. COMPARISON:  CT abdomen pelvis 08/12/2020 FINDINGS: Lower chest: Left base atelectasis.  Stable small hiatal hernia. Hepatobiliary: Hyperdense liver. Enlarged liver measuring up to 19 cm. No focal liver abnormality. Status post cholecystectomy. No biliary dilatation. Pancreas: No focal lesion. Normal pancreatic contour. No surrounding inflammatory changes. No main pancreatic ductal dilatation. Spleen: Normal in size without focal abnormality. Adrenals/Urinary Tract: No adrenal nodule bilaterally. No nephrolithiasis and no hydronephrosis. 1.3 cm fluid density lesion along the inferior pole of the right kidney likely represents a simple renal cyst. Simple renal cysts, in the absence of clinically indicated signs/symptoms, require no independent follow-up. No ureterolithiasis or hydroureter. The urinary bladder is unremarkable. Stomach/Bowel: Gastric sleeve surgical changes noted. Stomach is within  normal limits. No evidence of bowel wall thickening or dilatation. Appendix appears normal. Vascular/Lymphatic: No abdominal aorta or iliac aneurysm. No abdominal, pelvic, or inguinal lymphadenopathy. Reproductive: Status post hysterectomy. No adnexal masses. Other: No intraperitoneal free fluid. No intraperitoneal free gas. No organized fluid collection. Musculoskeletal: No abdominal wall hernia or abnormality. No suspicious lytic or blastic osseous lesions. No acute displaced fracture. Chronic appearing T11-T12 vertebral body height loss. IMPRESSION: 1. Stable small hiatal hernia in a patient with gastric sleeve surgical changes. 2. Hepatomegaly. 3. Nonspecific hyperdense liver-could be related to drug toxicity or hemochromatosis. Electronically Signed   By: Morgane  Naveau M.D.   On: 06/16/2023 20:18    Procedures Procedures    Medications Ordered in ED Medications - No data to display  ED Course/ Medical Decision Making/ A&P                                 Medical Decision Making Patient complains of left-sided flank pain.  Patient reports pain feels like previous kidney stones.  Amount and/or Complexity of Data Reviewed Labs: ordered.    Details: UA is negative Radiology: ordered and independent interpretation performed. Decision-making details documented in ED Course.    Details: CT renal scan shows hepatomegaly nonspecific hyperdense liver, small hiatal hernia and chronic T11-T12 vertebral body fracture.  Risk Prescription drug management. Risk Details: Patient is counseled on results.  Patient is advised that she needs to follow-up with gastroenterology.  Patient given the phone number for Dr. Ivory Marseille.  Patient is given Toradol 30 mg IV here and a prescription for tramadol.  Patient discharged in stable condition she is advised to follow-up with her primary care doctor for recheck of back pain.           Final Clinical Impression(s) / ED Diagnoses Final diagnoses:   Left flank pain  Liver disorder    Rx / DC Orders ED Discharge Orders          Ordered    traMADol (ULTRAM) 50 MG tablet  Every 6 hours PRN        06/16/23 2045          An After Visit Summary was printed and given to the patient.     Tyrian Peart K, PA-C 06/16/23 2046    Cheyenne Cotta, MD 06/18/23 1145

## 2023-06-16 NOTE — Discharge Instructions (Signed)
 Return if any problems.

## 2023-06-17 ENCOUNTER — Telehealth: Payer: Self-pay | Admitting: Family Medicine

## 2023-06-17 ENCOUNTER — Encounter: Payer: Self-pay | Admitting: Family Medicine

## 2023-06-17 ENCOUNTER — Ambulatory Visit (INDEPENDENT_AMBULATORY_CARE_PROVIDER_SITE_OTHER): Admitting: Family Medicine

## 2023-06-17 ENCOUNTER — Ambulatory Visit

## 2023-06-17 VITALS — BP 92/57 | HR 70 | Temp 97.9°F | Ht 63.0 in | Wt 182.0 lb

## 2023-06-17 DIAGNOSIS — R109 Unspecified abdominal pain: Secondary | ICD-10-CM | POA: Diagnosis not present

## 2023-06-17 DIAGNOSIS — R160 Hepatomegaly, not elsewhere classified: Secondary | ICD-10-CM

## 2023-06-17 LAB — URINALYSIS, ROUTINE W REFLEX MICROSCOPIC
Bilirubin, UA: NEGATIVE
Glucose, UA: NEGATIVE
Ketones, UA: NEGATIVE
Leukocytes,UA: NEGATIVE
Nitrite, UA: NEGATIVE
Protein,UA: NEGATIVE
RBC, UA: NEGATIVE
Specific Gravity, UA: >1.03 — ABNORMAL HIGH (ref 1.005–1.030)
Urobilinogen, Ur: 0.2 mg/dL (ref 0.2–1.0)
pH, UA: 5.5 (ref 5.0–7.5)

## 2023-06-17 LAB — COAGUCHEK XS/INR WAIVED
INR: 1.3 — ABNORMAL HIGH (ref 0.9–1.1)
Prothrombin Time: 15.5 s

## 2023-06-17 MED ORDER — HYDROCODONE-ACETAMINOPHEN 7.5-325 MG PO TABS: 1.0000 | ORAL_TABLET | Freq: Three times a day (TID) | ORAL | 0 refills | Status: DC | PRN

## 2023-06-17 NOTE — Progress Notes (Signed)
 Established Patient Office Visit  Subjective   Patient ID: Stephanie Dyer, female    DOB: 1975-08-26  Age: 48 y.o. MRN: [id number]  Chief Complaint  Patient presents with   ER follow up    Back Pain This is a new problem. The current episode started in the past 7 days. The problem occurs constantly. The problem is unchanged. The pain is present in the thoracic spine. The quality of the pain is described as aching. The pain does not radiate. The pain is at a severity of 6/10. The pain is moderate. Exacerbated by: voiding. Pertinent negatives include no abdominal pain, bladder incontinence, bowel incontinence, dysuria, fever, leg pain, numbness, paresis, paresthesias, perianal numbness, tingling or weakness. She has tried NSAIDs, heat and analgesics (massage, tramadol, toradol injection) for the symptoms. The treatment provided no relief.  Hx of thoracic fracture. Pain was radiating around abdomen but no longer is. No injury.   Was seen in ER. Had renal CT with no acute findings. Negative UA. Hyperdensity was noted of liver.   Mild nausea with increased pain. No RUQ pain, vomiting, jaundice.   No alcohol in 17 months though she has a hx of heavy alcohol use up until 3 years ago. Started drinking at age 89, was heavy drinker for most of the days from age 14 until 3 years ago. Has hx of elevated LFTs in remote past due to this. Reports hx of abdominal pain and copper taste in her mouth in the past when she was having a "liver flare". No recreational drug use. No changes in medications. No supplements.       Review of Systems  Constitutional:  Negative for fever.  Gastrointestinal:  Negative for abdominal pain and bowel incontinence.  Genitourinary:  Negative for bladder incontinence and dysuria.  Musculoskeletal:  Positive for back pain.  Neurological:  Negative for tingling, weakness, numbness and paresthesias.      Objective:     BP (!) 92/57   Pulse 70   Temp 97.9 F  (36.6 C) (Temporal)   Ht 5\' 3"  (1.6 m)   Wt 182 lb (82.6 kg)   LMP 11/04/2018   SpO2 96%   BMI 32.24 kg/m    Physical Exam Vitals and nursing note reviewed.  Constitutional:      General: She is not in acute distress.    Appearance: Normal appearance. She is not ill-appearing, toxic-appearing or diaphoretic.  Eyes:     General: No scleral icterus. Pulmonary:     Effort: Pulmonary effort is normal. No respiratory distress.  Abdominal:     General: Bowel sounds are normal. There is no distension.     Palpations: Abdomen is soft.     Tenderness: There is no abdominal tenderness. There is no right CVA tenderness, left CVA tenderness, guarding or rebound.  Musculoskeletal:     Thoracic back: Tenderness (left paraspinal) present. No swelling, edema, deformity, lacerations or bony tenderness.     Right lower leg: No edema.     Left lower leg: No edema.  Skin:    General: Skin is warm and dry.  Neurological:     General: No focal deficit present.     Mental Status: She is alert and oriented to person, place, and time.  Psychiatric:        Mood and Affect: Mood normal.        Behavior: Behavior normal.      No results found for any visits on 06/17/23.    The  10-year ASCVD risk score (Arnett DK, et al., 2019) is: 0.2%    Assessment & Plan:   Stephanie Dyer" was seen today for er follow up.  Diagnoses and all orders for this visit:  Left flank pain Reviewed ER note, CT imaging, UA. Negative UA again today. Discussed MSK etiology. Discussed symptomatic care. Norco prn for moderate to severe pain since tramadol has been of benefit. Do not take with tramadol or with other tylenol products. PDMP reviewed, no red flags. Return to office for new or worsening symptoms, or if symptoms persist. Discussed when to seek emergency care.  -     Urinalysis, Routine w reflex microscopic -     Urine Culture -     HYDROcodone-acetaminophen (NORCO) 7.5-325 MG tablet; Take 1 tablet by mouth  every 8 (eight) hours as needed for moderate pain (pain score 4-6).  Hepatomegaly Has upcoming appt with GI. No iron overload on last iron panel- has hx of iron deficiency. Will check labs as below. Asymptomatic. No drug or alcohol use now but does have hx of alcohol abuse.  -     CBC with Differential/Platelet -     BMP8+EGFR -     Hepatic Function Panel -     Acute Hep Panel & Hep B Surface Ab -     CoaguChek XS/INR Waived -     HIV antibody (with reflex) -     Copper, Serum    Return if symptoms worsen or fail to improve.  The patient indicates understanding of these issues and agrees with the plan.    Gabriel Earing, FNP

## 2023-06-17 NOTE — Telephone Encounter (Signed)
 LMTCB to schedule appt for hospital f/u and to ask to speak to someone in the office to schedule this appt Copied from CRM [id number]. Topic: Appointments - Scheduling Inquiry for Clinic >> Jun 17, 2023 10:45 AM Elle L wrote: Reason for CRM: The patient was discharged from the hospital this morning but I did not have a hospital follow-up within 14 days. The patient's call back number is [phone]. >> Jun 17, 2023 10:57 AM Mitzi M wrote: Jamal Maes to call patient to make hospital f/u appt and left message to call back.

## 2023-06-19 LAB — URINE CULTURE

## 2023-06-20 ENCOUNTER — Encounter: Payer: Self-pay | Admitting: Family Medicine

## 2023-06-20 ENCOUNTER — Other Ambulatory Visit: Payer: Self-pay | Admitting: Family Medicine

## 2023-06-20 DIAGNOSIS — H938X3 Other specified disorders of ear, bilateral: Secondary | ICD-10-CM

## 2023-06-20 DIAGNOSIS — Z9109 Other allergy status, other than to drugs and biological substances: Secondary | ICD-10-CM

## 2023-06-22 LAB — CBC WITH DIFFERENTIAL/PLATELET
Basophils Absolute: 0 10*3/uL (ref 0.0–0.2)
Basos: 1 %
EOS (ABSOLUTE): 0.2 10*3/uL (ref 0.0–0.4)
Eos: 4 %
Hematocrit: 43.7 % (ref 34.0–46.6)
Hemoglobin: 14.5 g/dL (ref 11.1–15.9)
Immature Grans (Abs): 0 10*3/uL (ref 0.0–0.1)
Immature Granulocytes: 0 %
Lymphocytes Absolute: 2.1 10*3/uL (ref 0.7–3.1)
Lymphs: 36 %
MCH: 29.9 pg (ref 26.6–33.0)
MCHC: 33.2 g/dL (ref 31.5–35.7)
MCV: 90 fL (ref 79–97)
Monocytes Absolute: 0.6 10*3/uL (ref 0.1–0.9)
Monocytes: 11 %
Neutrophils Absolute: 2.7 10*3/uL (ref 1.4–7.0)
Neutrophils: 48 %
Platelets: 234 10*3/uL (ref 150–450)
RBC: 4.85 x10E6/uL (ref 3.77–5.28)
RDW: 13.1 % (ref 11.7–15.4)
WBC: 5.6 10*3/uL (ref 3.4–10.8)

## 2023-06-22 LAB — ACUTE HEP PANEL AND HEP B SURFACE AB
Hep A IgM: NEGATIVE
Hep B C IgM: NEGATIVE
Hep C Virus Ab: NONREACTIVE
Hepatitis B Surf Ab Quant: 455 m[IU]/mL
Hepatitis B Surface Ag: NEGATIVE

## 2023-06-22 LAB — HEPATIC FUNCTION PANEL
ALT: 15 IU/L (ref 0–32)
AST: 19 IU/L (ref 0–40)
Albumin: 4.1 g/dL (ref 3.9–4.9)
Alkaline Phosphatase: 119 IU/L (ref 44–121)
Bilirubin Total: 0.8 mg/dL (ref 0.0–1.2)
Bilirubin, Direct: 0.26 mg/dL (ref 0.00–0.40)
Total Protein: 6.2 g/dL (ref 6.0–8.5)

## 2023-06-22 LAB — HIV ANTIBODY (ROUTINE TESTING W REFLEX): HIV Screen 4th Generation wRfx: NONREACTIVE

## 2023-06-22 LAB — COPPER, SERUM: Copper: 75 ug/dL — ABNORMAL LOW (ref 80–158)

## 2023-06-22 LAB — BMP8+EGFR
BUN/Creatinine Ratio: 15 (ref 9–23)
BUN: 12 mg/dL (ref 6–24)
CO2: 21 mmol/L (ref 20–29)
Calcium: 8.7 mg/dL (ref 8.7–10.2)
Chloride: 109 mmol/L — ABNORMAL HIGH (ref 96–106)
Creatinine, Ser: 0.8 mg/dL (ref 0.57–1.00)
Glucose: 51 mg/dL — ABNORMAL LOW (ref 70–99)
Potassium: 4.1 mmol/L (ref 3.5–5.2)
Sodium: 143 mmol/L (ref 134–144)
eGFR: 93 mL/min/{1.73_m2} (ref >59)

## 2023-06-23 ENCOUNTER — Encounter: Payer: Self-pay | Admitting: Family Medicine

## 2023-06-27 ENCOUNTER — Ambulatory Visit: Admitting: Gastroenterology

## 2023-06-27 NOTE — Progress Notes (Deleted)
 GI Office Note    Referring Provider: Albertha Huger, FNP Primary Care Physician:  Albertha Huger, FNP  Primary Gastroenterologist: Rolando Cliche. Mordechai April, DO   Chief Complaint   No chief complaint on file.    History of Present Illness   Stephanie Dyer is a 48 y.o. female presenting today at the request of EDP, Dorothey Gate PA-C for hiatal hernia. Seen last in 2021 with abnormal LFTs, chronic reflux, change in bowels (diarrhea), dysphagia.   Prior liver biopsy [facility]. ***  Seen in ED earlier this month for severe left flank pain. H/o UTIs/kidney stones, referred back pain.   CT renal stone study 06/16/2023: IMPRESSION: 1. Stable small hiatal hernia in a patient with gastric sleeve surgical changes. 2. Hepatomegaly. 3. Nonspecific hyperdense liver-could be related to drug toxicity or hemochromatosis.***    Medications   Current Outpatient Medications  Medication Sig Dispense Refill   albuterol  (2.5 MG/3ML) 0.083% NEBU 3 mL, albuterol  (5 MG/ML) 0.5% NEBU 0.5 mL Inhale into the lungs.     albuterol  (VENTOLIN  HFA) 108 (90 Base) MCG/ACT inhaler Inhale 2 puffs into the lungs every 6 (six) hours as needed for wheezing or shortness of breath. **NEEDS TO BE SEEN BEFORE NEXT REFILL** 6.7 g 0   B-D 3CC LUER-LOK SYR 25GX1" 25G X 1" 3 ML MISC Use for B12 injections every 2 weeks 24 each 0   Cholecalciferol (VITAMIN D -3) 125 MCG (5000 UT) TABS Take 1 tablet by mouth 2 (two) times daily.     Continuous Glucose Sensor (FREESTYLE LIBRE 3 PLUS SENSOR) MISC Use to monitor glucose continuously as directed. Change sensor every 15 days. 2 each 2   EPINEPHrine  0.3 mg/0.3 mL IJ SOAJ injection INJECT 0.3MG  INTRAMUSCULARLY AS NEEDED 2 each 0   FLUoxetine  (PROZAC ) 20 MG capsule Take 1 capsule (20 mg total) by mouth daily. 90 capsule 3   fluticasone  (FLONASE ) 50 MCG/ACT nasal spray INSTILL 2 SPRAYS IN EACH NOSTRIL DAILY 16 g 11   HYDROcodone -acetaminophen  (NORCO)  7.5-325 MG tablet Take 1 tablet by mouth every 8 (eight) hours as needed for moderate pain (pain score 4-6). 20 tablet 0   Needles & Syringes MISC Injection supplies for every 3-week vitamin B12 injections x 6 months with 1 refill. 6 Units 1   pantoprazole  (PROTONIX ) 40 MG tablet Take 1 tablet (40 mg total) by mouth daily. 90 tablet 3   topiramate  (TOPAMAX ) 50 MG tablet TAKE 1 AND 1/2 TABLETS (75MG  TOTAL) BY MOUTH DAILY AT 9AM 135 tablet 6   No current facility-administered medications for this visit.    Allergies   Allergies as of 06/27/2023 - Review Complete 06/17/2023  Allergen Reaction Noted   Codeine Anaphylaxis and Other (See Comments) 10/06/2019   Morphine Anaphylaxis 10/06/2019   Other Anaphylaxis 10/06/2019   Banana  10/06/2022   Fioricet [butalbital-apap-caffeine] Other (See Comments) 03/28/2020   Nsaids Other (See Comments) and Nausea And Vomiting 03/24/2020   Vancomycin Itching and Other (See Comments) 03/28/2020   Venlafaxine Other (See Comments) 03/28/2020   Watermelon flavoring agent (non-screening)  10/06/2022   Zolpidem Other (See Comments) 03/28/2020   Latex Rash 03/28/2020   Peanut -containing drug products Rash 06/22/2021   Shellfish allergy Swelling and Rash 03/28/2020    Past Medical History   Past Medical History:  Diagnosis Date   Angio-edema    Ankylosing spondylitis (HCC)    Anxiety    Arthritis    Asthma    Cervical cancer (HCC) 1998  Elevated LFTs    Environmental allergies    GERD (gastroesophageal reflux disease)    History of gastric bypass    History of iron deficiency anemia    Hypoglycemia    Kidney stones    Lumbar back pain    Migraines    PCOS (polycystic ovarian syndrome)    Pseudotumor cerebri 2006   Thyroid  cyst    Urticaria    Vitamin B12 deficiency 03/31/2020   Vitamin D  deficiency     Past Surgical History   Past Surgical History:  Procedure Laterality Date   ADENOIDECTOMY     CESAREAN SECTION  2016    CHOLECYSTECTOMY  1998   COSMETIC SURGERY  2015   GASTRIC BYPASS  2014   KNEE SURGERY Left 2001   LITHOTRIPSY  2021   TONSILLECTOMY     TOTAL ABDOMINAL HYSTERECTOMY  10/2018    Past Family History   Family History  Problem Relation Age of Onset   Alcohol abuse Mother    Diabetes Mother    COPD Mother    Emphysema Mother    Asthma Mother    Hypertension Father    Heart disease Father    Heart attack Father    CVA Brother    Diabetes Brother    Hypertension Brother    Diabetes Maternal Grandmother    Parkinson's disease Maternal Grandmother    Macular degeneration Maternal Grandmother    Thyroid  cancer Maternal Grandmother    Stomach cancer Maternal Grandmother    Cervical cancer Maternal Grandmother    Migraines Maternal Grandmother    Suicidality Maternal Grandfather    Autism Son    Hearing loss Son    Allergic rhinitis Neg Hx    Eczema Neg Hx    Urticaria Neg Hx    Breast cancer Neg Hx     Past Social History   Social History   Socioeconomic History   Marital status: Single    Spouse name: Not on file   Number of children: 2   Years of education: Not on file   Highest education level: GED or equivalent  Occupational History   Not on file  Tobacco Use   Smoking status: Former    Current packs/day: 0.00    Types: Cigarettes    Quit date: 11/27/2012    Years since quitting: 10.5   Smokeless tobacco: Never  Vaping Use   Vaping status: Every Day  Substance and Sexual Activity   Alcohol use: Not Currently   Drug use: Not Currently    Types: Marijuana   Sexual activity: Yes    Birth control/protection: Other-see comments  Other Topics Concern   Not on file  Social History Narrative   Makayla- 61, Alex- 7   Social Drivers of Corporate investment banker Strain: Medium Risk (04/12/2023)   Overall Financial Resource Strain (CARDIA)    Difficulty of Paying Living Expenses: Somewhat hard  Food Insecurity: Food Insecurity Present (04/12/2023)   Hunger Vital  Sign    Worried About Running Out of Food in the Last Year: Sometimes true    Ran Out of Food in the Last Year: Never true  Transportation Needs: No Transportation Needs (04/12/2023)   PRAPARE - Administrator, Civil Service (Medical): No    Lack of Transportation (Non-Medical): No  Physical Activity: Sufficiently Active (04/12/2023)   Exercise Vital Sign    Days of Exercise per Week: 7 days    Minutes of Exercise per Session: 30 min  Stress: Stress Concern Present (04/12/2023)   [employer] of Occupational Health - Occupational Stress Questionnaire    Feeling of Stress : To some extent  Social Connections: Moderately Integrated (04/12/2023)   Social Connection and Isolation Panel [NHANES]    Frequency of Communication with Friends and Family: More than three times a week    Frequency of Social Gatherings with Friends and Family: Once a week    Attends Religious Services: More than 4 times per year    Active Member of [employer] or Organizations: Yes    Attends Engineer, structural: More than 4 times per year    Marital Status: Divorced  Intimate Partner Violence: Not At Risk (07/23/2022)   Humiliation, Afraid, Rape, and Kick questionnaire    Fear of Current or Ex-Partner: No    Emotionally Abused: No    Physically Abused: No    Sexually Abused: No    Review of Systems   General: Negative for anorexia, weight loss, fever, chills, fatigue, weakness. Eyes: Negative for vision changes.  ENT: Negative for hoarseness, difficulty swallowing , nasal congestion. CV: Negative for chest pain, angina, palpitations, dyspnea on exertion, peripheral edema.  Respiratory: Negative for dyspnea at rest, dyspnea on exertion, cough, sputum, wheezing.  GI: See history of present illness. GU:  Negative for dysuria, hematuria, urinary incontinence, urinary frequency, nocturnal urination.  MS: Negative for joint pain, low back pain.  Derm: Negative for rash or itching.  Neuro: Negative  for weakness, abnormal sensation, seizure, frequent headaches, memory loss,  confusion.  Psych: Negative for anxiety, depression, suicidal ideation, hallucinations.  Endo: Negative for unusual weight change.  Heme: Negative for bruising or bleeding. Allergy: Negative for rash or hives.  Physical Exam   LMP 11/04/2018    General: Well-nourished, well-developed in no acute distress.  Head: Normocephalic, atraumatic.   Eyes: Conjunctiva pink, no icterus. Mouth: Oropharyngeal mucosa moist and pink , no lesions erythema or exudate. Neck: Supple without thyromegaly, masses, or lymphadenopathy.  Lungs: Clear to auscultation bilaterally.  Heart: Regular rate and rhythm, no murmurs rubs or gallops.  Abdomen: Bowel sounds are normal, nontender, nondistended, no hepatosplenomegaly or masses,  no abdominal bruits or hernia, no rebound or guarding.   Rectal: *** Extremities: No lower extremity edema. No clubbing or deformities.  Neuro: Alert and oriented x 4 , grossly normal neurologically.  Skin: Warm and dry, no rash or jaundice.   Psych: Alert and cooperative, normal mood and affect.  Labs   Lab Results  Component Value Date   NA 143 06/17/2023   CL 109 (H) 06/17/2023   K 4.1 06/17/2023   CO2 21 06/17/2023   BUN 12 06/17/2023   CREATININE 0.80 06/17/2023   EGFR 93 06/17/2023   CALCIUM 8.7 06/17/2023   ALBUMIN 4.1 06/17/2023   GLUCOSE 51 (L) 06/17/2023   Lab Results  Component Value Date   ALT 15 06/17/2023   AST 19 06/17/2023   ALKPHOS 119 06/17/2023   BILITOT 0.8 06/17/2023   Lab Results  Component Value Date   WBC 5.6 06/17/2023   HGB 14.5 06/17/2023   HCT 43.7 06/17/2023   MCV 90 06/17/2023   PLT 234 06/17/2023   ***copper  ***labs Imaging Studies   CT Renal Stone Study Result Date: 06/16/2023 CLINICAL DATA:  Abdominal/flank pain, stone suspected Pt c/o L flank pain 7/10 throbbing especially with urination, states Hx of kidney stones and thinks she may have a UTI  or kidney stone EXAM: CT ABDOMEN AND PELVIS WITHOUT CONTRAST TECHNIQUE:  Multidetector CT imaging of the abdomen and pelvis was performed following the standard protocol without IV contrast. RADIATION DOSE REDUCTION: This exam was performed according to the departmental dose-optimization program which includes automated exposure control, adjustment of the mA and/or kV according to patient size and/or use of iterative reconstruction technique. COMPARISON:  CT abdomen pelvis 08/12/2020 FINDINGS: Lower chest: Left base atelectasis.  Stable small hiatal hernia. Hepatobiliary: Hyperdense liver. Enlarged liver measuring up to 19 cm. No focal liver abnormality. Status post cholecystectomy. No biliary dilatation. Pancreas: No focal lesion. Normal pancreatic contour. No surrounding inflammatory changes. No main pancreatic ductal dilatation. Spleen: Normal in size without focal abnormality. Adrenals/Urinary Tract: No adrenal nodule bilaterally. No nephrolithiasis and no hydronephrosis. 1.3 cm fluid density lesion along the inferior pole of the right kidney likely represents a simple renal cyst. Simple renal cysts, in the absence of clinically indicated signs/symptoms, require no independent follow-up. No ureterolithiasis or hydroureter. The urinary bladder is unremarkable. Stomach/Bowel: Gastric sleeve surgical changes noted. Stomach is within normal limits. No evidence of bowel wall thickening or dilatation. Appendix appears normal. Vascular/Lymphatic: No abdominal aorta or iliac aneurysm. No abdominal, pelvic, or inguinal lymphadenopathy. Reproductive: Status post hysterectomy. No adnexal masses. Other: No intraperitoneal free fluid. No intraperitoneal free gas. No organized fluid collection. Musculoskeletal: No abdominal wall hernia or abnormality. No suspicious lytic or blastic osseous lesions. No acute displaced fracture. Chronic appearing T11-T12 vertebral body height loss. IMPRESSION: 1. Stable small hiatal hernia in a  patient with gastric sleeve surgical changes. 2. Hepatomegaly. 3. Nonspecific hyperdense liver-could be related to drug toxicity or hemochromatosis. Electronically Signed   By: Morgane  Naveau M.D.   On: 06/16/2023 20:18    Assessment       PLAN   ***   Trudie Fuse. Harles Lied, MHS, PA-C [facility]

## 2023-07-05 DIAGNOSIS — Z4889 Encounter for other specified surgical aftercare: Secondary | ICD-10-CM | POA: Diagnosis not present

## 2023-07-26 ENCOUNTER — Ambulatory Visit (INDEPENDENT_AMBULATORY_CARE_PROVIDER_SITE_OTHER): Payer: 59

## 2023-07-26 VITALS — BP 92/57 | HR 70 | Ht 63.0 in | Wt 182.0 lb

## 2023-07-26 DIAGNOSIS — Z1231 Encounter for screening mammogram for malignant neoplasm of breast: Secondary | ICD-10-CM

## 2023-07-26 DIAGNOSIS — Z0000 Encounter for general adult medical examination without abnormal findings: Secondary | ICD-10-CM | POA: Diagnosis not present

## 2023-07-26 NOTE — Progress Notes (Signed)
 Subjective:   Stephanie Dyer is a 48 y.o. who presents for a Medicare Wellness preventive visit.  As a reminder, Annual Wellness Visits don't include a physical exam, and some assessments may be limited, especially if this visit is performed virtually. We may recommend an in-person follow-up visit with your provider if needed.  Visit Complete: Virtual I connected with  Stephanie Dyer on 07/26/23 by a audio enabled telemedicine application and verified that I am speaking with the correct person using two identifiers.  Patient Location: Home  Provider Location: Home Office  I discussed the limitations of evaluation and management by telemedicine. The patient expressed understanding and agreed to proceed.  Vital Signs: Because this visit was a virtual/telehealth visit, some criteria may be missing or patient reported. Any vitals not documented were not able to be obtained and vitals that have been documented are patient reported.  VideoDeclined- This patient declined Librarian, academic. Therefore the visit was completed with audio only.  Persons Participating in Visit: Patient.  AWV Questionnaire: No: Patient Medicare AWV questionnaire was not completed prior to this visit.  Cardiac Risk Factors include: advanced age (>60men, >42 women);diabetes mellitus;obesity (BMI >30kg/m2)     Objective:     Today's Vitals   07/26/23 1112  BP: (!) 92/57  Pulse: 70  Weight: 182 lb (82.6 kg)  Height: 5\' 3"  (1.6 m)   Body mass index is 32.24 kg/m.     07/26/2023   11:03 AM 06/16/2023    3:34 PM 01/31/2023    8:42 AM 09/08/2022   10:49 AM 07/30/2022    9:08 AM 07/23/2022    2:10 PM 01/19/2022    9:01 AM  Advanced Directives  Does Patient Have a Medical Advance Directive? Yes No Yes Yes Yes Yes No  Type of Advance Directive Living will;Healthcare Power of [employer] Power of [place];Living will Healthcare Power of [place];Living will  Healthcare Power of [place];Living will Healthcare Power of [place];Living will   Does patient want to make changes to medical advance directive?    No - Patient declined No - Patient declined    Copy of Healthcare Power of Attorney in Chart? No - copy requested  No - copy requested No - copy requested No - copy requested No - copy requested   Would patient like information on creating a medical advance directive?  No - Patient declined No - Patient declined No - Patient declined   No - Patient declined    Current Medications (verified) Outpatient Encounter Medications as of 07/26/2023  Medication Sig   albuterol  (2.5 MG/3ML) 0.083% NEBU 3 mL, albuterol  (5 MG/ML) 0.5% NEBU 0.5 mL Inhale into the lungs.   albuterol  (VENTOLIN  HFA) 108 (90 Base) MCG/ACT inhaler Inhale 2 puffs into the lungs every 6 (six) hours as needed for wheezing or shortness of breath. **NEEDS TO BE SEEN BEFORE NEXT REFILL**   B-D 3CC LUER-LOK SYR 25GX1" 25G X 1" 3 ML MISC Use for B12 injections every 2 weeks   Cholecalciferol (VITAMIN D -3) 125 MCG (5000 UT) TABS Take 1 tablet by mouth 2 (two) times daily.   Continuous Glucose Sensor (FREESTYLE LIBRE 3 PLUS SENSOR) MISC Use to monitor glucose continuously as directed. Change sensor every 15 days.   EPINEPHrine  0.3 mg/0.3 mL IJ SOAJ injection INJECT 0.3MG  INTRAMUSCULARLY AS NEEDED   FLUoxetine  (PROZAC ) 20 MG capsule Take 1 capsule (20 mg total) by mouth daily.   fluticasone  (FLONASE ) 50 MCG/ACT nasal spray INSTILL 2 SPRAYS  IN EACH NOSTRIL DAILY   HYDROcodone -acetaminophen  (NORCO) 7.5-325 MG tablet Take 1 tablet by mouth every 8 (eight) hours as needed for moderate pain (pain score 4-6).   Needles & Syringes MISC Injection supplies for every 3-week vitamin B12 injections x 6 months with 1 refill.   pantoprazole  (PROTONIX ) 40 MG tablet Take 1 tablet (40 mg total) by mouth daily.   topiramate  (TOPAMAX ) 50 MG tablet TAKE 1 AND 1/2 TABLETS (75MG  TOTAL) BY MOUTH DAILY AT 9AM   No  facility-administered encounter medications on file as of 07/26/2023.    Allergies (verified) Codeine, Morphine, Other, Banana, Fioricet [butalbital-apap-caffeine], Nsaids, Vancomycin, Venlafaxine, Watermelon flavoring agent (non-screening), Zolpidem, Latex, Peanut -containing drug products, and Shellfish allergy   History: Past Medical History:  Diagnosis Date   Angio-edema    Ankylosing spondylitis (HCC)    Anxiety    Arthritis    Asthma    Cervical cancer (HCC) 1998   Elevated LFTs    Environmental allergies    GERD (gastroesophageal reflux disease)    History of gastric bypass    History of iron deficiency anemia    Hypoglycemia    Kidney stones    Lumbar back pain    Migraines    PCOS (polycystic ovarian syndrome)    Pseudotumor cerebri 2006   Thyroid  cyst    Urticaria    Vitamin B12 deficiency 03/31/2020   Vitamin D  deficiency    Past Surgical History:  Procedure Laterality Date   ADENOIDECTOMY     CESAREAN SECTION  2016   CHOLECYSTECTOMY  1998   COSMETIC SURGERY  2015   GASTRIC BYPASS  2014   KNEE SURGERY Left 2001   LITHOTRIPSY  2021   TONSILLECTOMY     TOTAL ABDOMINAL HYSTERECTOMY  10/2018   Family History  Problem Relation Age of Onset   Alcohol abuse Mother    Diabetes Mother    COPD Mother    Emphysema Mother    Asthma Mother    Hypertension Father    Heart disease Father    Heart attack Father    CVA Brother    Diabetes Brother    Hypertension Brother    Diabetes Maternal Grandmother    Parkinson's disease Maternal Grandmother    Macular degeneration Maternal Grandmother    Thyroid  cancer Maternal Grandmother    Stomach cancer Maternal Grandmother    Cervical cancer Maternal Grandmother    Migraines Maternal Grandmother    Suicidality Maternal Grandfather    Autism Son    Hearing loss Son    Allergic rhinitis Neg Hx    Eczema Neg Hx    Urticaria Neg Hx    Breast cancer Neg Hx    Social History   Socioeconomic History   Marital  status: Single    Spouse name: Not on file   Number of children: 2   Years of education: Not on file   Highest education level: GED or equivalent  Occupational History   Not on file  Tobacco Use   Smoking status: Former    Current packs/day: 0.00    Types: Cigarettes    Quit date: 11/27/2012    Years since quitting: 10.6   Smokeless tobacco: Never  Vaping Use   Vaping status: Every Day  Substance and Sexual Activity   Alcohol use: Not Currently   Drug use: Not Currently    Types: Marijuana   Sexual activity: Yes    Birth control/protection: Other-see comments  Other Topics Concern   Not on  file  Social History Narrative   Makayla- 26, Alex- 7   Social Drivers of Corporate investment banker Strain: Medium Risk (07/26/2023)   Overall Financial Resource Strain (CARDIA)    Difficulty of Paying Living Expenses: Somewhat hard  Food Insecurity: Food Insecurity Present (07/26/2023)   Hunger Vital Sign    Worried About Running Out of Food in the Last Year: Sometimes true    Ran Out of Food in the Last Year: Sometimes true  Transportation Needs: No Transportation Needs (07/26/2023)   PRAPARE - Administrator, Civil Service (Medical): No    Lack of Transportation (Non-Medical): No  Physical Activity: Inactive (07/26/2023)   Exercise Vital Sign    Days of Exercise per Week: 0 days    Minutes of Exercise per Session: 0 min  Stress: Stress Concern Present (07/26/2023)   [employer] of Occupational Health - Occupational Stress Questionnaire    Feeling of Stress : Very much  Social Connections: Moderately Integrated (07/26/2023)   Social Connection and Isolation Panel [NHANES]    Frequency of Communication with Friends and Family: More than three times a week    Frequency of Social Gatherings with Friends and Family: More than three times a week    Attends Religious Services: More than 4 times per year    Active Member of [employer] or Organizations: Yes    Attends Museum/gallery exhibitions officer: More than 4 times per year    Marital Status: Divorced    Tobacco Counseling Counseling given: Yes    Clinical Intake:  Pre-visit preparation completed: Yes  Pain : No/denies pain     BMI - recorded: 32.24 Nutritional Status: BMI > 30  Obese Nutritional Risks: None Diabetes: Yes CBG done?: Yes (54)  Lab Results  Component Value Date   HGBA1C 5.0 05/20/2022     How often do you need to have someone help you when you read instructions, pamphlets, or other written materials from your doctor or pharmacy?: 1 - Never  Interpreter Needed?: No  Information entered by :: Alia t/cma   Activities of Daily Living     07/26/2023   11:00 AM  In your present state of health, do you have any difficulty performing the following activities:  Hearing? 0  Vision? 0  Difficulty concentrating or making decisions? 1  Comment dif w/concentrating & remembering  Walking or climbing stairs? 1  Comment occassionally/pt uses nothing for support  Dressing or bathing? 0  Doing errands, shopping? 0  Preparing Food and eating ? N  Using the Toilet? N  In the past six months, have you accidently leaked urine? Y  Do you have problems with loss of bowel control? Y  Managing your Medications? N  Managing your Finances? N  Housekeeping or managing your Housekeeping? N    Patient Care Team: Albertha Huger, FNP as PCP - General (Family Medicine) Vinetta Greening, DO as Consulting Physician (Internal Medicine)  Indicate any recent Medical Services you may have received from other than [facility] providers in the past year (date may be approximate).     Assessment:    This is a routine wellness examination for [place].  Hearing/Vision screen Hearing Screening - Comments:: Pt stated yes, don't use hearing aids/not comfortable Vision Screening - Comments:: pt wear glasses/pt goes to [employer] [facility] [place]   Goals Addressed             This Visit's  Progress    Patient Stated  Per pt finish school       Depression Screen     07/26/2023   11:09 AM 06/17/2023    2:55 PM 04/19/2023    3:11 PM 04/08/2023   10:46 AM 11/29/2022    9:27 AM 10/26/2022    3:17 PM 10/26/2022    3:07 PM  PHQ 2/9 Scores  PHQ - 2 Score 0 0 0 0 0 2 0  PHQ- 9 Score 12 0 0 0 5 14     Fall Risk     07/26/2023   10:59 AM 06/17/2023    2:55 PM 04/08/2023   10:47 AM 11/29/2022    9:28 AM 10/26/2022    3:07 PM  Fall Risk   Falls in the past year? 0 0 0 0 0  Number falls in past yr: 0   0   Injury with Fall? 0   0   Risk for fall due to : No Fall Risks   No Fall Risks   Follow up Falls prevention discussed;Falls evaluation completed   Falls evaluation completed     MEDICARE RISK AT HOME:  Medicare Risk at Home Any stairs in or around the home?: Yes If so, are there any without handrails?: No Home free of loose throw rugs in walkways, pet beds, electrical cords, etc?: Yes Adequate lighting in your home to reduce risk of falls?: Yes Life alert?: No Use of a cane, walker or w/c?: No Grab bars in the bathroom?: Yes (per pt in the shower) Shower chair or bench in shower?: No Elevated toilet seat or a handicapped toilet?: Yes  TIMED UP AND GO:  Was the test performed?  no  Cognitive Function: 6CIT completed        07/26/2023   11:11 AM 07/23/2022    2:11 PM 10/01/2020   10:03 AM  6CIT Screen  What Year? 0 points 0 points 0 points  What month? 0 points 0 points 0 points  What time? 0 points 0 points 0 points  Count back from 20 0 points 0 points 0 points  Months in reverse 0 points 0 points 0 points  Repeat phrase 0 points 0 points 0 points  Total Score 0 points 0 points 0 points    Immunizations Immunization History  Administered Date(s) Administered   Influenza, Seasonal, Injecte, Preservative Fre 04/08/2023   Influenza-Unspecified 01/05/2021   MMRV 04/28/2023   Tdap 03/29/2015    Screening Tests Health Maintenance  Topic Date Due    MAMMOGRAM  03/04/2023   Pneumococcal Vaccine 57-15 Years old (1 of 2 - PCV) 04/07/2024 (Originally 07/23/1996)   Colonoscopy  04/07/2024 (Originally 07/24/2022)   COVID-19 Vaccine (1 - 2024-25 season) 04/23/2024 (Originally 11/07/2022)   INFLUENZA VACCINE  10/07/2023   Medicare Annual Wellness (AWV)  07/25/2024   DTaP/Tdap/Td (2 - Td or Tdap) 03/28/2025   Hepatitis C Screening  Completed   HIV Screening  Completed   HPV VACCINES  Aged Out   Meningococcal B Vaccine  Aged Out    Health Maintenance  Health Maintenance Due  Topic Date Due   MAMMOGRAM  03/04/2023   Health Maintenance Items Addressed: See Nurse Notes  Additional Screening:  Vision Screening: Recommended annual ophthalmology exams for early detection of glaucoma and other disorders of the eye.  Dental Screening: Recommended annual dental exams for proper oral hygiene  Community Resource Referral / Chronic Care Management: CRR required this visit?  No   CCM required this visit?  No   Plan:  I have personally reviewed and noted the following in the patient's chart:   Medical and social history Use of alcohol, tobacco or illicit drugs  Current medications and supplements including opioid prescriptions. Patient is not currently taking opioid prescriptions. Functional ability and status Nutritional status Physical activity Advanced directives List of other physicians Hospitalizations, surgeries, and ER visits in previous 12 months Vitals Screenings to include cognitive, depression, and falls Referrals and appointments  In addition, I have reviewed and discussed with patient certain preventive protocols, quality metrics, and best practice recommendations. A written personalized care plan for preventive services as well as general preventive health recommendations were provided to patient.   Michaelle Adolphus, CMA   07/26/2023   After Visit Summary: (MyChart) Due to this being a telephonic visit, the after visit  summary with patients personalized plan was offered to patient via MyChart   Notes: See routing msg. Pt is aware she is due for mammogram. Order has been placed

## 2023-07-26 NOTE — Patient Instructions (Signed)
 Stephanie Dyer , Thank you for taking time out of your busy schedule to complete your Annual Wellness Visit with me. I enjoyed our conversation and look forward to speaking with you again next year. I, as well as your care team,  appreciate your ongoing commitment to your health goals. Please review the following plan we discussed and let me know if I can assist you in the future. Your Game plan/ To Do List    Follow up Visits: Next Medicare AWV with our clinical staff: 07/26/24 at 8:00a.m.   Have you seen your provider in the last 6 months (3 months if uncontrolled diabetes)? No Next Office Visit with your provider: n/a  Clinician Recommendations:  Aim for 30 minutes of exercise or brisk walking, 6-8 glasses of water, and 5 servings of fruits and vegetables each day. Pt is aware that she due for a mammogram. If you have any questions please contact our office at [phone].       This is a list of the screening recommended for you and due dates:  Health Maintenance  Topic Date Due   Mammogram  03/04/2023   Pneumococcal Vaccination (1 of 2 - PCV) 04/07/2024*   Colon Cancer Screening  04/07/2024*   COVID-19 Vaccine (1 - 2024-25 season) 04/23/2024*   Flu Shot  10/07/2023   Medicare Annual Wellness Visit  07/25/2024   DTaP/Tdap/Td vaccine (2 - Td or Tdap) 03/28/2025   Hepatitis C Screening  Completed   HIV Screening  Completed   HPV Vaccine  Aged Out   Meningitis B Vaccine  Aged Out  *Topic was postponed. The date shown is not the original due date.    Advanced directives: (Copy Requested) Please bring a copy of your health care power of attorney and living will to the office to be added to your chart at your convenience. You can mail to [facility] [address] [place], [place] [address] or email to [email or web address] Advance Care Planning is important because it:  [x]  Makes sure you receive the medical care that is consistent with your values, goals,  and preferences  [x]  It provides guidance to your family and loved ones and reduces their decisional burden about whether or not they are making the right decisions based on your wishes.  Follow the link provided in your after visit summary or read over the paperwork we have mailed to you to help you started getting your Advance Directives in place. If you need assistance in completing these, please reach out to us  so that we can help you!  See attachments for Preventive Care and Fall Prevention Tips.

## 2023-07-27 ENCOUNTER — Inpatient Hospital Stay: Payer: Medicare Other | Attending: Hematology

## 2023-07-27 DIAGNOSIS — D509 Iron deficiency anemia, unspecified: Secondary | ICD-10-CM

## 2023-07-27 DIAGNOSIS — Z86711 Personal history of pulmonary embolism: Secondary | ICD-10-CM | POA: Insufficient documentation

## 2023-07-27 DIAGNOSIS — E538 Deficiency of other specified B group vitamins: Secondary | ICD-10-CM

## 2023-07-27 DIAGNOSIS — Z5320 Procedure and treatment not carried out because of patient's decision for unspecified reasons: Secondary | ICD-10-CM

## 2023-07-27 DIAGNOSIS — Z7901 Long term (current) use of anticoagulants: Secondary | ICD-10-CM | POA: Insufficient documentation

## 2023-07-27 DIAGNOSIS — E611 Iron deficiency: Secondary | ICD-10-CM | POA: Insufficient documentation

## 2023-07-27 DIAGNOSIS — E559 Vitamin D deficiency, unspecified: Secondary | ICD-10-CM | POA: Diagnosis not present

## 2023-07-27 DIAGNOSIS — I2699 Other pulmonary embolism without acute cor pulmonale: Secondary | ICD-10-CM

## 2023-07-27 DIAGNOSIS — Z86718 Personal history of other venous thrombosis and embolism: Secondary | ICD-10-CM | POA: Diagnosis not present

## 2023-07-27 LAB — CBC WITH DIFFERENTIAL/PLATELET
Abs Immature Granulocytes: 0.02 10*3/uL (ref 0.00–0.07)
Basophils Absolute: 0 10*3/uL (ref 0.0–0.1)
Basophils Relative: 1 %
Eosinophils Absolute: 0.2 10*3/uL (ref 0.0–0.5)
Eosinophils Relative: 4 %
HCT: 45.5 % (ref 36.0–46.0)
Hemoglobin: 14.9 g/dL (ref 12.0–15.0)
Immature Granulocytes: 0 %
Lymphocytes Relative: 34 %
Lymphs Abs: 1.6 10*3/uL (ref 0.7–4.0)
MCH: 29.3 pg (ref 26.0–34.0)
MCHC: 32.7 g/dL (ref 30.0–36.0)
MCV: 89.6 fL (ref 80.0–100.0)
Monocytes Absolute: 0.4 10*3/uL (ref 0.1–1.0)
Monocytes Relative: 8 %
Neutro Abs: 2.6 10*3/uL (ref 1.7–7.7)
Neutrophils Relative %: 53 %
Platelets: 226 10*3/uL (ref 150–400)
RBC: 5.08 MIL/uL (ref 3.87–5.11)
RDW: 13.5 % (ref 11.5–15.5)
WBC: 4.8 10*3/uL (ref 4.0–10.5)
nRBC: 0 % (ref 0.0–0.2)

## 2023-07-27 LAB — VITAMIN B12: Vitamin B-12: 195 pg/mL (ref 180–914)

## 2023-07-27 LAB — IRON AND TIBC
Iron: 157 ug/dL (ref 28–170)
Saturation Ratios: 40 % — ABNORMAL HIGH (ref 10.4–31.8)
TIBC: 390 ug/dL (ref 250–450)
UIBC: 233 ug/dL

## 2023-07-27 LAB — VITAMIN D 25 HYDROXY (VIT D DEFICIENCY, FRACTURES): Vit D, 25-Hydroxy: 23.02 ng/mL — ABNORMAL LOW (ref 30–100)

## 2023-07-27 LAB — FERRITIN: Ferritin: 104 ng/mL (ref 11–307)

## 2023-07-31 LAB — METHYLMALONIC ACID, SERUM: Methylmalonic Acid, Quantitative: 403 nmol/L — ABNORMAL HIGH (ref 0–378)

## 2023-08-02 NOTE — Progress Notes (Unsigned)
 VIRTUAL VISIT via TELEPHONE NOTE [facility]   I connected with Stephanie Dyer  on 08/03/2023 at 2:40 PM by telephone and verified that I am speaking with the correct person using two identifiers.  Location: Patient: Home Provider: [facility]   I discussed the limitations, risks, security and privacy concerns of performing an evaluation and management service by telephone and the availability of in person appointments. I also discussed with the patient that there may be a patient responsible charge related to this service. The patient expressed understanding and agreed to proceed.  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 female returns for routine follow-up of  iron deficiency and her history of DVT/PE x2 (refused chronic anticoagulation).  She was last evaluated via telemedicine visit by Sheril Dines PA-C on 01/31/2023.    Patient had previously decided to stop taking Xarelto  AGAINST MEDICAL ADVICE, but reports that she changed her mind and decided to restart Xarelto  in December 2025.    At today's visit, she reports feeling fair.   No symptoms of DVT such as unilateral leg swelling, pain, and erythema.   No symptoms of PE such as new shortness of breath, chest pain, cough, hemoptysis, or palpitations.   She denies any major bleeding events, epistaxis, melena, or hematochezia.    She denies any pica, lightheadedness, syncope, or abnormal headaches. She has ongoing baseline fatigue (energy around 25%), with appetite 75%.  She reports that her weight is overall stable. She has been taking vitamin D  2000 units 3 times daily. She is prescribed vitamin B12 injections to be administered every 2 weeks, but reports that she only remembers this about once a  month.  ASSESSMENT & PLAN:  1.  Recurrent PE, on chronic anticoagulation - Initial pulmonary embolism in 2008, provoked in the setting of hospitalization for bronchitis/pneumonia - Second pulmonary embolism in June 2022 was unprovoked.  Diagnosed in ED at [facility] after patient presented with complaints of left-sided chest pain and LLE pain - CTA chest (08/16/2020) showed age-indeterminate subsegmental PE but no evidence of acute PE.  - Venous duplex ultrasound of bilateral lower extremities (08/29/2020): Negative for DVT - Patient reports a family history of blood clots, but no family members with definitive diagnosis of hypercoagulable disorder - Hypercoagulable work-up (08/29/2020) was unremarkable  - She is not on any oral birth control - At visit in July 2024, patient made the informed decision to stop taking Xarelto  AGAINST MEDICAL ADVICE, but reports that she changed her mind and decided to restart Xarelto  in December 2025.   - No signs or symptoms of recurrent DVT/PE - PLAN: Due to history of recurrent DVT/PE with at least 1 unprovoked episode, continue to recommend indefinite anticoagulation.  Continue Xarelto . - Continue annual risk-benefit analysis of ongoing anticoagulation.   2.   Iron deficiency state, without anemia - Patient had history of gastric bypass in 2011, has had issues with iron level since that time and has required intermittent IV iron infusions - Iron levels failed to improve with oral supplementation.  Suspect malabsorption secondary to gastric bypass surgery. - Most recent IV iron with Feraheme  x1 on 02/02/2023 - Most recent labs (07/27/2023): Hgb 14.9/MCV 89.6, ferritin 104, iron saturation 40%. - She has ongoing fatigue, but denies any signs or symptoms of major blood loss -  PLAN: No indication for IV iron at this time. -  Will check labs with OFFICE visit in 6 months.   3.  Other deficiencies - Vitamin B12 failed to improve on oral supplementation.    -Taking vitamin D  2000 units 3 times daily. -Prescribed vitamin B12 injections to be administered every 2 weeks, but reports that she only remembers this about once a month. - Most recent labs (07/27/2023): Vitamin B12 marginal at 195/elevated MMA 403.  Vitamin D  is low at 23.02.   - PLAN: Encouraged better compliance with vitamin B12 injections to be given once every 2 weeks.  (Patient would like to try sublingual supplementation.  She failed p.o. vitamin B12 in the past.  Recommend continuing injections for the time being, but if she is able to achieve normal MMA and vitamin B12 >500, would consider trial of higher dose sublingual B12 for maintenance.) - Rx to pharmacy for vitamin D  50,000 units weekly (rather than daily supplements) - We will recheck B12/MMA and vitamin D  in 6 months   PLAN SUMMARY:  >> Rx to pharmacy for vitamin D  50,000 units weekly >> Renewal Rx to pharmacy for B12 injections every 2 weeks >> Labs in 6 months = CBC/D, ferritin, iron/TIBC, B12, MMA, vitamin D  >> OFFICE visit 1 week after labs  **Last office visit 09/08/2022     REVIEW OF SYSTEMS:   Review of Systems  Constitutional:  Positive for malaise/fatigue. Negative for chills, diaphoresis, fever and weight loss.  Respiratory:  Negative for cough and shortness of breath.   Cardiovascular:  Negative for chest pain and palpitations.  Gastrointestinal:  Negative for abdominal pain, blood in stool, melena, nausea and vomiting.  Neurological:  Negative for dizziness and headaches.  Psychiatric/Behavioral:  Positive for depression. The patient is nervous/anxious.      PHYSICAL EXAM: (per limitations of virtual telephone visit)  The patient is alert and oriented x 3, exhibiting adequate mentation, good mood, and ability to speak in full sentences and execute sound judgement.  WRAP UP:   I discussed the assessment and treatment plan with the patient. The patient was provided an opportunity to ask questions and all  were answered. The patient agreed with the plan and demonstrated an understanding of the instructions.   The patient was advised to call back or seek an in-person evaluation if the symptoms worsen or if the condition fails to improve as anticipated.  I provided 22 minutes of non-face-to-face time during this encounter, including >10 minutes of medical discussion.  Sonnie Dusky, PA-C 08/03/23 3:05 PM

## 2023-08-03 ENCOUNTER — Inpatient Hospital Stay: Payer: Medicare Other | Admitting: Physician Assistant

## 2023-08-03 DIAGNOSIS — E611 Iron deficiency: Secondary | ICD-10-CM | POA: Diagnosis not present

## 2023-08-03 DIAGNOSIS — E559 Vitamin D deficiency, unspecified: Secondary | ICD-10-CM

## 2023-08-03 DIAGNOSIS — E538 Deficiency of other specified B group vitamins: Secondary | ICD-10-CM

## 2023-08-03 DIAGNOSIS — D509 Iron deficiency anemia, unspecified: Secondary | ICD-10-CM

## 2023-08-03 DIAGNOSIS — Z7901 Long term (current) use of anticoagulants: Secondary | ICD-10-CM | POA: Diagnosis not present

## 2023-08-03 DIAGNOSIS — I2699 Other pulmonary embolism without acute cor pulmonale: Secondary | ICD-10-CM | POA: Diagnosis not present

## 2023-08-03 DIAGNOSIS — Z5320 Procedure and treatment not carried out because of patient's decision for unspecified reasons: Secondary | ICD-10-CM | POA: Diagnosis not present

## 2023-08-03 MED ORDER — ERGOCALCIFEROL 1.25 MG (50000 UT) PO CAPS: 50000.0000 [IU] | ORAL_CAPSULE | ORAL | 3 refills | Status: DC

## 2023-08-03 MED ORDER — BD LUER-LOK SYRINGE 25G X 1" 3 ML MISC: 0 refills | Status: DC

## 2023-08-03 MED ORDER — CYANOCOBALAMIN 1000 MCG/ML IJ SOLN: 1000.0000 ug | INTRAMUSCULAR | 3 refills | Status: DC

## 2023-08-05 ENCOUNTER — Ambulatory Visit: Admitting: Family Medicine

## 2023-08-08 ENCOUNTER — Other Ambulatory Visit: Payer: Self-pay | Admitting: Hematology

## 2023-08-08 DIAGNOSIS — E538 Deficiency of other specified B group vitamins: Secondary | ICD-10-CM

## 2023-08-10 ENCOUNTER — Ambulatory Visit

## 2023-08-11 ENCOUNTER — Encounter: Payer: Self-pay | Admitting: Family Medicine

## 2023-08-11 ENCOUNTER — Ambulatory Visit: Admitting: Family Medicine

## 2023-08-11 VITALS — BP 106/69 | HR 71 | Temp 98.2°F | Ht 63.0 in | Wt 193.0 lb

## 2023-08-11 DIAGNOSIS — F411 Generalized anxiety disorder: Secondary | ICD-10-CM | POA: Diagnosis not present

## 2023-08-11 DIAGNOSIS — F603 Borderline personality disorder: Secondary | ICD-10-CM

## 2023-08-11 DIAGNOSIS — J301 Allergic rhinitis due to pollen: Secondary | ICD-10-CM

## 2023-08-11 DIAGNOSIS — F909 Attention-deficit hyperactivity disorder, unspecified type: Secondary | ICD-10-CM

## 2023-08-11 MED ORDER — FLUOXETINE HCL 40 MG PO CAPS: 40.0000 mg | ORAL_CAPSULE | Freq: Every day | ORAL | 3 refills | Status: AC

## 2023-08-11 MED ORDER — LEVOCETIRIZINE DIHYDROCHLORIDE 5 MG PO TABS: 5.0000 mg | ORAL_TABLET | Freq: Every evening | ORAL | 3 refills | Status: DC

## 2023-08-11 NOTE — Progress Notes (Signed)
 Acute Office Visit  Subjective:     Patient ID: Stephanie Dyer, female    DOB: 1975/04/22, 48 y.o.   MRN: [id number]  Chief Complaint  Patient presents with   ADHD    HPI Patient is in today for for ADHD. She was diagnosied as a child, about 74-70 years old.   Having difficulty concentrating, staying on task while studies for school. She will be doing 4-5 other tasks while studying. Her grades are good currently.    Right ear has been itchy for about 1 week. Mouth has also been itchy. Taking flonase  and claritin   Increased depression and anxiety symptoms for last week. Crying, irritable. Denies SI. Complaint with prozac  20 mg daily.     07/26/2023   11:09 AM 06/17/2023    2:55 PM 04/19/2023    3:11 PM  Depression screen PHQ 2/9  Decreased Interest 0 0 0  Down, Depressed, Hopeless 0 0 0  PHQ - 2 Score 0 0 0  Altered sleeping 3 0 0  Tired, decreased energy 3 0 0  Change in appetite 0 0 0  Feeling bad or failure about yourself  0 0 0  Trouble concentrating 3 0 0  Moving slowly or fidgety/restless 3 0 0  Suicidal thoughts 0 0 0  PHQ-9 Score 12 0 0  Difficult doing work/chores Very difficult Not difficult at all Not difficult at all      06/17/2023    2:56 PM 04/19/2023    3:11 PM 04/08/2023   10:47 AM 11/29/2022    9:28 AM  GAD 7 : Generalized Anxiety Score  Nervous, Anxious, on Edge 0 0 0 1  Control/stop worrying 0 0 0 2  Worry too much - different things 0 0 0 2  Trouble relaxing 1 1 0 2  Restless 0 1 0 2  Easily annoyed or irritable 0 1 0 2  Afraid - awful might happen 0 0 0 0  Total GAD 7 Score 1 3 0 11  Anxiety Difficulty Not difficult at all Somewhat difficult Not difficult at all Not difficult at all     ROS As per HPI.      Objective:    BP 106/69   Pulse 71   Temp 98.2 F (36.8 C) (Temporal)   Ht 5\' 3"  (1.6 m)   Wt 193 lb (87.5 kg)   LMP 11/04/2018   SpO2 97%   BMI 34.19 kg/m    Physical Exam Vitals and nursing note reviewed.   Constitutional:      General: She is not in acute distress.    Appearance: She is not ill-appearing, toxic-appearing or diaphoretic.  HENT:     Right Ear: Tympanic membrane, ear canal and external ear normal.     Left Ear: Tympanic membrane, ear canal and external ear normal.     Nose: Nose normal.     Mouth/Throat:     Mouth: Mucous membranes are moist.     Pharynx: Oropharynx is clear. No oropharyngeal exudate or posterior oropharyngeal erythema.  Eyes:     General:        Right eye: No discharge.        Left eye: No discharge.     Conjunctiva/sclera: Conjunctivae normal.  Cardiovascular:     Rate and Rhythm: Normal rate and regular rhythm.     Heart sounds: Normal heart sounds. No murmur heard. Pulmonary:     Effort: Pulmonary effort is normal. No respiratory distress.  Breath sounds: Normal breath sounds. No wheezing.  Musculoskeletal:     Cervical back: Neck supple. No rigidity.     Right lower leg: No edema.     Left lower leg: No edema.  Skin:    General: Skin is warm and dry.  Neurological:     General: No focal deficit present.     Mental Status: She is alert and oriented to person, place, and time.  Psychiatric:        Mood and Affect: Mood normal.        Behavior: Behavior normal.     No results found for any visits on 08/11/23.      Assessment & Plan:   Stephanie Dyer" was seen today for adhd.  Diagnoses and all orders for this visit:  Attention deficit hyperactivity disorder (ADHD), unspecified ADHD type Dx as child. Struggling with concentration. Referral discussed and placed.  -     Ambulatory referral to Psychiatry  Generalized anxiety disorder Borderline personality disorder (HCC) Not well controlled. Denies SI. Increase prozac . Follow up in 6 weeks.  -     FLUoxetine  (PROZAC ) 40 MG capsule; Take 1 capsule (40 mg total) by mouth daily.  Seasonal allergic rhinitis due to pollen Switch from claritin  to xyzal .  -     levocetirizine (XYZAL ) 5  MG tablet; Take 1 tablet (5 mg total) by mouth every evening.     Return in about 6 weeks (around 09/22/2023) for medication follow up.  The patient indicates understanding of these issues and agrees with the plan.   Stephanie Huger, FNP

## 2023-08-15 ENCOUNTER — Ambulatory Visit
Admission: RE | Admit: 2023-08-15 | Discharge: 2023-08-15 | Disposition: A | Discharge status: Home / Self Care | Source: Ambulatory Visit | Attending: Family Medicine

## 2023-08-15 DIAGNOSIS — Z1231 Encounter for screening mammogram for malignant neoplasm of breast: Secondary | ICD-10-CM

## 2023-08-16 ENCOUNTER — Other Ambulatory Visit: Payer: Self-pay | Admitting: Physician Assistant

## 2023-08-16 DIAGNOSIS — E538 Deficiency of other specified B group vitamins: Secondary | ICD-10-CM

## 2023-08-17 ENCOUNTER — Other Ambulatory Visit: Payer: Self-pay | Admitting: Hematology

## 2023-08-17 DIAGNOSIS — E538 Deficiency of other specified B group vitamins: Secondary | ICD-10-CM

## 2023-08-18 ENCOUNTER — Other Ambulatory Visit: Payer: Self-pay | Admitting: Hematology

## 2023-08-18 ENCOUNTER — Other Ambulatory Visit: Payer: Self-pay | Admitting: Family Medicine

## 2023-08-18 DIAGNOSIS — J301 Allergic rhinitis due to pollen: Secondary | ICD-10-CM

## 2023-08-31 ENCOUNTER — Telehealth: Payer: Self-pay

## 2023-08-31 NOTE — Telephone Encounter (Signed)
 Referral sent to: [facility] [address] - [place] [address] [phone]  MyChart Message sent to Patient with Specialty Office contact information.

## 2023-08-31 NOTE — Telephone Encounter (Signed)
 Copied from CRM [id number]. Topic: Referral - Status >> Aug 31, 2023 10:31 AM Turkey B wrote: Reason for CRM: pt called in about psychiatry referral. Please cb as to where the pt is to go

## 2023-09-12 ENCOUNTER — Other Ambulatory Visit: Payer: Self-pay | Admitting: Family Medicine

## 2023-09-12 DIAGNOSIS — F411 Generalized anxiety disorder: Secondary | ICD-10-CM

## 2023-09-12 DIAGNOSIS — F603 Borderline personality disorder: Secondary | ICD-10-CM

## 2023-09-12 DIAGNOSIS — J301 Allergic rhinitis due to pollen: Secondary | ICD-10-CM

## 2023-09-22 ENCOUNTER — Encounter: Payer: Self-pay | Admitting: Family Medicine

## 2023-09-22 ENCOUNTER — Ambulatory Visit: Admitting: Family Medicine

## 2023-09-22 VITALS — BP 97/63 | HR 68 | Temp 97.9°F | Ht 63.0 in | Wt 193.0 lb

## 2023-09-22 DIAGNOSIS — F902 Attention-deficit hyperactivity disorder, combined type: Secondary | ICD-10-CM | POA: Insufficient documentation

## 2023-09-22 DIAGNOSIS — F411 Generalized anxiety disorder: Secondary | ICD-10-CM | POA: Diagnosis not present

## 2023-09-22 DIAGNOSIS — F909 Attention-deficit hyperactivity disorder, unspecified type: Secondary | ICD-10-CM

## 2023-09-22 DIAGNOSIS — F339 Major depressive disorder, recurrent, unspecified: Secondary | ICD-10-CM

## 2023-09-22 DIAGNOSIS — F603 Borderline personality disorder: Secondary | ICD-10-CM

## 2023-09-22 NOTE — Progress Notes (Signed)
 Established Patient Office Visit  Subjective   Patient ID: Stephanie Dyer, female    DOB: [birth year]  Age: 48 y.o. MRN: [id number]  Chief Complaint  Patient presents with   Medical Management of Chronic Issues    HPI Lorn is here for follow up after increasing prozac  to 40 mg daily. She has not noticed an improvement in symptoms. Her main symptoms are now fatigue, difficulty sleeping, and lack of focus. She was referred for treatment of ADHD but has not received a call regarding this. She denies side effects of prozac . Reports compliance. She has chronic insomnia. She has trouble falling and staying sleep. Gets a few hours of sleep a night at most. A good night of sleep is 6 hours. She listens to a [employer] to fall sleep. Keeps room cool, dark. Strict bedtime and wake up time. She gets out of bed if she isn't able to go back to sleep, sits on the couch and plays video games. She is on call a lot so isn't able to take medications to help with sleep as she needs to be able to get up and go if needed. She does take melatonin OTC.      09/22/2023    8:32 AM 08/11/2023    8:35 AM 07/26/2023   11:09 AM  Depression screen PHQ 2/9  Decreased Interest 0 1 0  Down, Depressed, Hopeless 0 2 0  PHQ - 2 Score 0 3 0  Altered sleeping 1 2 3   Tired, decreased energy 2 3 3   Change in appetite 1 3 0  Feeling bad or failure about yourself  0 3 0  Trouble concentrating 2 3 3   Moving slowly or fidgety/restless 0 1 3  Suicidal thoughts 0 0 0  PHQ-9 Score 6 18 12   Difficult doing work/chores Somewhat difficult Very difficult Very difficult      09/22/2023    8:33 AM 08/11/2023    8:36 AM 06/17/2023    2:56 PM 04/19/2023    3:11 PM  GAD 7 : Generalized Anxiety Score  Nervous, Anxious, on Edge 0 2 0 0  Control/stop worrying 0 2 0 0  Worry too much - different things 0 2 0 0  Trouble relaxing 2 2 1 1   Restless 0 0 0 1  Easily annoyed or irritable 2 3 0 1  Afraid - awful might  happen 0 0 0 0  Total GAD 7 Score 4 11 1 3   Anxiety Difficulty Somewhat difficult Somewhat difficult Not difficult at all Somewhat difficult        ROS As per HPI.    Objective:     BP 97/63   Pulse 68   Temp 97.9 F (36.6 C) (Temporal)   Ht 5' 3 (1.6 m)   Wt 193 lb (87.5 kg)   LMP 11/04/2018   SpO2 97%   BMI 34.19 kg/m    Physical Exam Vitals and nursing note reviewed.  Constitutional:      General: She is not in acute distress.    Appearance: Normal appearance. She is not ill-appearing, toxic-appearing or diaphoretic.  Cardiovascular:     Rate and Rhythm: Normal rate and regular rhythm.     Pulses: Normal pulses.     Heart sounds: Normal heart sounds. No murmur heard. Pulmonary:     Effort: Pulmonary effort is normal.     Breath sounds: Normal breath sounds.  Musculoskeletal:     Right lower leg: No edema.  Left lower leg: No edema.  Skin:    General: Skin is warm and dry.     Capillary Refill: Capillary refill takes less than 2 seconds.  Neurological:     General: No focal deficit present.     Mental Status: She is alert and oriented to person, place, and time.  Psychiatric:        Mood and Affect: Mood normal.        Behavior: Behavior normal.      No results found for any visits on 09/22/23.    The 10-year ASCVD risk score (Arnett DK, et al., 2019) is: 0.2%    Assessment & Plan:   Emanuela Runnion was seen today for medical management of chronic issues.  Diagnoses and all orders for this visit:  Generalized anxiety disorder Depression, recurrent (HCC) Improvement in PHQ and GAD with increase in prozac . Discussed further increase to 60 mg daily. She reports having plenty on hand to increase dosage. Will follow up in 6 weeks for recheck.  Attention deficit hyperactivity disorder (ADHD), unspecified ADHD type Referral contact info has been provided to schedule an appt for evaluation and treatment.    Return in about 6 weeks (around  11/03/2023) for medication follow up.   The patient indicates understanding of these issues and agrees with the plan.  [name], FNP

## 2023-09-22 NOTE — Patient Instructions (Signed)
 [facility] [address] - [place] [address] [phone]

## 2023-10-08 ENCOUNTER — Encounter: Payer: Self-pay | Admitting: Family Medicine

## 2023-10-10 ENCOUNTER — Other Ambulatory Visit: Payer: Self-pay | Admitting: Family Medicine

## 2023-10-10 DIAGNOSIS — F603 Borderline personality disorder: Secondary | ICD-10-CM

## 2023-10-10 DIAGNOSIS — J301 Allergic rhinitis due to pollen: Secondary | ICD-10-CM

## 2023-10-10 DIAGNOSIS — F411 Generalized anxiety disorder: Secondary | ICD-10-CM

## 2023-10-19 DIAGNOSIS — H6691 Otitis media, unspecified, right ear: Secondary | ICD-10-CM | POA: Diagnosis not present

## 2023-11-02 ENCOUNTER — Encounter: Payer: Self-pay | Admitting: Family Medicine

## 2023-11-02 ENCOUNTER — Telehealth: Payer: Self-pay | Admitting: Family Medicine

## 2023-11-02 ENCOUNTER — Other Ambulatory Visit: Payer: Self-pay | Admitting: Medical Genetics

## 2023-11-02 ENCOUNTER — Ambulatory Visit (INDEPENDENT_AMBULATORY_CARE_PROVIDER_SITE_OTHER): Admitting: Family Medicine

## 2023-11-02 VITALS — BP 100/60 | HR 82 | Temp 98.0°F | Ht 63.0 in | Wt 193.0 lb

## 2023-11-02 DIAGNOSIS — F909 Attention-deficit hyperactivity disorder, unspecified type: Secondary | ICD-10-CM

## 2023-11-02 DIAGNOSIS — H6693 Otitis media, unspecified, bilateral: Secondary | ICD-10-CM | POA: Diagnosis not present

## 2023-11-02 DIAGNOSIS — F411 Generalized anxiety disorder: Secondary | ICD-10-CM | POA: Diagnosis not present

## 2023-11-02 DIAGNOSIS — F339 Major depressive disorder, recurrent, unspecified: Secondary | ICD-10-CM | POA: Diagnosis not present

## 2023-11-02 DIAGNOSIS — F603 Borderline personality disorder: Secondary | ICD-10-CM

## 2023-11-02 MED ORDER — CEFDINIR 300 MG PO CAPS: 300.0000 mg | ORAL_CAPSULE | Freq: Two times a day (BID) | ORAL | 0 refills | Status: AC

## 2023-11-02 NOTE — Telephone Encounter (Signed)
 Patient requesting medication not on active med list. Please see below.   Medication: Metoprolol    Has the patient contacted their pharmacy? Yes (Agent: If no, request that the patient contact the pharmacy for the refill. If patient does not wish to contact the pharmacy document the reason why and proceed with request.) (Agent: If yes, when and what did the pharmacy advise?)   This is the patient's preferred pharmacy:  [facility] - [place], [place] - [address] [place] [address] Phone: [phone] Fax: [phone]

## 2023-11-02 NOTE — Telephone Encounter (Signed)
 Just wanted to verify we aren't prescribing the metoprolol ? Neuropsychiatrist?

## 2023-11-02 NOTE — Progress Notes (Signed)
 Established Patient Office Visit  Subjective   Patient ID: Stephanie Dyer, female    DOB: [birth year]  Age: 48 y.o. MRN: [id number]  Chief Complaint  Patient presents with   Medical Management of Chronic Issues    HPI  History of Present Illness   Stephanie Dyer is a 48 year old female who presents with persistent right ear pain.  Right otalgia and associated symptoms - Persistent right ear pain for over one month - Pain is constant and deep within the ear - Accompanied by a 'whoosh' and throbbing sensation when bending over - No improvement after completing a course of Augmentin  for a diagnosed right ear infection one and a half weeks ago - Bulging eardrum noted - No fever, cough, or congestion - No ear drainage  Neuropsychiatric symptoms and medication management - History of depression and anxiety - Recent medication adjustments by neuropsychiatrist - Topamax  discontinued due to memory impairment - Metoprolol  ER initiated at lowest dose - Continues Prozac  and hydroxyzine at night - Takes B12 and vitamin D  in the morning  Cognitive and attention difficulties - Undergoing evaluation for ADHD; psychological exam scheduled - Difficulty with focus and attention, described as 'a hundred tabs open' in her mind - Able to multitask but struggles to maintain attention          09/22/2023    8:32 AM 08/11/2023    8:35 AM 07/26/2023   11:09 AM  Depression screen PHQ 2/9  Decreased Interest 0 1 0  Down, Depressed, Hopeless 0 2 0  PHQ - 2 Score 0 3 0  Altered sleeping 1 2 3   Tired, decreased energy 2 3 3   Change in appetite 1 3 0  Feeling bad or failure about yourself  0 3 0  Trouble concentrating 2 3 3   Moving slowly or fidgety/restless 0 1 3  Suicidal thoughts 0 0 0  PHQ-9 Score 6 18 12   Difficult doing work/chores Somewhat difficult Very difficult Very difficult      09/22/2023    8:33 AM 08/11/2023    8:36 AM 06/17/2023    2:56 PM 04/19/2023     3:11 PM  GAD 7 : Generalized Anxiety Score  Nervous, Anxious, on Edge 0 2 0 0  Control/stop worrying 0 2 0 0  Worry too much - different things 0 2 0 0  Trouble relaxing 2 2 1 1   Restless 0 0 0 1  Easily annoyed or irritable 2 3 0 1  Afraid - awful might happen 0 0 0 0  Total GAD 7 Score 4 11 1 3   Anxiety Difficulty Somewhat difficult Somewhat difficult Not difficult at all Somewhat difficult        ROS As per HPI.    Objective:     BP 100/60   Pulse 82   Temp 98 F (36.7 C) (Temporal)   Ht 5' 3 (1.6 m)   Wt 193 lb (87.5 kg)   LMP 11/04/2018   SpO2 97%   BMI 34.19 kg/m    Physical Exam Vitals and nursing note reviewed.  Constitutional:      General: She is not in acute distress.    Appearance: Normal appearance. She is not ill-appearing, toxic-appearing or diaphoretic.  HENT:     Right Ear: Ear canal and external ear normal. A middle ear effusion is present. Tympanic membrane is erythematous and bulging. Tympanic membrane is not perforated.     Left Ear: Ear canal and external ear normal.  A middle ear effusion is present. Tympanic membrane is bulging. Tympanic membrane is not perforated or erythematous.  Cardiovascular:     Rate and Rhythm: Normal rate and regular rhythm.     Pulses: Normal pulses.     Heart sounds: Normal heart sounds. No murmur heard. Pulmonary:     Effort: Pulmonary effort is normal.     Breath sounds: Normal breath sounds.  Musculoskeletal:     Right lower leg: No edema.     Left lower leg: No edema.  Skin:    General: Skin is warm and dry.     Capillary Refill: Capillary refill takes less than 2 seconds.  Neurological:     General: No focal deficit present.     Mental Status: She is alert and oriented to person, place, and time.  Psychiatric:        Mood and Affect: Mood normal.        Behavior: Behavior normal.      No results found for any visits on 11/02/23.    The 10-year ASCVD risk score (Arnett DK, et al., 2019) is:  0.3%    Assessment & Plan:   Stephanie Dyer was seen today for medical management of chronic issues.  Diagnoses and all orders for this visit:  Acute bilateral otitis media -     cefdinir  (OMNICEF ) 300 MG capsule; Take 1 capsule (300 mg total) by mouth 2 (two) times daily for 10 days. 1 po BID  Generalized anxiety disorder  Depression, recurrent (HCC)  Attention deficit hyperactivity disorder (ADHD), unspecified ADHD type  Borderline personality disorder (HCC)       Right Otitis Media Persistent right ear infection with constant pain and bulging eardrum. Initial Augmentin  treatment ineffective. Possible left ear infection. - Prescribe Omnicef  twice daily for ten days.  Depression, Anxiety, borderline personality disorder Managing depression and anxiety with recent medication changes. Topamax  discontinued due to memory concerns. Metoprolol  ER initiated with reassurance on minimal blood pressure impact. Continuing Prozac  and hydroxyzine. Advised on B12, vitamin D , and energy drinks. - Continue follow up with psychiatrist for management.  Suspected ADHD Suspected ADHD with scheduled psychological evaluation. Psychiatrist acknowledges need for formal assessment - Proceed with psychological evaluation at [facility].      Schedule CPE. Follow up sooner for new or worsening symptoms.   The patient indicates understanding of these issues and agrees with the plan.  Stephanie Dyer Search, FNP

## 2023-11-02 NOTE — Telephone Encounter (Signed)
 Copied from CRM [id number]. Topic: Clinical - Medication Refill >> Nov 02, 2023  5:43 PM Stephanie Dyer wrote: Medication: Metoprolol   Has the patient contacted their pharmacy? Yes (Agent: If no, request that the patient contact the pharmacy for the refill. If patient does not wish to contact the pharmacy document the reason why and proceed with request.) (Agent: If yes, when and what did the pharmacy advise?)  This is the patient's preferred pharmacy:  [facility] - [place], [place] - [address] [place] [address] Phone: [phone] Fax: [phone]  Is this the correct pharmacy for this prescription? Yes If no, delete pharmacy and type the correct one.   Has the prescription been filled recently? No  Is the patient out of the medication? Yes  Has the patient been seen for an appointment in the last year OR does the patient have an upcoming appointment? Yes  Can we respond through MyChart? Yes  Agent: Please be advised that Rx refills may take up to 3 business days. We ask that you follow-up with your pharmacy.

## 2023-11-03 MED ORDER — METOPROLOL SUCCINATE ER 25 MG PO TB24: 25.0000 mg | ORAL_TABLET | Freq: Every day | ORAL | 1 refills | Status: AC

## 2023-11-03 NOTE — Telephone Encounter (Signed)
 Yes, I was under the impression that neuropsychiatrist was prescribing. If not, ok to send in metoprolol  ER 25 mg daily.

## 2023-11-03 NOTE — Telephone Encounter (Signed)
 Pt states the neuropsychiatrist wanted tiffany to send it in and rx sent in per Tiffany but pt needs 3 month follow up per TM so scheduled. Also the metoprolol  is to take the place of topiramate  so discontinued the topiramate .

## 2023-11-03 NOTE — Addendum Note (Signed)
 Addended by: RANDINE ARNULFO MATSU on: 11/03/2023 04:59 PM   Modules accepted: Orders

## 2023-11-08 ENCOUNTER — Encounter: Payer: Self-pay | Admitting: Family Medicine

## 2023-11-09 ENCOUNTER — Other Ambulatory Visit (HOSPITAL_COMMUNITY)
Admission: RE | Admit: 2023-11-09 | Discharge: 2023-11-09 | Disposition: A | Discharge status: Home / Self Care | Payer: Self-pay | Source: Ambulatory Visit | Attending: Medical Genetics | Admitting: Medical Genetics

## 2023-11-15 LAB — GENECONNECT MOLECULAR SCREEN: Genetic Analysis Overall Interpretation: NEGATIVE

## 2023-11-26 DIAGNOSIS — H65191 Other acute nonsuppurative otitis media, right ear: Secondary | ICD-10-CM | POA: Diagnosis not present

## 2023-12-15 ENCOUNTER — Other Ambulatory Visit: Payer: Self-pay | Admitting: Family Medicine

## 2023-12-19 ENCOUNTER — Telehealth: Payer: Self-pay | Admitting: Family Medicine

## 2023-12-19 NOTE — Telephone Encounter (Signed)
 Copied from CRM [id number]. Topic: Clinical - Medication Refill >> Dec 19, 2023 11:20 AM Zebedee SAUNDERS wrote: Medication: pantoprazole  (PROTONIX ) 40 MG tablet  Has the patient contacted their pharmacy? Yes (Agent: If no, request that the patient contact the pharmacy for the refill. If patient does not wish to contact the pharmacy document the reason why and proceed with request.) (Agent: If yes, when and what did the pharmacy advise?)  This is the patient's preferred pharmacy:   [facility] - [place] - [address] [place] [address] Phone: [phone] Fax: [phone]  Is this the correct pharmacy for this prescription? Yes If no, delete pharmacy and type the correct one.   Has the prescription been filled recently? Yes  Is the patient out of the medication? Yes  Has the patient been seen for an appointment in the last year OR does the patient have an upcoming appointment? Yes  Can we respond through MyChart? Yes  Agent: Please be advised that Rx refills may take up to 3 business days. We ask that you follow-up with your pharmacy.

## 2023-12-19 NOTE — Telephone Encounter (Signed)
 Left message advising requested rx sent to pharmacy and to call back with any further questions or concerns.

## 2023-12-20 DIAGNOSIS — H6693 Otitis media, unspecified, bilateral: Secondary | ICD-10-CM | POA: Diagnosis not present

## 2023-12-22 ENCOUNTER — Encounter: Payer: Self-pay | Admitting: Family Medicine

## 2023-12-22 ENCOUNTER — Ambulatory Visit (INDEPENDENT_AMBULATORY_CARE_PROVIDER_SITE_OTHER): Admitting: Family Medicine

## 2023-12-22 VITALS — BP 91/60 | HR 73 | Temp 98.1°F | Ht 63.0 in | Wt 200.8 lb

## 2023-12-22 DIAGNOSIS — M459 Ankylosing spondylitis of unspecified sites in spine: Secondary | ICD-10-CM

## 2023-12-22 DIAGNOSIS — L819 Disorder of pigmentation, unspecified: Secondary | ICD-10-CM | POA: Diagnosis not present

## 2023-12-22 DIAGNOSIS — Z23 Encounter for immunization: Secondary | ICD-10-CM | POA: Diagnosis not present

## 2023-12-22 DIAGNOSIS — F5104 Psychophysiologic insomnia: Secondary | ICD-10-CM | POA: Diagnosis not present

## 2023-12-22 DIAGNOSIS — T7819XD Other adverse food reactions, not elsewhere classified, subsequent encounter: Secondary | ICD-10-CM | POA: Diagnosis not present

## 2023-12-22 DIAGNOSIS — F603 Borderline personality disorder: Secondary | ICD-10-CM

## 2023-12-22 DIAGNOSIS — T7819XA Other adverse food reactions, not elsewhere classified, initial encounter: Secondary | ICD-10-CM

## 2023-12-22 MED ORDER — EPINEPHRINE 0.3 MG/0.3ML IJ SOAJ: INTRAMUSCULAR | 0 refills | Status: DC

## 2023-12-22 NOTE — Progress Notes (Signed)
 Acute Office Visit  Subjective:     Patient ID: Stephanie Dyer, female    DOB: [birth year], 48 y.o.   MRN: [id number]  Chief Complaint  Patient presents with   Medical Management of Chronic Issues    HPI  History of Present Illness   Stephanie Dyer is a 48 year old female who presents with sleep disturbances and skin lesions.  Sleep disturbance - Difficulty initiating and maintaining sleep, averaging only a few hours of sleep per night - Sleep tracking device shows an average awake time of 2 hours and 8 minutes per night, with approximately 44% of the time recorded as awake - Discontinued energy drink consumption approximately 1.5 months ago - Hydroxyzine use for sleep, with increased appetite and weight gain as side effects - Uncertain about presence of snoring; has not been told by others whether she snores - Believes she wakes up more frequently than indicated by her sleep tracking device - Neuropsychiatrist has recommended a sleep study for possible OSA  Abnormal skin lesion - Two skin lesions present: one on the hand and one on the inner thigh - Hand lesion present for 5-6 years, with recent changes over the past month including increased size, raised appearance, and pruritus; no bleeding - Inner thigh lesion is flesh-colored, slightly raised, and pruritic, especially during shaving; bleeds only when nicked during shaving - No bleeding from the hand lesion - Both lesions are pruritic, particularly during shaving  Food allergies with anaphylaxis - Severe peanut  and shellfish allergies, confirmed by food challenges - History of anaphylaxis to peanuts - Severe reaction during a food challenge at [facility], including coughing and need for immediate intervention - Needs refill of epi pen       ROS As per HPI.      Objective:    BP 91/60   Pulse 73   Temp 98.1 F (36.7 C) (Temporal)   Ht 5' 3 (1.6 m)   Wt 200 lb 12.8 oz (91.1 kg)   LMP  11/04/2018   SpO2 95%   BMI 35.57 kg/m  Wt Readings from Last 3 Encounters:  12/22/23 200 lb 12.8 oz (91.1 kg)  11/02/23 193 lb (87.5 kg)  09/22/23 193 lb (87.5 kg)      Physical Exam Vitals and nursing note reviewed.  Constitutional:      General: She is not in acute distress.    Appearance: She is obese. She is not ill-appearing, toxic-appearing or diaphoretic.  Cardiovascular:     Pulses: Normal pulses.  Pulmonary:     Effort: Pulmonary effort is normal. No respiratory distress.  Musculoskeletal:     Right lower leg: No edema.     Left lower leg: No edema.  Skin:    General: Skin is warm and dry.     Findings: Lesion (right hand, right thigh. See pictures below.) present.  Neurological:     General: No focal deficit present.     Mental Status: She is alert and oriented to person, place, and time.  Psychiatric:        Mood and Affect: Mood normal.        Behavior: Behavior normal.        No results found for any visits on 12/22/23.      Assessment & Plan:   Alany Borman was seen today for medical management of chronic issues.  Diagnoses and all orders for this visit:  Chronic insomnia -     Ambulatory referral to  Sleep Studies  Obesity, morbid (HCC) -     Ambulatory referral to Sleep Studies  Borderline personality disorder (HCC)  Allergic reaction to shellfish -     EPINEPHrine  0.3 mg/0.3 mL IJ SOAJ injection; INJECT 0.3MG  INTRAMUSCULARLY AS NEEDED  Ankylosing spondylitis, unspecified site of spine (HCC)  Atypical pigmented skin lesion -     Ambulatory referral to Dermatology   Assessment and Plan    Sleep disturbance Chronic insomnia with significant nocturnal awakenings.  - Refer to sleep medicine   Morbid obesity - Evaluate insurance coverage for weight loss medication if sleep apnea is diagnosed.  Skin lesions, hand and inner thigh Abnormal with recent changes - Refer to dermatology for evaluation. - Send pictures of lesions to  dermatology for expedited evaluation.  Peanut  and shellfish allergy with anaphylaxis Refill epi pen     Borderline personality disorder Continue follow up with neuropsychiatry  Ankylosing spondylitis Established with rheumatology.   Return to office for new or worsening symptoms, or if symptoms persist. Keep scheduled follow up.   The patient indicates understanding of these issues and agrees with the plan.  [name], FNP

## 2023-12-30 ENCOUNTER — Ambulatory Visit: Admitting: Family Medicine

## 2024-01-16 ENCOUNTER — Other Ambulatory Visit: Payer: Self-pay | Admitting: Allergy & Immunology

## 2024-01-19 ENCOUNTER — Ambulatory Visit

## 2024-01-19 ENCOUNTER — Ambulatory Visit (INDEPENDENT_AMBULATORY_CARE_PROVIDER_SITE_OTHER): Admitting: Family Medicine

## 2024-01-19 ENCOUNTER — Encounter: Payer: Self-pay | Admitting: Family Medicine

## 2024-01-19 ENCOUNTER — Ambulatory Visit: Payer: Self-pay | Admitting: Family Medicine

## 2024-01-19 VITALS — BP 93/62 | HR 73 | Temp 98.0°F | Ht 63.0 in | Wt 201.0 lb

## 2024-01-19 DIAGNOSIS — E538 Deficiency of other specified B group vitamins: Secondary | ICD-10-CM

## 2024-01-19 DIAGNOSIS — M25572 Pain in left ankle and joints of left foot: Secondary | ICD-10-CM

## 2024-01-19 DIAGNOSIS — D509 Iron deficiency anemia, unspecified: Secondary | ICD-10-CM

## 2024-01-19 DIAGNOSIS — I2699 Other pulmonary embolism without acute cor pulmonale: Secondary | ICD-10-CM

## 2024-01-19 DIAGNOSIS — M79672 Pain in left foot: Secondary | ICD-10-CM

## 2024-01-19 DIAGNOSIS — Z5320 Procedure and treatment not carried out because of patient's decision for unspecified reasons: Secondary | ICD-10-CM

## 2024-01-19 NOTE — Addendum Note (Signed)
 Addended by: JOESPH ANNABELLA HERO on: 01/19/2024 01:02 PM   Modules accepted: Level of Service

## 2024-01-19 NOTE — Progress Notes (Addendum)
   Acute Office Visit  Subjective:     Patient ID: Stephanie Dyer, female    DOB: June 23, 1975, 48 y.o.   MRN: [id number]  Chief Complaint  Patient presents with   Foot Pain    HPI  History of Present Illness   Evynn Boutelle is a 48 year old female who presents with left heel and ankle pain.  Left heel and ankle pain - Pain present for the past month - Initially localized around the edges of the heel, now progressed to the center and surrounding areas - Described as a 'stabbing ice pick' sensation occurring when standing and while walking - Throbbing sensation noted when at rest - Lateral side of the heel is the most painful area - No recent injury to the area - No relief with plantar fasciitis exercises or use of inserts in shoes - Unable to take anti-inflammatory medications       ROS As per HPI.      Objective:    BP 93/62   Pulse 73   Temp 98 F (36.7 C) (Temporal)   Ht 5' 3 (1.6 m)   Wt 201 lb (91.2 kg)   LMP 11/04/2018   SpO2 97%   BMI 35.61 kg/m    Physical Exam Vitals and nursing note reviewed.  Constitutional:      General: She is not in acute distress.    Appearance: She is not ill-appearing, toxic-appearing or diaphoretic.  Pulmonary:     Effort: Pulmonary effort is normal. No respiratory distress.  Musculoskeletal:     Right lower leg: No edema.     Left lower leg: No edema.     Left ankle: No swelling, deformity, ecchymosis or lacerations. Tenderness (plantar heel, lateral and posterior ankle) present. Normal range of motion.     Left Achilles Tendon: Tenderness present. No defects.  Skin:    General: Skin is warm and dry.  Neurological:     General: No focal deficit present.     Mental Status: She is alert and oriented to person, place, and time.  Psychiatric:        Mood and Affect: Mood normal.        Behavior: Behavior normal.     No results found for any visits on 01/19/24.      Assessment & Plan:    Erminia Mcnew was seen today for foot pain.  Diagnoses and all orders for this visit:  Pain of left heel -     DG Ankle Complete Left; Future  Acute left ankle pain -     DG Ankle Complete Left; Future   Assessment and Plan    Left heel and ankle pain Differential includes plantar fasciitis, Achilles tendon pathology, heel spur. Previous interventions ineffective. - Ordered x-ray of left ankle and heel. - Discussed referral to foot and ankle specialist pending radiology report. - Advised RICE therapy      Addendum: heel spur and soft tissue swelling noted around achilles tendon. Per radiology read, there are no acute findings. Patient noticed. Continue RICE therapy, avoid weight bearing, and referral to ortho placed.   Return to office for new or worsening symptoms, or if symptoms persist.   The patient indicates understanding of these issues and agrees with the plan.   [name], FNP

## 2024-01-19 NOTE — Addendum Note (Signed)
 Addended by: Ranessa Kosta A on: 01/19/2024 11:58 AM   Modules accepted: Orders

## 2024-01-23 ENCOUNTER — Inpatient Hospital Stay

## 2024-01-24 ENCOUNTER — Encounter: Payer: Self-pay | Admitting: *Deleted

## 2024-01-26 ENCOUNTER — Encounter: Payer: Self-pay | Admitting: Family Medicine

## 2024-01-28 NOTE — Progress Notes (Unsigned)
 [facility] [address], [place] [address]   CLINIC:  Medical Oncology/Hematology  PCP:  Joesph Annabella HERO, FNP [address] 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 female returns for routine follow-up of  iron deficiency and her history of DVT/PE x2 (refused chronic anticoagulation).  She was last evaluated via telemedicine visit by Pleasant Barefoot PA-C on 08/03/2023.    At today's visit, she reports feeling fair, apart from some fatigue. She has ongoing baseline fatigue (energy around 25%), with appetite 50%.  She reports that her weight is overall stable. She is taking Xarelto  daily as prescribed.  She reports some intermittent chest pain at rest since Saturday, described as both sharp as well as dull/achy.  She reports similar symptoms in the past were due to costochondritis.  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  She denies any associated shortness of breath, nausea, diaphoresis, or lightheadedness.  No chest pain exacerbated by exertion.  Pain is not reproducible on exam, but she does report some relief of pain when she massages the area.  No symptoms of DVT such as unilateral leg swelling, pain, and erythema.   No symptoms of PE such as new shortness of breath, cough, hemoptysis, or palpitations.   She denies any major bleeding events, melena, or hematochezia.   She did have nosebleed yesterday following injury, which lasted about 45 minutes; no recurrent epistaxis.   She denies any pica, lightheadedness, syncope, or abnormal headaches (regular migraines).  She has been taking vitamin D  50,000 units weekly as well as vitamin D3 1000 units daily. She is injecting vitamin B12 1000 mcg  weekly at home.  ASSESSMENT & PLAN:  1.  Recurrent PE, on chronic anticoagulation - Initial pulmonary embolism in 2008, provoked in the setting of hospitalization for bronchitis/pneumonia - Second pulmonary embolism in June 2022 was unprovoked.  Diagnosed in ED at [facility] after patient presented with complaints of left-sided chest pain and LLE pain - CTA chest (08/16/2020) showed age-indeterminate subsegmental PE but no evidence of acute PE.  - Venous duplex ultrasound of bilateral lower extremities (08/29/2020): Negative for DVT - Patient reports a family history of blood clots, but no family members with definitive diagnosis of hypercoagulable disorder - Hypercoagulable work-up (08/29/2020) was unremarkable  - She is not on any oral birth control - At visit in July 2024, patient made the informed decision to stop taking Xarelto  AGAINST MEDICAL ADVICE, but reports that she changed her mind and decided to restart Xarelto  in December 2024.   - No signs or symptoms of recurrent DVT/PE - PLAN: Due to history of recurrent DVT/PE with at least 1 unprovoked episode, continue to recommend indefinite anticoagulation.  Continue Xarelto .  Refill Rx for Xarelto  x 1 year has been sent to pharmacy.   2.   Iron deficiency state, without anemia - Patient had history of gastric bypass in 2011, has had issues with iron level since that time and has required intermittent IV iron infusions - Iron levels failed to improve with oral supplementation.  Suspect malabsorption secondary to gastric bypass surgery. - Most recent IV iron with Feraheme  x1 on 02/02/2023 - Most recent labs (01/19/2024): Hgb 14.5 MCV 90, ferritin 174, iron saturation 37% - She  has ongoing fatigue, but denies any signs or symptoms of major blood loss - PLAN: No indication for IV iron at this time. -  Will check labs with OFFICE visit in 1 year   3.  Other deficiencies, history of gastric bypass - Vitamin B12 failed to improve on oral  supplementation.   -Taking vitamin D  50,000 units weekly as well as vitamin D3 1000 units daily. - Injecting vitamin B12 1000 mcg weekly at home. - Most recent labs (01/19/2024): Vitamin B12 improved at 383/MMA normalized.  Vitamin D  is very low at 16.8.   - PLAN: Continue supplementation as above.  (Refill Rx for vitamin B12 injections and vitamin D  weekly has been sent to pharmacy) - Referral to endocrinology due to severe vitamin D  deficiency refractory to treatment. - Recheck labs in 1 year  4.  Other concerns - Intermittent chest pain at rest since Saturday, described as both sharp as well as dull/achy.  She reports similar symptoms in the past were due to costochondritis.  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  She denies any associated shortness of breath, nausea, diaphoresis, or lightheadedness.  No chest pain exacerbated by exertion.  Pain is not reproducible on exam, but she does report some relief of pain when she massages the area. - PLAN: Recommend reaching out to primary care regarding musculoskeletal chest pain.  Encouraged to seek immediate medical attention if any worsening or alarming symptoms.   PLAN SUMMARY:  >> Refill Rx sent to pharmacy for vitamin D  50,000 units weekly + vitamin B12 injections weekly + Xarelto   >> Referral to endocrinology for refractory vitamin D  deficiency >> Labs in 1 year = CBC/D, ferritin, iron/TIBC, B12, MMA, vitamin D  >> OFFICE visit in 1 year (1 week after labs)  **Last office visit 01/30/2024     REVIEW OF SYSTEMS:   Review of Systems  Constitutional:  Positive for fatigue. Negative for appetite change, chills, diaphoresis, fever and unexpected weight change.  HENT:   Positive for mouth sores. Negative for lump/mass and nosebleeds.   Eyes:  Negative for eye problems.  Respiratory:  Negative for cough, hemoptysis and shortness of breath.   Cardiovascular:  Positive for chest pain. Negative for leg  swelling and palpitations.  Gastrointestinal:  Positive for diarrhea. Negative for abdominal pain, blood in stool, constipation, nausea and vomiting.  Genitourinary:  Negative for hematuria.   Musculoskeletal:  Positive for arthralgias.  Skin: Negative.   Neurological:  Positive for headaches. Negative for dizziness and light-headedness.  Hematological:  Does not bruise/bleed easily.  Psychiatric/Behavioral:  Positive for depression and sleep disturbance. The patient is nervous/anxious.      PHYSICAL EXAM:  ECOG PERFORMANCE STATUS: 1 - Symptomatic but completely ambulatory  Vitals:   01/30/24 0829  BP: 104/66  Pulse: 60  Resp: 18  Temp: 98.9 F (37.2 C)  SpO2: 96%   Filed Weights   01/30/24 0829  Weight: 199 lb (90.3 kg)   Physical Exam Constitutional:      Appearance: Normal appearance. She is obese.  Cardiovascular:     Heart sounds: Normal heart sounds.  Pulmonary:     Breath sounds: Normal breath sounds.  Neurological:     General: No focal deficit present.     Mental Status: Mental status is at baseline.  Psychiatric:        Behavior: Behavior normal. Behavior is cooperative.     PAST MEDICAL/SURGICAL HISTORY:  Past Medical History:  Diagnosis Date  Angio-edema    Ankylosing spondylitis (HCC)    Anxiety    Arthritis    Asthma    Cervical cancer (HCC) 1998   Elevated LFTs    Environmental allergies    GERD (gastroesophageal reflux disease)    History of gastric bypass    History of iron deficiency anemia    Hypoglycemia    Kidney stones    Lumbar back pain    Migraines    PCOS (polycystic ovarian syndrome)    Pseudotumor cerebri 2006   Thyroid  cyst    Urticaria    Vitamin B12 deficiency 03/31/2020   Vitamin D  deficiency    Past Surgical History:  Procedure Laterality Date   ADENOIDECTOMY     CESAREAN SECTION  2016   CHOLECYSTECTOMY  1998   COSMETIC SURGERY  2015   GASTRIC BYPASS  2014   KNEE SURGERY Left 2001   LITHOTRIPSY  2021    TONSILLECTOMY     TOTAL ABDOMINAL HYSTERECTOMY  10/2018    SOCIAL HISTORY:  Social History   Socioeconomic History   Marital status: Single    Spouse name: Not on file   Number of children: 2   Years of education: Not on file   Highest education level: GED or equivalent  Occupational History   Not on file  Tobacco Use   Smoking status: Former    Current packs/day: 0.00    Types: Cigarettes    Quit date: 11/27/2012    Years since quitting: 11.1   Smokeless tobacco: Never  Vaping Use   Vaping status: Every Day  Substance and Sexual Activity   Alcohol use: Not Currently   Drug use: Not Currently    Types: Marijuana   Sexual activity: Yes    Birth control/protection: Other-see comments  Other Topics Concern   Not on file  Social History Narrative   Makayla- 79, Alex- 7   Social Drivers of Corporate Investment Banker Strain: High Risk (12/21/2023)   Overall Financial Resource Strain (CARDIA)    Difficulty of Paying Living Expenses: Very hard  Food Insecurity: Food Insecurity Present (12/21/2023)   Hunger Vital Sign    Worried About Running Out of Food in the Last Year: Sometimes true    Ran Out of Food in the Last Year: Never true  Transportation Needs: No Transportation Needs (12/21/2023)   PRAPARE - Administrator, Civil Service (Medical): No    Lack of Transportation (Non-Medical): No  Physical Activity: Insufficiently Active (12/21/2023)   Exercise Vital Sign    Days of Exercise per Week: 4 days    Minutes of Exercise per Session: 30 min  Stress: Stress Concern Present (12/21/2023)   [employer] of Occupational Health - Occupational Stress Questionnaire    Feeling of Stress: Very much  Social Connections: Moderately Isolated (12/21/2023)   Social Connection and Isolation Panel    Frequency of Communication with Friends and Family: More than three times a week    Frequency of Social Gatherings with Friends and Family: Once a week    Attends  Religious Services: Never    Database Administrator or Organizations: Yes    Attends Engineer, Structural: More than 4 times per year    Marital Status: Divorced  Intimate Partner Violence: Not At Risk (07/26/2023)   Humiliation, Afraid, Rape, and Kick questionnaire    Fear of Current or Ex-Partner: No    Emotionally Abused: No    Physically Abused: No  Sexually Abused: No    FAMILY HISTORY:  Family History  Problem Relation Age of Onset   Alcohol abuse Mother    Diabetes Mother    COPD Mother    Emphysema Mother    Asthma Mother    Hypertension Father    Heart disease Father    Heart attack Father    CVA Brother    Diabetes Brother    Hypertension Brother    Diabetes Maternal Grandmother    Parkinson's disease Maternal Grandmother    Macular degeneration Maternal Grandmother    Thyroid  cancer Maternal Grandmother    Stomach cancer Maternal Grandmother    Cervical cancer Maternal Grandmother    Migraines Maternal Grandmother    Suicidality Maternal Grandfather    Autism Son    Hearing loss Son    Allergic rhinitis Neg Hx    Eczema Neg Hx    Urticaria Neg Hx    Breast cancer Neg Hx     CURRENT MEDICATIONS:  Outpatient Encounter Medications as of 01/30/2024  Medication Sig   levocetirizine (XYZAL ) 5 MG tablet Take 5 mg by mouth every evening.   triamcinolone  ointment (KENALOG ) 0.1 % Apply 1 Application topically 2 (two) times daily.   albuterol  (2.5 MG/3ML) 0.083% NEBU 3 mL, albuterol  (5 MG/ML) 0.5% NEBU 0.5 mL Inhale into the lungs.   albuterol  (VENTOLIN  HFA) 108 (90 Base) MCG/ACT inhaler Inhale 2 puffs into the lungs every 6 (six) hours as needed for wheezing or shortness of breath. **NEEDS TO BE SEEN BEFORE NEXT REFILL**   B-D 3CC LUER-LOK SYR 25GX1 25G X 1 3 ML MISC USE EACH WEEK FOR B12 INJECTIONS   Continuous Glucose Sensor (FREESTYLE LIBRE 3 PLUS SENSOR) MISC Use to monitor glucose continuously as directed. Change sensor every 15 days.    cyanocobalamin  (VITAMIN B12) 1000 MCG/ML injection Inject 1 mL (1,000 mcg total) into the muscle once a week.   EPINEPHrine  0.3 mg/0.3 mL IJ SOAJ injection INJECT 0.3MG  INTRAMUSCULARLY AS NEEDED   ergocalciferol  (VITAMIN D2) 1.25 MG (50000 UT) capsule Take 1 capsule (50,000 Units total) by mouth once a week.   FLUoxetine  (PROZAC ) 40 MG capsule Take 1 capsule (40 mg total) by mouth daily.   fluticasone  (FLONASE ) 50 MCG/ACT nasal spray INSTILL 2 SPRAYS IN EACH NOSTRIL DAILY   metoprolol  succinate (TOPROL -XL) 25 MG 24 hr tablet Take 1 tablet (25 mg total) by mouth daily.   Needles & Syringes MISC Injection supplies for every 3-week vitamin B12 injections x 6 months with 1 refill.   pantoprazole  (PROTONIX ) 40 MG tablet TAKE ONE TABLET (40MG  TOTAL) BY MOUTH DAILY AT 9AM   rivaroxaban  (XARELTO ) 20 MG TABS tablet Take 1 tablet (20 mg total) by mouth daily with supper.   [DISCONTINUED] B-D 3CC LUER-LOK SYR 25GX1 25G X 1 3 ML MISC USE EACH MONTH FOR B12 INJECTIONS   [DISCONTINUED] cyanocobalamin  (VITAMIN B12) 1000 MCG/ML injection INJECT 1 MLS (1,000MCG TOTAL) INTRAMUSCULARLY ONCE EVERY 21 DAYS   [DISCONTINUED] ergocalciferol  (VITAMIN D2) 1.25 MG (50000 UT) capsule Take 1 capsule (50,000 Units total) by mouth once a week.   [DISCONTINUED] XARELTO  20 MG TABS tablet TAKE ONE TABLET (20MG  TOTAL) BY MOUTH DAILY AT 5PM WITH SUPPER   No facility-administered encounter medications on file as of 01/30/2024.    ALLERGIES:  Allergies  Allergen Reactions   Codeine Anaphylaxis and Other (See Comments)    Other reaction(s): Projectile vomiting, throat closes Headache Other reaction(s): Other (See Comments) Headache   Morphine Anaphylaxis  Other reaction(s): Projectile vomiting, throat clos Other reaction(s): Projectile vomiting, throat clos Other reaction(s): Projectile vomiting, throat closes Other reaction(s): Projectile vomiting, throat closes Other reaction(s): Projectile vomiting, throat clos Other  reaction(s): Projectile vomiting, throat closes Headache Other reaction(s): Projectile vomiting, throat clos Other reaction(s): Projectile vomiting, throat clos   Other Anaphylaxis    peanuts Other reaction(s): Other (See Comments) Headache 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   Banana    Fioricet [Butalbital-Apap-Caffeine] Other (See Comments)    Headache   Nsaids Other (See Comments) and Nausea And Vomiting    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   Vancomycin Itching and Other (See Comments)    Itching & flushed skin.   Venlafaxine Other (See Comments)    GI upset   Watermelon Flavoring Agent (Non-Screening)    Zolpidem Other (See Comments)    Ambien-Parasomnia   Latex Rash   Peanut -Containing Drug Products Rash   Shellfish Allergy Swelling and Rash    LABORATORY DATA:  I have reviewed the labs as listed.  CBC    Component Value Date/Time   WBC 4.8 07/27/2023 1032   RBC 5.08 07/27/2023 1032   HGB 14.9 07/27/2023 1032   HGB 14.5 06/17/2023 1538   HCT 45.5 07/27/2023 1032   HCT 43.7 06/17/2023 1538   PLT 226 07/27/2023 1032   PLT 234 06/17/2023 1538   MCV 89.6 07/27/2023 1032   MCV 90 06/17/2023 1538   MCH 29.3 07/27/2023 1032   MCHC 32.7 07/27/2023 1032   RDW 13.5 07/27/2023 1032   RDW 13.1 06/17/2023 1538   LYMPHSABS 1.6 07/27/2023 1032   LYMPHSABS 2.1 06/17/2023 1538   MONOABS 0.4 07/27/2023 1032   EOSABS 0.2 07/27/2023 1032   EOSABS 0.2 06/17/2023 1538   BASOSABS 0.0 07/27/2023 1032   BASOSABS 0.0 06/17/2023 1538      Latest Ref Rng & Units 06/17/2023    3:38 PM 09/08/2022    8:30 AM 05/26/2022    8:19 AM  CMP  Glucose 70 - 99 mg/dL 51  86  885   BUN 6 - 24 mg/dL 12  11  10    Creatinine 0.57 - 1.00 mg/dL 9.19  9.33  9.20   Sodium 134 - 144 mmol/L 143  145  143   Potassium 3.5 - 5.2 mmol/L 4.1  4.0  3.8   Chloride 96 - 106 mmol/L 109  109  111   CO2 20 - 29 mmol/L 21  22  19    Calcium 8.7 - 10.2  mg/dL 8.7  9.1  9.1   Total Protein 6.0 - 8.5 g/dL 6.2  6.4    Total Bilirubin 0.0 - 1.2 mg/dL 0.8  0.6    Alkaline Phos 44 - 121 IU/L 119  110    AST 0 - 40 IU/L 19  17    ALT 0 - 32 IU/L 15  20      DIAGNOSTIC IMAGING:  I have independently reviewed the relevant imaging and discussed with the patient.   WRAP UP:  All questions were answered. The patient knows to call the clinic with any problems, questions or concerns.  Medical decision making: Moderate  Time spent on visit: I spent 20 minutes counseling the patient face to face. The total time spent in the appointment was 30 minutes and more than 50% was on counseling.  Pleasant CHRISTELLA Barefoot, PA-C  01/30/24 9:31 AM

## 2024-01-30 ENCOUNTER — Inpatient Hospital Stay: Attending: Physician Assistant | Admitting: Physician Assistant

## 2024-01-30 ENCOUNTER — Ambulatory Visit: Payer: Self-pay | Admitting: Family Medicine

## 2024-01-30 ENCOUNTER — Encounter: Payer: Self-pay | Admitting: Family Medicine

## 2024-01-30 VITALS — BP 93/61 | HR 67 | Temp 97.9°F | Ht 64.0 in | Wt 199.0 lb

## 2024-01-30 VITALS — BP 104/66 | HR 60 | Temp 98.9°F | Resp 18 | Ht 64.0 in | Wt 199.0 lb

## 2024-01-30 DIAGNOSIS — G43109 Migraine with aura, not intractable, without status migrainosus: Secondary | ICD-10-CM

## 2024-01-30 DIAGNOSIS — F331 Major depressive disorder, recurrent, moderate: Secondary | ICD-10-CM

## 2024-01-30 DIAGNOSIS — F411 Generalized anxiety disorder: Secondary | ICD-10-CM | POA: Diagnosis not present

## 2024-01-30 DIAGNOSIS — E611 Iron deficiency: Secondary | ICD-10-CM | POA: Insufficient documentation

## 2024-01-30 DIAGNOSIS — Z9884 Bariatric surgery status: Secondary | ICD-10-CM | POA: Insufficient documentation

## 2024-01-30 DIAGNOSIS — F17290 Nicotine dependence, other tobacco product, uncomplicated: Secondary | ICD-10-CM | POA: Insufficient documentation

## 2024-01-30 DIAGNOSIS — B001 Herpesviral vesicular dermatitis: Secondary | ICD-10-CM

## 2024-01-30 DIAGNOSIS — F603 Borderline personality disorder: Secondary | ICD-10-CM

## 2024-01-30 DIAGNOSIS — I2699 Other pulmonary embolism without acute cor pulmonale: Secondary | ICD-10-CM | POA: Diagnosis not present

## 2024-01-30 DIAGNOSIS — Z86711 Personal history of pulmonary embolism: Secondary | ICD-10-CM | POA: Insufficient documentation

## 2024-01-30 DIAGNOSIS — E559 Vitamin D deficiency, unspecified: Secondary | ICD-10-CM | POA: Diagnosis not present

## 2024-01-30 DIAGNOSIS — F909 Attention-deficit hyperactivity disorder, unspecified type: Secondary | ICD-10-CM

## 2024-01-30 DIAGNOSIS — Z79899 Other long term (current) drug therapy: Secondary | ICD-10-CM | POA: Insufficient documentation

## 2024-01-30 DIAGNOSIS — Z5320 Procedure and treatment not carried out because of patient's decision for unspecified reasons: Secondary | ICD-10-CM | POA: Diagnosis not present

## 2024-01-30 DIAGNOSIS — E538 Deficiency of other specified B group vitamins: Secondary | ICD-10-CM

## 2024-01-30 DIAGNOSIS — M459 Ankylosing spondylitis of unspecified sites in spine: Secondary | ICD-10-CM

## 2024-01-30 DIAGNOSIS — D509 Iron deficiency anemia, unspecified: Secondary | ICD-10-CM

## 2024-01-30 MED ORDER — CYANOCOBALAMIN 1000 MCG/ML IJ SOLN: 1000.0000 ug | INTRAMUSCULAR | 3 refills | Status: AC

## 2024-01-30 MED ORDER — RIVAROXABAN 20 MG PO TABS: 20.0000 mg | ORAL_TABLET | Freq: Every day | ORAL | 3 refills | Status: AC

## 2024-01-30 MED ORDER — BD LUER-LOK SYRINGE 25G X 1" 3 ML MISC: 3 refills | Status: AC

## 2024-01-30 MED ORDER — NARATRIPTAN HCL 2.5 MG PO TABS
2.5000 mg | ORAL_TABLET | ORAL | 5 refills | Status: AC | PRN
Start: 2024-01-30 — End: ?

## 2024-01-30 MED ORDER — ERGOCALCIFEROL 1.25 MG (50000 UT) PO CAPS: 50000.0000 [IU] | ORAL_CAPSULE | ORAL | 3 refills | Status: AC

## 2024-01-30 MED ORDER — VALACYCLOVIR HCL 1 G PO TABS: ORAL_TABLET | ORAL | 3 refills | Status: AC

## 2024-01-30 NOTE — Progress Notes (Signed)
 Established Patient Office Visit  Subjective   Patient ID: Stephanie Dyer, female    DOB: 12-13-1975  Age: 48 y.o. MRN: [id number]  Chief Complaint  Patient presents with   Migraine    Migraine     History of Present Illness   Stephanie Dyer is a 48 year old female with migraines who presents for follow-up on her migraine management.  Cephalalgia and migraine symptoms - History of migraines, previously managed with Topamax , switched to metoprolol  three months ago due to brain fog, which has since improved - In October, experienced six migraines and approximately two weeks of regular headaches - Currently experiencing nagging headaches but no full-blown migraines - No current medication use for nagging headaches; managing without intervention - Naratriptan  2.5 mg previously worked well for breakthrough migraines.  Blood pressure findings - Blood pressure typically low, with a recent measurement of 104/66 mmHg at the cancer center  Foot pain - Saw ortho for evaluation - Exercises prescribed, wearing boot at night. - Will have follow up next month with ortho  Neuropsychiatric symptoms - History of anxiety, depression, and borderline personality disorder - Currently seeing a new provider in [place] for ADHD evaluation - Scheduled for a four-hour ADHD testing session on Tuesday - Has neuropsychiatry follow up next week.   Fever blisters - Requesting refills of valtrex  for fever blisters - Typically uses a cream such as Abreva and takes valtrex  1000 mg twice a day for one day when symptoms appear          01/30/2024    8:33 AM 01/19/2024   11:41 AM 12/22/2023    3:39 PM  Depression screen PHQ 2/9  Decreased Interest 1 1 0  Down, Depressed, Hopeless 1 1 1   PHQ - 2 Score 2 2 1   Altered sleeping 3 3 3   Tired, decreased energy 3 3 3   Change in appetite 2 2 3   Feeling bad or failure about yourself  0 0 0  Trouble concentrating 0 2 0  Moving  slowly or fidgety/restless 0 0 0  Suicidal thoughts 0 0 0  PHQ-9 Score 10 12 10    Difficult doing work/chores Somewhat difficult Somewhat difficult Extremely dIfficult     Data saved with a previous flowsheet row definition      01/19/2024   11:42 AM 12/22/2023    3:39 PM 11/02/2023    3:16 PM 09/22/2023    8:33 AM  GAD 7 : Generalized Anxiety Score  Nervous, Anxious, on Edge 0 1 0 0  Control/stop worrying 2 1 0 0  Worry too much - different things 2 1 0 0  Trouble relaxing 3 2 0 2  Restless 1 0 1 0  Easily annoyed or irritable 1 3 1 2   Afraid - awful might happen 0 0 0 0  Total GAD 7 Score 9 8 2 4   Anxiety Difficulty Somewhat difficult Somewhat difficult Somewhat difficult Somewhat difficult       ROS As per HPI.    Objective:     BP 93/61   Pulse 67   Temp 97.9 F (36.6 C) (Temporal)   Ht 5' 4 (1.626 m)   Wt 199 lb (90.3 kg)   LMP 11/04/2018   SpO2 95%   BMI 34.16 kg/m  Wt Readings from Last 3 Encounters:  01/30/24 199 lb (90.3 kg)  01/30/24 199 lb (90.3 kg)  01/19/24 201 lb (91.2 kg)      Physical Exam Vitals and nursing note  reviewed.  Constitutional:      General: She is not in acute distress.    Appearance: She is not ill-appearing, toxic-appearing or diaphoretic.  Pulmonary:     Effort: Pulmonary effort is normal. No respiratory distress.  Skin:    General: Skin is warm and dry.  Neurological:     General: No focal deficit present.     Mental Status: She is alert and oriented to person, place, and time.  Psychiatric:        Mood and Affect: Mood normal.        Behavior: Behavior normal.      No results found for any visits on 01/30/24.    The 10-year ASCVD risk score (Arnett DK, et al., 2019) is: 0.2%    Assessment & Plan:   Stephanie Dyer was seen today for migraine.  Diagnoses and all orders for this visit:  Migraine with aura and without status migrainosus, not intractable -     naratriptan  (AMERGE) 2.5 MG tablet; Take 1  tablet (2.5 mg total) by mouth as needed for migraine. Take one (1) tablet at onset of headache; if returns or does not resolve, may repeat after 4 hours; do not exceed five (5) mg in 24 hours.  Generalized anxiety disorder  Depression, major, recurrent, moderate (HCC)  Borderline personality disorder (HCC)  Attention deficit hyperactivity disorder (ADHD), unspecified ADHD type  Fever blister -     valACYclovir  (VALTREX ) 1000 MG tablet; Take 1000 mg twice a day for 1 day at first sign of fever blister.  Ankylosing spondylitis, unspecified site of spine (HCC)  Assessment and Plan    Migraine with aura Having some breakthrough migraines since switching from Topamax  to metoprolol . Low blood pressure limits metoprolol  adjustment. Naratriptan  prescribed for acute attacks. - Continue metoprolol  for prevention. - Use naratriptan  2.5 mg for acute attacks as needed, up to ten doses per month.  Plantar fasciitis with heel spur Continue follow up and management with ortho.  - Perform recommended stretches  - Wear a boot at night. - Consider steroid injection if no improvement in a month with ortho  Fever blisters - Prescribed acyclovir 1000 mg twice a day for one day prn      Anxiety Depression BPD Continue follow up with neuropsychiatry.   ADHD Continue follow up with specialist for formal testing.   Ankylosing spondylitis Continue follow up with rheumatology for management.   Return in about 3 months (around 05/01/2024) for chronic follow up.   The patient indicates understanding of these issues and agrees with the plan.  Stephanie CHRISTELLA Search, FNP

## 2024-01-30 NOTE — Patient Instructions (Signed)
 [facility] at [facility] **VISIT SUMMARY & IMPORTANT INSTRUCTIONS **   You were seen today by Pleasant Barefoot PA-C for your follow-up visit.    Recurrent Pulmonary Embolisms Continue Xarelto  indefinitely.  Iron Deficiency  Your blood and iron levels look great!  You do not need any IV iron at this time.  Vitamin B12 Deficiency Continue weekly vitamin B12 injections.  Vitamin D  Deficiency Your vitamin D  levels remain low despite taking high-dose (50,000 units) vitamin D  weekly in addition to vitamin D  3000 units daily. Continue the above supplementation, but we will also refer you to endocrinology for further management of refractory vitamin D  deficiency.  Other Concerns Chest pain is most likely musculoskeletal in nature, but you are encouraged to seek medical attention if you have any persistent or worsening symptoms.  FOLLOW-UP APPOINTMENT: 1 year  ** Thank you for trusting me with your healthcare!  I strive to provide all of my patients with quality care at each visit.  If you receive a survey for this visit, I would be so grateful to you for taking the time to provide feedback.  Thank you in advance!  ~ Angelynn Lemus                                        Dr. Mickiel Davonna Pleasant Barefoot, PA-C          Delon Hope, NP   - - - - - - - - - - - - - - - - - -    Thank you for choosing [facility] at [facility] to provide your oncology and hematology care.  To afford each patient quality time with our provider, please arrive at least 15 minutes before your scheduled appointment time.   If you have a lab appointment with the Cancer Center please come in thru the Main Entrance and check in at the main information desk.  You need to re-schedule your appointment should you arrive 10 or more minutes late.  We strive to give you quality time with our providers, and arriving late affects you and other patients whose appointments  are after yours.  Also, if you no show three or more times for appointments you may be dismissed from the clinic at the providers discretion.     Again, thank you for choosing [facility].  Our hope is that these requests will decrease the amount of time that you wait before being seen by our physicians.       _____________________________________________________________  Should you have questions after your visit to [facility], please contact our office at [phone] and follow the prompts.  Our office hours are 8:00 a.m. and 4:30 p.m. Monday - Friday.  Please note that voicemails left after 4:00 p.m. may not be returned until the following business day.  We are closed weekends and major holidays.  You do have access to a nurse 24-7, just call the main number to the clinic [phone] and do not press any options, hold on the line and a nurse will answer the phone.    For prescription refill requests, have your pharmacy contact our office and allow 72 hours.

## 2024-02-07 ENCOUNTER — Other Ambulatory Visit: Payer: Self-pay | Admitting: Allergy & Immunology

## 2024-02-08 ENCOUNTER — Other Ambulatory Visit: Payer: Self-pay | Admitting: Family Medicine

## 2024-02-08 DIAGNOSIS — T7819XA Other adverse food reactions, not elsewhere classified, initial encounter: Secondary | ICD-10-CM

## 2024-02-24 NOTE — Telephone Encounter (Signed)
 FYI for PCP, for appt scheduled on Monday, Dec 22nd

## 2024-02-27 ENCOUNTER — Telehealth: Admitting: Family Medicine

## 2024-02-27 ENCOUNTER — Encounter: Payer: Self-pay | Admitting: Family Medicine

## 2024-02-27 DIAGNOSIS — F4310 Post-traumatic stress disorder, unspecified: Secondary | ICD-10-CM

## 2024-02-27 DIAGNOSIS — F422 Mixed obsessional thoughts and acts: Secondary | ICD-10-CM

## 2024-02-27 DIAGNOSIS — F902 Attention-deficit hyperactivity disorder, combined type: Secondary | ICD-10-CM | POA: Diagnosis not present

## 2024-02-27 DIAGNOSIS — F411 Generalized anxiety disorder: Secondary | ICD-10-CM | POA: Diagnosis not present

## 2024-02-27 DIAGNOSIS — F331 Major depressive disorder, recurrent, moderate: Secondary | ICD-10-CM

## 2024-02-27 MED ORDER — ATOMOXETINE HCL 40 MG PO CAPS: ORAL_CAPSULE | ORAL | 1 refills | Status: DC

## 2024-02-27 NOTE — Progress Notes (Signed)
 "                    MyChart Video Visit    Virtual Visit via Video Note   This format is felt to be most appropriate for this patient at this time. Physical exam was limited by quality of the video and audio technology used for the visit.    Patient location: home Provider location: [facility] Persons involved in the visit: patient, provider  I discussed the limitations of evaluation and management by telemedicine and the availability of in person appointments. The patient expressed understanding and agreed to proceed.  Patient: Stephanie Dyer   DOB: [birth year]   48 y.o. Female  MRN: [id number] Visit Date: 02/27/2024  Today's healthcare provider: [name], FNP   Chief Complaint  Patient presents with   ADHD    Subjective:    HPI  History of Present Illness   Stephanie Dyer is a 48 year old female with ADHD, OCD, and PTSD who presents for medication management.  Neuropsychiatric symptoms - Diagnosed with attention-deficit/hyperactivity disorder (ADHD), obsessive-compulsive disorder (OCD), and post-traumatic stress disorder (PTSD). Underwent testing in [place] - Expresses frustration with lack of communication and prescribing practices from neuropsychiatrist who defers medication management to PCP - No follow-up appointments with neuropsychiatrist since official diagnosis - Neuropsychiatrist is out of the country for the next few weeks  Psychotropic medication management - Currently taking fluoxetine  (Prozac ) 40 mg daily and feels like this has been beneficial for anxiety and depression       ROS As per HPI.       Objective:    LMP 11/04/2018       Physical Exam Vitals and nursing note reviewed.  Constitutional:      General: She is not in acute distress.    Appearance: She is not ill-appearing, toxic-appearing or diaphoretic.  Pulmonary:     Effort: Pulmonary effort is normal. No  respiratory distress.  Neurological:     Mental Status: She is alert and oriented to person, place, and time. Mental status is at baseline.  Psychiatric:        Mood and Affect: Mood normal.         Assessment & Plan:    Stephanie Dyer was seen today for adhd.  Diagnoses and all orders for this visit:  ADHD (attention deficit hyperactivity disorder), combined type -     Ambulatory referral to Psychiatry -     atomoxetine  (STRATTERA ) 40 MG capsule; Take 40 mg by mouth daily for 3 days, then increase to 80 mg by mouth daily.  Mixed obsessional thoughts and acts -     Ambulatory referral to Psychiatry  PTSD (post-traumatic stress disorder) -     Ambulatory referral to Psychiatry  Depression, major, recurrent, moderate (HCC) -     Ambulatory referral to Psychiatry  Generalized anxiety disorder -     Ambulatory referral to Psychiatry    Assessment and Plan    Attention-deficit hyperactivity disorder ADHD confirmed. Reviewed testing report with confirmation of diagnoses. Discussed office policy for controlled substances requiring an office visit. Also discussed concerns of stimulants increasing OCD symptoms per psychiatrist report. Will try Strattera  for now. Will follow up in a few weeks. Discussed low dose stimulant if no improvement with Strattera . Discussed need for psychiatrist management given multiple comorbidities. Will place new referral for this and plan to manage until she is established with new psychiatrist.  - Started  Strattera  40 mg daily in the morning. - Increase to 80 mg after three days if needed. - Referred to [facility] in [place] for psychiatric management.  Obsessive-compulsive disorder Post-traumatic stress disorder Depression and anxiety Referral to psychiatry placed for further management.  - Continue Prozac  40 mg daily. Reports beneficial      Follow up in 2-4 weeks.     I discussed the assessment and treatment plan with the patient. The  patient was provided an opportunity to ask questions and all were answered. The patient agreed with the plan and demonstrated an understanding of the instructions.   The patient was advised to call back or seek an in-person evaluation if the symptoms worsen or if the condition fails to improve as anticipated.  I provided 15 minutes of non-face-to-face time during this encounter.  [name], FNP [facility]    "

## 2024-03-05 ENCOUNTER — Encounter: Payer: Self-pay | Admitting: *Deleted

## 2024-03-19 ENCOUNTER — Ambulatory Visit: Admitting: Family Medicine

## 2024-03-19 ENCOUNTER — Encounter: Payer: Self-pay | Admitting: Family Medicine

## 2024-03-19 VITALS — BP 107/72 | HR 69 | Temp 98.3°F | Ht 64.0 in | Wt 204.0 lb

## 2024-03-19 DIAGNOSIS — F902 Attention-deficit hyperactivity disorder, combined type: Secondary | ICD-10-CM | POA: Diagnosis not present

## 2024-03-19 DIAGNOSIS — F331 Major depressive disorder, recurrent, moderate: Secondary | ICD-10-CM | POA: Diagnosis not present

## 2024-03-19 DIAGNOSIS — F411 Generalized anxiety disorder: Secondary | ICD-10-CM | POA: Diagnosis not present

## 2024-03-19 MED ORDER — ATOMOXETINE HCL 100 MG PO CAPS: 100.0000 mg | ORAL_CAPSULE | Freq: Every day | ORAL | 1 refills | Status: AC

## 2024-03-19 NOTE — Patient Instructions (Signed)
 [facility] [address] [place] [address] [phone]

## 2024-03-19 NOTE — Progress Notes (Signed)
 "  Established Patient Office Visit  Subjective   Patient ID: Stephanie Dyer, female    DOB: 04-14-1975  Age: 49 y.o. MRN: [id number]  Chief Complaint  Patient presents with   ADHD    HPI  History of Present Illness   Stephanie Dyer is a 49 year old female who presents for follow-up after starting Ceritinib Strattera .  Neurocognitive symptoms - Continues to experience difficulty staying on task which affects her productivity - Episodes of  'zoning out.' persist - Symptoms are disruptive to daily activities. - Some improvement noted since starting on Strattera .  Medication side effects and management - Currently taking Strattera  80 mg. - Experienced nausea for the first couple of weeks after starting Strattera , with each episode lasting about four hours; nausea has resolved now - Taking Prozac  40 mg     03/19/2024    7:59 AM 01/30/2024    8:33 AM 01/19/2024   11:41 AM  Depression screen PHQ 2/9  Decreased Interest 0 1 1  Down, Depressed, Hopeless 0 1 1  PHQ - 2 Score 0 2 2  Altered sleeping 2 3 3   Tired, decreased energy 2 3 3   Change in appetite 0 2 2  Feeling bad or failure about yourself  0 0 0  Trouble concentrating 1 0 2  Moving slowly or fidgety/restless 0 0 0  Suicidal thoughts 0 0 0  PHQ-9 Score 5 10 12   Difficult doing work/chores Somewhat difficult Somewhat difficult Somewhat difficult      03/19/2024    7:59 AM 01/19/2024   11:42 AM 12/22/2023    3:39 PM 11/02/2023    3:16 PM  GAD 7 : Generalized Anxiety Score  Nervous, Anxious, on Edge 0 0 1 0  Control/stop worrying 0 2 1 0  Worry too much - different things 0 2 1 0  Trouble relaxing 1 3 2  0  Restless 1 1 0 1  Easily annoyed or irritable 3 1 3 1   Afraid - awful might happen 0 0 0 0  Total GAD 7 Score 5 9 8 2   Anxiety Difficulty Somewhat difficult Somewhat difficult Somewhat difficult Somewhat difficult            ROS As per HPI.    Objective:     BP 107/72    Pulse 69   Temp 98.3 F (36.8 C) (Temporal)   Ht 5' 4 (1.626 m)   Wt 204 lb (92.5 kg)   LMP 11/04/2018   SpO2 97%   BMI 35.02 kg/m  BP Readings from Last 3 Encounters:  03/19/24 107/72  01/30/24 93/61  01/30/24 104/66   Wt Readings from Last 3 Encounters:  03/19/24 204 lb (92.5 kg)  01/30/24 199 lb (90.3 kg)  01/30/24 199 lb (90.3 kg)      Physical Exam Vitals and nursing note reviewed.  Constitutional:      General: She is not in acute distress.    Appearance: She is not ill-appearing, toxic-appearing or diaphoretic.  Pulmonary:     Effort: Pulmonary effort is normal. No respiratory distress.  Skin:    General: Skin is warm and dry.  Neurological:     General: No focal deficit present.     Mental Status: She is alert and oriented to person, place, and time.  Psychiatric:        Mood and Affect: Mood normal.        Behavior: Behavior normal.      No results found for  any visits on 03/19/24.    The 10-year ASCVD risk score (Arnett DK, et al., 2019) is: 0.3%    Assessment & Plan:   Stephanie Dyer was seen today for adhd.  Diagnoses and all orders for this visit:  ADHD (attention deficit hyperactivity disorder), combined type -     atomoxetine  (STRATTERA ) 100 MG capsule; Take 1 capsule (100 mg total) by mouth daily.  Depression, major, recurrent, moderate (HCC)  Generalized anxiety disorder   Assessment and Plan    Attention-deficit hyperactivity disorder, combined type Improvement noted on Straterra 80 mg, but disruptive symptoms persist. Prozac  dose unchanged. - Increased Straterra to 100 mg daily. - Referred to [facility] for medication management with Mliss. - Follow up in one month if not seen by Mliss.     Depression Anxiety Improving with prozac  40 mg. Continue current dosage. Denies SI.    Return in about 4 weeks (around 04/16/2024) for medication follow up.  The patient indicates understanding of these issues and agrees with the  plan.  Stephanie CHRISTELLA Search, FNP "

## 2024-03-20 ENCOUNTER — Telehealth: Payer: Self-pay | Admitting: Family Medicine

## 2024-03-20 NOTE — Telephone Encounter (Signed)
 Referral refaxed Latest Notification  Today12:55:20 PM Stephanie Dyer HERO Delivery Method  Clinical Attachment Action  Fax Generated Fax Number  [phone]

## 2024-03-20 NOTE — Telephone Encounter (Unsigned)
 Copied from CRM #[id number]. Topic: Referral - Question >> Mar 20, 2024 11:46 AM Stephanie Dyer wrote: Reason for CRM: Patient was given a referral for Psychiatry, [facility], but states when she contacted them they did not have the referral, said it was probably sent to their [place] Location. Patient is requesting it to be faxed again to April at fax, [phone]  Patient can be reached at [phone]

## 2024-03-21 ENCOUNTER — Encounter: Payer: Self-pay | Admitting: Physician Assistant

## 2024-03-21 NOTE — Telephone Encounter (Signed)
 Please advise

## 2024-03-21 NOTE — Telephone Encounter (Signed)
 Before switching blood thinners, I would recommend ENT evaluation.  She may have some underlying intranasal vascular abnormalities that could be treated by ENT.  In the meantime, use nasal saline spray.  If having severe (gushing) epistaxis lasting more than 30 minutes, should go to ED.

## 2024-05-02 ENCOUNTER — Ambulatory Visit: Admitting: Family Medicine

## 2024-10-15 ENCOUNTER — Ambulatory Visit: Admitting: Physician Assistant

## 2025-01-14 ENCOUNTER — Inpatient Hospital Stay

## 2025-01-21 ENCOUNTER — Inpatient Hospital Stay: Admitting: Physician Assistant
